# Patient Record
Sex: Female | Born: 1935
Health system: Southern US, Community
[De-identification: ages and names within clinical notes are randomized; demographics above are authoritative.]

## PROBLEM LIST (undated history)

## (undated) DIAGNOSIS — E785 Hyperlipidemia, unspecified: Secondary | ICD-10-CM

## (undated) DIAGNOSIS — M109 Gout, unspecified: Secondary | ICD-10-CM

## (undated) DIAGNOSIS — I10 Essential (primary) hypertension: Secondary | ICD-10-CM

## (undated) DIAGNOSIS — Z8619 Personal history of other infectious and parasitic diseases: Secondary | ICD-10-CM

## (undated) DIAGNOSIS — E119 Type 2 diabetes mellitus without complications: Secondary | ICD-10-CM

## (undated) HISTORY — DX: Gout, unspecified: M10.9

## (undated) HISTORY — DX: Hyperlipidemia, unspecified: E78.5

## (undated) HISTORY — DX: Essential (primary) hypertension: I10

## (undated) HISTORY — PX: TUBAL LIGATION: SHX77

## (undated) HISTORY — PX: CATARACT EXTRACTION, BILATERAL: SHX1313

## (undated) HISTORY — PX: CARPAL TUNNEL RELEASE: SHX101

## (undated) HISTORY — DX: Personal history of other infectious and parasitic diseases: Z86.19

---

## 2005-05-29 ENCOUNTER — Ambulatory Visit: Payer: Self-pay | Admitting: *Deleted

## 2005-05-31 ENCOUNTER — Ambulatory Visit: Payer: Self-pay | Admitting: *Deleted

## 2005-06-05 ENCOUNTER — Ambulatory Visit (HOSPITAL_COMMUNITY): Admission: RE | Admit: 2005-06-05 | Discharge: 2005-06-05 | Payer: Self-pay | Admitting: *Deleted

## 2005-08-08 ENCOUNTER — Ambulatory Visit: Payer: Self-pay | Admitting: *Deleted

## 2005-12-27 ENCOUNTER — Ambulatory Visit: Payer: Self-pay | Admitting: *Deleted

## 2006-02-19 ENCOUNTER — Ambulatory Visit: Payer: Self-pay | Admitting: *Deleted

## 2006-06-17 ENCOUNTER — Inpatient Hospital Stay: Payer: Self-pay | Admitting: Internal Medicine

## 2006-06-17 ENCOUNTER — Other Ambulatory Visit: Payer: Self-pay

## 2006-06-26 ENCOUNTER — Ambulatory Visit: Payer: Self-pay | Admitting: Internal Medicine

## 2008-01-15 DIAGNOSIS — M109 Gout, unspecified: Secondary | ICD-10-CM | POA: Insufficient documentation

## 2008-01-15 DIAGNOSIS — I10 Essential (primary) hypertension: Secondary | ICD-10-CM | POA: Insufficient documentation

## 2008-05-12 ENCOUNTER — Ambulatory Visit: Payer: Self-pay | Admitting: Family Medicine

## 2008-05-12 DIAGNOSIS — M461 Sacroiliitis, not elsewhere classified: Secondary | ICD-10-CM | POA: Insufficient documentation

## 2008-05-12 LAB — HM DEXA SCAN

## 2008-12-11 HISTORY — PX: OTHER SURGICAL HISTORY: SHX169

## 2008-12-21 ENCOUNTER — Ambulatory Visit: Payer: Self-pay | Admitting: Family Medicine

## 2009-06-30 ENCOUNTER — Ambulatory Visit: Payer: Self-pay | Admitting: Family Medicine

## 2010-03-18 DIAGNOSIS — J309 Allergic rhinitis, unspecified: Secondary | ICD-10-CM | POA: Insufficient documentation

## 2011-01-04 ENCOUNTER — Ambulatory Visit: Payer: Self-pay | Admitting: Family Medicine

## 2011-01-08 HISTORY — PX: OTHER SURGICAL HISTORY: SHX169

## 2012-04-17 ENCOUNTER — Emergency Department: Payer: Self-pay | Admitting: Emergency Medicine

## 2012-04-17 LAB — URINALYSIS, COMPLETE
Glucose,UR: NEGATIVE mg/dL (ref 0–75)
Ketone: NEGATIVE
Leukocyte Esterase: NEGATIVE
Nitrite: NEGATIVE
Protein: NEGATIVE
Squamous Epithelial: <1
WBC UR: 1 /HPF (ref 0–5)

## 2015-03-03 DIAGNOSIS — M109 Gout, unspecified: Secondary | ICD-10-CM | POA: Diagnosis not present

## 2015-03-03 DIAGNOSIS — I10 Essential (primary) hypertension: Secondary | ICD-10-CM | POA: Diagnosis not present

## 2015-03-03 DIAGNOSIS — M792 Neuralgia and neuritis, unspecified: Secondary | ICD-10-CM | POA: Diagnosis not present

## 2015-03-03 DIAGNOSIS — E782 Mixed hyperlipidemia: Secondary | ICD-10-CM | POA: Diagnosis not present

## 2015-03-09 DIAGNOSIS — I10 Essential (primary) hypertension: Secondary | ICD-10-CM | POA: Diagnosis not present

## 2015-03-09 DIAGNOSIS — E782 Mixed hyperlipidemia: Secondary | ICD-10-CM | POA: Diagnosis not present

## 2015-03-09 DIAGNOSIS — M109 Gout, unspecified: Secondary | ICD-10-CM | POA: Diagnosis not present

## 2015-03-09 LAB — BASIC METABOLIC PANEL
BUN: 22 mg/dL — AB (ref 4–21)
Creatinine: 1 mg/dL (ref <=1.1)
Glucose: 138 mg/dL
Potassium: 3.5 mmol/L (ref 3.4–5.3)
SODIUM: 143 mmol/L (ref 137–147)

## 2015-03-09 LAB — LIPID PANEL
CHOLESTEROL: 165 mg/dL (ref 0–200)
HDL: 34 mg/dL — AB (ref 35–70)
LDL Cholesterol: 68 mg/dL
Triglycerides: 315 mg/dL — AB (ref 40–160)

## 2015-03-09 LAB — HEPATIC FUNCTION PANEL
ALT: 31 U/L (ref 7–35)
AST: 20 U/L (ref 13–35)

## 2015-06-18 DIAGNOSIS — F4321 Adjustment disorder with depressed mood: Secondary | ICD-10-CM | POA: Insufficient documentation

## 2015-06-18 DIAGNOSIS — M545 Low back pain, unspecified: Secondary | ICD-10-CM | POA: Insufficient documentation

## 2015-06-18 DIAGNOSIS — E876 Hypokalemia: Secondary | ICD-10-CM | POA: Insufficient documentation

## 2015-06-18 DIAGNOSIS — E1140 Type 2 diabetes mellitus with diabetic neuropathy, unspecified: Secondary | ICD-10-CM | POA: Insufficient documentation

## 2015-06-18 DIAGNOSIS — M792 Neuralgia and neuritis, unspecified: Secondary | ICD-10-CM | POA: Insufficient documentation

## 2015-06-22 ENCOUNTER — Encounter: Payer: Self-pay | Admitting: Family Medicine

## 2015-06-22 ENCOUNTER — Ambulatory Visit (INDEPENDENT_AMBULATORY_CARE_PROVIDER_SITE_OTHER): Payer: Commercial Managed Care - HMO | Admitting: Family Medicine

## 2015-06-22 VITALS — BP 124/78 | HR 76 | Temp 98.2°F | Resp 16 | Ht 66.0 in | Wt 176.0 lb

## 2015-06-22 DIAGNOSIS — R739 Hyperglycemia, unspecified: Secondary | ICD-10-CM | POA: Diagnosis not present

## 2015-06-22 DIAGNOSIS — M1000 Idiopathic gout, unspecified site: Secondary | ICD-10-CM | POA: Diagnosis not present

## 2015-06-22 DIAGNOSIS — I10 Essential (primary) hypertension: Secondary | ICD-10-CM | POA: Diagnosis not present

## 2015-06-22 DIAGNOSIS — M8580 Other specified disorders of bone density and structure, unspecified site: Secondary | ICD-10-CM | POA: Diagnosis not present

## 2015-06-22 DIAGNOSIS — R609 Edema, unspecified: Secondary | ICD-10-CM

## 2015-06-22 LAB — POCT GLYCOSYLATED HEMOGLOBIN (HGB A1C)
ESTIMATED AVERAGE GLUCOSE: 128
Hemoglobin A1C: 6.1

## 2015-06-22 MED ORDER — COLCHICINE 0.6 MG PO TABS: ORAL_TABLET | ORAL | Status: DC

## 2015-06-22 MED ORDER — METOPROLOL SUCCINATE ER 50 MG PO TB24: 50.0000 mg | ORAL_TABLET | Freq: Every day | ORAL | Status: DC

## 2015-06-22 NOTE — Progress Notes (Signed)
Patient: Teresa Shields Female    DOB: [birth year]   79 y.o.   MRN: [id number] Visit Date: 06/22/2015  Today's Provider: Mila Merryonald Lyrick Lagrand, MD   Chief Complaint  Patient presents with  . Hyperglycemia  . Hypertension  . Gout   Subjective:    HPI      Hypertension, follow-up:  BP Readings from Last 3 Encounters:  06/22/15 124/78  06/18/15 138/70    She was last seen for hypertension 3 months ago.  BP at that visit was 138/70. Management changes since that visit include increasing Triamterene/HCTZ to 1 full pill per day. She reports good compliance with treatment. She is not having side effects.  She is exercising (Walking). She is adherent to low salt diet.   Outside blood pressures are 110's-130's/70's-80's. She is experiencing lower extremity edema.  Patient denies chest pain, chest pressure/discomfort, claudication, dyspnea, exertional chest pressure/discomfort, fatigue, irregular heart beat, near-syncope, orthopnea, palpitations, syncope and tachypnea.   Cardiovascular risk factors include advanced age (older than 455 for men, 4765 for women), dyslipidemia and hypertension.  Use of agents associated with hypertension: none.     Weight trend: stable Wt Readings from Last 3 Encounters:  06/22/15 176 lb (79.833 kg)  06/18/15 174 lb (78.926 kg)    Current diet: in general, a "healthy" diet    ------------------------------------------------------------------------  Follow up Hyperglycemia  The patient was last seen for this 3 months ago. Changes made at last visit include advising pt to avoid starchy foods and sweets. Pt's glucose was elevated at 138. Will check HgbA1C today.  ------------------------------------------------------------------------------------ Gout:  Patient here for evaluation of acute gouty arthritis. The patient reports 3 attacks involving the right foot. Attacks occur primarily in the right foot. Patient reports her chronic pain is  unchanged, her joint stiffness is unchanged and her joint swelling is unchanged. Limitation on activities include difficulty with walking. The patient is avoiding high purine foods and reports consuming 0 alcoholic drinks per month. Pt reports she has about 3 gout attacks since increasing her diuretic.     Allergies  Allergen Reactions  . Naproxen Diarrhea   Previous Medications   ALLOPURINOL (ZYLOPRIM) 300 MG TABLET    Take 1 tablet by mouth daily.   ASCORBIC ACID (VITAMIN C) 100 MG TABLET    Take 1 tablet by mouth daily.   ASPIRIN 81 MG TABLET    Take 1 tablet by mouth daily.   ATORVASTATIN (LIPITOR) 40 MG TABLET    Take 1 tablet by mouth at bedtime.   CALCIUM-VITAMIN D (OSCAL 500/200 D-3) 500-200 MG-UNIT PER TABLET    Take 2 tablets by mouth daily.   GABAPENTIN (NEURONTIN) 100 MG CAPSULE    Take 1 capsule by mouth at bedtime.   METOPROLOL SUCCINATE (TOPROL XL) 50 MG 24 HR TABLET    Take 1 tablet by mouth daily. For blood pressure   MULTIPLE VITAMIN PO    Take 1 tablet by mouth daily.   TRIAMTERENE-HYDROCHLOROTHIAZIDE (MAXZIDE) 75-50 MG PER TABLET    Take 1 tablet by mouth daily.   VITAMIN E 400 UNITS TABS    Take 1 tablet by mouth daily.    Review of Systems  Constitutional: Negative for fever, chills, diaphoresis, activity change, appetite change, fatigue and unexpected weight change.  Respiratory: Negative for cough, chest tightness, shortness of breath and wheezing.   Cardiovascular: Positive for leg swelling. Negative for chest pain and palpitations.  Endocrine: Negative for polydipsia, polyphagia and polyuria.  History  Substance Use Topics  . Smoking status: Never Smoker   . Smokeless tobacco: Never Used  . Alcohol Use: No   Objective:   BP 124/78 mmHg  Pulse 76  Temp(Src) 98.2 F (36.8 C) (Oral)  Resp 16  Ht  (1.676 m)  Wt 176 lb (79.833 kg)  BMI 28.42 kg/m2  Physical Exam   General Appearance:    Alert, cooperative, no distress  Eyes:    PERRL,  conjunctiva/corneas clear, EOM's intact       Lungs:     Clear to auscultation bilaterally, respirations unlabored  Heart:    Regular rate and rhythm  Neurologic:   Awake, alert, oriented x 3. No apparent focal neurological           defect.       Results for orders placed or performed in visit on 06/22/15  POCT glycosylated hemoglobin (Hb A1C)  Result Value Ref Range   Hemoglobin A1C 6.1    Est. average glucose Bld gHb Est-mCnc 128        Assessment & Plan:      1. Hypertension, essential, benign Well controlled on current medications.  - metoprolol succinate (TOPROL XL) 50 MG 24 hr tablet; Take 1 tablet (50 mg total) by mouth daily. For blood pressure  Dispense: 90 tablet; Refill: 3  2. Hyperglycemia well controlled . - POCT glycosylated hemoglobin (Hb A1C)  3. Edema Improved since increasing diuretic.   4. Osteopenia   5. Idiopathic gout, unspecified chronicity, unspecified site A few minor flares since increasing diuretic. Continue allopurinol.  - colchicine 0.6 MG tablet; 2 tablets at first sign of gout, then one daily as needed  Dispense: 90 tablet; Refill: 1    Mila Merry, MD  [facility]

## 2015-06-27 ENCOUNTER — Encounter: Payer: Self-pay | Admitting: Family Medicine

## 2015-06-27 DIAGNOSIS — M8580 Other specified disorders of bone density and structure, unspecified site: Secondary | ICD-10-CM | POA: Insufficient documentation

## 2015-08-12 ENCOUNTER — Telehealth: Payer: Self-pay | Admitting: Family Medicine

## 2015-08-12 MED ORDER — GABAPENTIN 100 MG PO CAPS: 100.0000 mg | ORAL_CAPSULE | Freq: Every day | ORAL | Status: DC

## 2015-08-12 NOTE — Telephone Encounter (Signed)
rx refill only

## 2015-09-30 ENCOUNTER — Ambulatory Visit (INDEPENDENT_AMBULATORY_CARE_PROVIDER_SITE_OTHER): Payer: Commercial Managed Care - HMO

## 2015-09-30 DIAGNOSIS — Z23 Encounter for immunization: Secondary | ICD-10-CM

## 2015-12-29 ENCOUNTER — Ambulatory Visit (INDEPENDENT_AMBULATORY_CARE_PROVIDER_SITE_OTHER): Payer: Commercial Managed Care - HMO | Admitting: Family Medicine

## 2015-12-29 ENCOUNTER — Encounter: Payer: Self-pay | Admitting: Family Medicine

## 2015-12-29 VITALS — BP 146/60 | HR 76 | Temp 98.2°F | Resp 16 | Ht 66.0 in | Wt 177.0 lb

## 2015-12-29 DIAGNOSIS — I10 Essential (primary) hypertension: Secondary | ICD-10-CM

## 2015-12-29 DIAGNOSIS — M109 Gout, unspecified: Secondary | ICD-10-CM

## 2015-12-29 DIAGNOSIS — E2839 Other primary ovarian failure: Secondary | ICD-10-CM

## 2015-12-29 DIAGNOSIS — M10079 Idiopathic gout, unspecified ankle and foot: Secondary | ICD-10-CM

## 2015-12-29 DIAGNOSIS — R739 Hyperglycemia, unspecified: Secondary | ICD-10-CM

## 2015-12-29 LAB — POCT GLYCOSYLATED HEMOGLOBIN (HGB A1C)
Est. average glucose Bld gHb Est-mCnc: 134
Hemoglobin A1C: 6.3

## 2015-12-29 MED ORDER — INDOMETHACIN 25 MG PO CAPS: 25.0000 mg | ORAL_CAPSULE | Freq: Two times a day (BID) | ORAL | Status: DC

## 2015-12-29 NOTE — Progress Notes (Signed)
Patient: Teresa Shields Female    DOB: [birth year]   80 y.o.   MRN: [id number] Visit Date: 12/29/2015  Today's Provider: Mila Merry, MD   Chief Complaint  Patient presents with  . Hypertension    follow up  . Hyperglycemia    follow up  . Gout    follow up   Subjective:    HPI   Hypertension, follow-up:  BP Readings from Last 3 Encounters:  06/22/15 124/78  06/18/15 138/70    She was last seen for hypertension 6 months ago.  BP at that visit was  124/78. Management since that visit includes no changes. She reports good compliance with treatment. She is not having side effects.  She is exercising. Walking. She is adherent to low salt diet.   Outside blood pressures are 130's/ 60-70's. She is experiencing lower extremity edema.  Patient denies chest pain, chest pressure/discomfort, claudication, dyspnea, exertional chest pressure/discomfort, fatigue, irregular heart beat, orthopnea, palpitations, paroxysmal nocturnal dyspnea, syncope and tachypnea.   Cardiovascular risk factors include advanced age (older than 35 for men, 53 for women) and hypertension.  Use of agents associated with hypertension: NSAIDS.     Weight trend: stable Wt Readings from Last 3 Encounters:  06/22/15 176 lb (79.833 kg)  06/18/15 174 lb (78.926 kg)    Current diet: unhealthy diet  ------------------------------------------------------------------------   Hyperglycemia, Follow-up:   Lab Results  Component Value Date   HGBA1C 6.1 06/22/2015    Last seen for for this 6 months ago.  Management since then includes no changes. Current symptoms include none and have been stable.  Weight trend: stable Prior visit with dietician: no Current diet: in general, an "unhealthy" diet Current exercise: walking  Pertinent Labs:    Component Value Date/Time   CHOL 165 03/09/2015   TRIG 315* 03/09/2015   CREATININE 1.0 03/09/2015    Wt Readings from Last 3 Encounters:  06/22/15  176 lb (79.833 kg)  06/18/15 174 lb (78.926 kg)    Follow up Gout:  Last office visit was 6 months ago and no changes were made. Patient was to continue Colchicine. Patient comes in today stating she has had a gout flare up for the past 3 weeks. Patient states she has swelling in her right lower leg. There was pain in her leg at the onset of the gout flare which has now resolved. Patient has been taking the medications as prescribed. She feels the Colchine is not helping to control gout and also states it is not covered by her insurance.       Allergies  Allergen Reactions  . Naproxen Diarrhea   Previous Medications   ALLOPURINOL (ZYLOPRIM) 300 MG TABLET    Take 1 tablet by mouth daily.   ASCORBIC ACID (VITAMIN C) 100 MG TABLET    Take 1 tablet by mouth daily.   ASPIRIN 81 MG TABLET    Take 1 tablet by mouth daily.   ATORVASTATIN (LIPITOR) 40 MG TABLET    Take 1 tablet by mouth at bedtime.   CALCIUM-VITAMIN D (OSCAL 500/200 D-3) 500-200 MG-UNIT PER TABLET    Take 2 tablets by mouth daily.   COLCHICINE 0.6 MG TABLET    2 tablets at first sign of gout, then one daily as needed   GABAPENTIN (NEURONTIN) 100 MG CAPSULE    Take 1 capsule (100 mg total) by mouth at bedtime.   METOPROLOL SUCCINATE (TOPROL XL) 50 MG 24 HR TABLET  Take 1 tablet (50 mg total) by mouth daily. For blood pressure   MULTIPLE VITAMIN PO    Take 1 tablet by mouth daily.   TRIAMTERENE-HYDROCHLOROTHIAZIDE (MAXZIDE) 75-50 MG PER TABLET    Take 1 tablet by mouth daily.   VITAMIN E 400 UNITS TABS    Take 1 tablet by mouth daily.    Review of Systems  Constitutional: Negative for fever, chills, appetite change and fatigue.  Respiratory: Negative for chest tightness and shortness of breath.   Cardiovascular: Positive for leg swelling. Negative for chest pain and palpitations.  Gastrointestinal: Negative for nausea, vomiting and abdominal pain.  Endocrine: Negative for cold intolerance, heat intolerance, polydipsia,  polyphagia and polyuria.  Neurological: Negative for dizziness and weakness.    Social History  Substance Use Topics  . Smoking status: Never Smoker   . Smokeless tobacco: Never Used  . Alcohol Use: No   Objective:   BP 146/60 mmHg  Pulse 76  Temp(Src) 98.2 F (36.8 C) (Oral)  Resp 16  Ht  (1.676 m)  Wt 177 lb (80.287 kg)  BMI 28.58 kg/m2  SpO2 96%  Physical Exam   General Appearance:    Alert, cooperative, no distress  Eyes:    PERRL, conjunctiva/corneas clear, EOM's intact       Lungs:     Clear to auscultation bilaterally, respirations unlabored  Heart:    Regular rate and rhythm  Neurologic:   Awake, alert, oriented x 3. No apparent focal neurological           defect.       Results for orders placed or performed in visit on 12/29/15  POCT HgB A1C  Result Value Ref Range   Hemoglobin A1C 6.3    Est. average glucose Bld gHb Est-mCnc 134        Assessment & Plan:     1. Hyperglycem9a Well controlled.  Continue current medications.   - POCT HgB A1C  2. Hypertension, essential, benign Well controlled.  Continue current medications.    3. Gout of foot, unspecified cause, unspecified chronicity, unspecified laterality  - indomethacin (INDOCIN) 25 MG capsule; Take 1-2 capsules (25-50 mg total) by mouth 2 (two) times daily with a meal. For gout  Dispense: 30 capsule; Refill: 1  Consider ortho referral if not rapidly improving  4. Estrogen deficiency  - DG Bone Density; Future       Mila Merry, MD  [facility]

## 2016-01-12 ENCOUNTER — Other Ambulatory Visit: Payer: Self-pay | Admitting: Family Medicine

## 2016-01-25 ENCOUNTER — Ambulatory Visit (INDEPENDENT_AMBULATORY_CARE_PROVIDER_SITE_OTHER): Payer: Commercial Managed Care - HMO | Admitting: Family Medicine

## 2016-01-25 ENCOUNTER — Encounter: Payer: Self-pay | Admitting: Family Medicine

## 2016-01-25 VITALS — BP 112/52 | HR 84 | Temp 98.0°F | Resp 18 | Wt 175.0 lb

## 2016-01-25 DIAGNOSIS — J069 Acute upper respiratory infection, unspecified: Secondary | ICD-10-CM

## 2016-01-25 MED ORDER — AZITHROMYCIN 250 MG PO TABS: ORAL_TABLET | ORAL | Status: AC

## 2016-01-25 NOTE — Patient Instructions (Addendum)
Take OTC Mucinex (guaifenesin) and Delsym for cough and  chest congestion  Start prescription for azithromycin if you develop fever above 101, feel short of breath, or if not getting better within 7 days.     Upper Respiratory Infection, Adult Most upper respiratory infections (URIs) are a viral infection of the air passages leading to the lungs. A URI affects the nose, throat, and upper air passages. The most common type of URI is nasopharyngitis and is typically referred to as "the common cold." URIs run their course and usually go away on their own. Most of the time, a URI does not require medical attention, but sometimes a bacterial infection in the upper airways can follow a viral infection. This is called a secondary infection. Sinus and middle ear infections are common types of secondary upper respiratory infections. Bacterial pneumonia can also complicate a URI. A URI can worsen asthma and chronic obstructive pulmonary disease (COPD). Sometimes, these complications can require emergency medical care and may be life threatening.  CAUSES Almost all URIs are caused by viruses. A virus is a type of germ and can spread from one person to another.  RISKS FACTORS You may be at risk for a URI if:   You smoke.   You have chronic heart or lung disease.  You have a weakened defense (immune) system.   You are very young or very old.   You have nasal allergies or asthma.  You work in crowded or poorly ventilated areas.  You work in health care facilities or schools. SIGNS AND SYMPTOMS  Symptoms typically develop 2-3 days after you come in contact with a cold virus. Most viral URIs last 7-10 days. However, viral URIs from the influenza virus (flu virus) can last 14-18 days and are typically more severe. Symptoms may include:   Runny or stuffy (congested) nose.   Sneezing.   Cough.   Sore throat.   Headache.   Fatigue.   Fever.   Loss of appetite.   Pain in your  forehead, behind your eyes, and over your cheekbones (sinus pain).  Muscle aches.  DIAGNOSIS  Your health care provider may diagnose a URI by:  Physical exam.  Tests to check that your symptoms are not due to another condition such as:  Strep throat.  Sinusitis.  Pneumonia.  Asthma. TREATMENT  A URI goes away on its own with time. It cannot be cured with medicines, but medicines may be prescribed or recommended to relieve symptoms. Medicines may help:  Reduce your fever.  Reduce your cough.  Relieve nasal congestion. HOME CARE INSTRUCTIONS   Take medicines only as directed by your health care provider.   Gargle warm saltwater or take cough drops to comfort your throat as directed by your health care provider.  Use a warm mist humidifier or inhale steam from a shower to increase air moisture. This may make it easier to breathe.  Drink enough fluid to keep your urine clear or pale yellow.   Eat soups and other clear broths and maintain good nutrition.   Rest as needed.   Return to work when your temperature has returned to normal or as your health care provider advises. You may need to stay home longer to avoid infecting others. You can also use a face mask and careful hand washing to prevent spread of the virus.  Increase the usage of your inhaler if you have asthma.   Do not use any tobacco products, including cigarettes, chewing tobacco, or electronic  cigarettes. If you need help quitting, ask your health care provider. PREVENTION  The best way to protect yourself from getting a cold is to practice good hygiene.   Avoid oral or hand contact with people with cold symptoms.   Wash your hands often if contact occurs.  There is no clear evidence that vitamin C, vitamin E, echinacea, or exercise reduces the chance of developing a cold. However, it is always recommended to get plenty of rest, exercise, and practice good nutrition.  SEEK MEDICAL CARE IF:   You  are getting worse rather than better.   Your symptoms are not controlled by medicine.   You have chills.  You have worsening shortness of breath.  You have brown or red mucus.  You have yellow or brown nasal discharge.  You have pain in your face, especially when you bend forward.  You have a fever.  You have swollen neck glands.  You have pain while swallowing.  You have white areas in the back of your throat. SEEK IMMEDIATE MEDICAL CARE IF:   You have severe or persistent:  Headache.  Ear pain.  Sinus pain.  Chest pain.  You have chronic lung disease and any of the following:  Wheezing.  Prolonged cough.  Coughing up blood.  A change in your usual mucus.  You have a stiff neck.  You have changes in your:  Vision.  Hearing.  Thinking.  Mood. MAKE SURE YOU:   Understand these instructions.  Will watch your condition.  Will get help right away if you are not doing well or get worse.   This information is not intended to replace advice given to you by your health care provider. Make sure you discuss any questions you have with your health care provider.   Document Released: 05/23/2001 Document Revised: 04/13/2015 Document Reviewed: 03/04/2014 Elsevier Interactive Patient Education [employer].

## 2016-01-25 NOTE — Progress Notes (Signed)
Patient: Teresa Shields Female    DOB: Feb 09, 1936   80 y.o.   MRN: [id number] Visit Date: 01/25/2016  Today's Provider: Mila Merry, MD   Chief Complaint  Patient presents with  . Cough   Subjective:    Cough This is a new problem. Episode onset: 2 days ago. The problem has been gradually worsening. The problem occurs constantly. The cough is non-productive. Associated symptoms include chills, rhinorrhea, shortness of breath and wheezing. Pertinent negatives include no chest pain, ear congestion, ear pain, eye redness, fever, headaches, heartburn, myalgias, nasal congestion, postnasal drip, rash, sore throat or sweats. Nothing aggravates the symptoms. Treatments tried: CorIcidin HBP. The treatment provided no relief.       Allergies  Allergen Reactions  . Naproxen Diarrhea   Previous Medications   ALLOPURINOL (ZYLOPRIM) 300 MG TABLET    Take 1 tablet by mouth daily.   ASCORBIC ACID (VITAMIN C) 100 MG TABLET    Take 1 tablet by mouth daily.   ASPIRIN 81 MG TABLET    Take 1 tablet by mouth daily.   ATORVASTATIN (LIPITOR) 40 MG TABLET    Take 1 tablet by mouth at bedtime.   CALCIUM-VITAMIN D (OSCAL 500/200 D-3) 500-200 MG-UNIT PER TABLET    Take 2 tablets by mouth daily.   COLCHICINE 0.6 MG TABLET    2 tablets at first sign of gout, then one daily as needed   GABAPENTIN (NEURONTIN) 100 MG CAPSULE    Take 1 capsule (100 mg total) by mouth at bedtime.   INDOMETHACIN (INDOCIN) 25 MG CAPSULE    Take 1-2 capsules (25-50 mg total) by mouth 2 (two) times daily with a meal. For gout   METOPROLOL SUCCINATE (TOPROL XL) 50 MG 24 HR TABLET    Take 1 tablet (50 mg total) by mouth daily. For blood pressure   MULTIPLE VITAMIN PO    Take 1 tablet by mouth daily.   TRIAMTERENE-HYDROCHLOROTHIAZIDE (MAXZIDE) 75-50 MG TABLET    TAKE 1 TABLET EVERY DAY   VITAMIN E 400 UNITS TABS    Take 1 tablet by mouth daily.    Review of Systems  Constitutional: Positive for chills and fatigue. Negative  for fever and appetite change.  HENT: Positive for congestion and rhinorrhea. Negative for ear discharge, ear pain, mouth sores, nosebleeds, postnasal drip, sinus pressure, sneezing and sore throat.   Eyes: Negative for photophobia, pain, discharge, redness, itching and visual disturbance.  Respiratory: Positive for cough, shortness of breath and wheezing. Negative for chest tightness.   Cardiovascular: Negative for chest pain and palpitations.  Gastrointestinal: Negative for heartburn, nausea, vomiting and abdominal pain.  Musculoskeletal: Negative for myalgias.  Skin: Negative for rash.  Neurological: Positive for weakness and light-headedness. Negative for dizziness and headaches.    Social History  Substance Use Topics  . Smoking status: Never Smoker   . Smokeless tobacco: Never Used  . Alcohol Use: No   Objective:   BP 112/52 mmHg  Pulse 84  Temp(Src) 98 F (36.7 C) (Oral)  Resp 18  Wt 175 lb (79.379 kg)  SpO2 98%  Physical Exam   General Appearance:    Alert, cooperative, no distress  HENT:   bilateral TM normal without fluid or infection, neck without nodes, throat normal without erythema or exudate, sinuses nontender and nasal mucosa pale and congested  Eyes:    PERRL, conjunctiva/corneas clear, EOM's intact       Lungs:     Clear to auscultation  bilaterally, respirations unlabored  Heart:    Regular rate and rhythm  Neurologic:   Awake, alert, oriented x 3. No apparent focal neurological           defect.            Assessment & Plan:     1. Upper respiratory infection Counseled regarding signs and symptoms of viral and bacterial respiratory infections. Advised to fill prescription for Azithromycin if she develops any sign of bacterial infection, or if current symptoms last longer than 10 days.         Mila Merry, MD  [facility]

## 2016-02-07 ENCOUNTER — Other Ambulatory Visit: Payer: Self-pay | Admitting: Family Medicine

## 2016-02-08 ENCOUNTER — Ambulatory Visit: Payer: Self-pay

## 2016-03-25 ENCOUNTER — Other Ambulatory Visit: Payer: Self-pay | Admitting: Family Medicine

## 2016-06-12 ENCOUNTER — Other Ambulatory Visit: Payer: Self-pay | Admitting: Family Medicine

## 2016-06-28 ENCOUNTER — Ambulatory Visit: Payer: Commercial Managed Care - HMO | Admitting: Family Medicine

## 2016-07-04 ENCOUNTER — Ambulatory Visit (INDEPENDENT_AMBULATORY_CARE_PROVIDER_SITE_OTHER): Payer: Commercial Managed Care - HMO | Admitting: Family Medicine

## 2016-07-04 ENCOUNTER — Encounter: Payer: Self-pay | Admitting: Family Medicine

## 2016-07-04 VITALS — BP 120/64 | HR 74 | Temp 97.7°F | Resp 16 | Ht 66.0 in | Wt 174.0 lb

## 2016-07-04 DIAGNOSIS — M2550 Pain in unspecified joint: Secondary | ICD-10-CM

## 2016-07-04 DIAGNOSIS — R739 Hyperglycemia, unspecified: Secondary | ICD-10-CM

## 2016-07-04 DIAGNOSIS — M10079 Idiopathic gout, unspecified ankle and foot: Secondary | ICD-10-CM | POA: Diagnosis not present

## 2016-07-04 DIAGNOSIS — M109 Gout, unspecified: Secondary | ICD-10-CM

## 2016-07-04 DIAGNOSIS — R600 Localized edema: Secondary | ICD-10-CM

## 2016-07-04 DIAGNOSIS — I10 Essential (primary) hypertension: Secondary | ICD-10-CM

## 2016-07-04 DIAGNOSIS — R609 Edema, unspecified: Secondary | ICD-10-CM | POA: Insufficient documentation

## 2016-07-04 LAB — POCT GLYCOSYLATED HEMOGLOBIN (HGB A1C)
Est. average glucose Bld gHb Est-mCnc: 140
Hemoglobin A1C: 6.5

## 2016-07-04 NOTE — Progress Notes (Signed)
Patient: Teresa Shields Female    DOB: [birth year]   80 y.o.   MRN: [id number] Visit Date: 07/04/2016  Today's Provider: Lelon Huh, MD   Chief Complaint  Patient presents with  . Follow-up  . Hypertension  . Gout  . Hyperglycemia   Subjective:    HPI  Estrogen deficiency: From 12/29/2015-DG Bone Density; ordered. No report in epic that patient had this done.  Gout of foot, unspecified cause, unspecified chronicity, unspecified laterality: From 12/29/2015-started indomethacin (INDOCIN) 25 MG capsule. States has been having gout flares about every 3 weeks.   Hyperglycemia: From 12/29/2015-Well controlled.  Continue current medications.      Hypertension, follow-up:  BP Readings from Last 3 Encounters:  07/04/16 120/64  01/25/16 (!) 112/52  12/29/15 (!) 146/60    She was last seen for hypertension 6 months ago.  BP at that visit was 146/60. Management since that visit includes; no changes.She reports good compliance with treatment. She is not having side effects. none She is exercising. She is adherent to low salt diet.   Outside blood pressures are 106/70. She is experiencing none.  Patient denies none.   Cardiovascular risk factors include none.  Use of agents associated with hypertension: none.   ----------------------------------------------------------------    Bilateral hand pain for 1 1/2 months. Hands feel oily all the time. Has a hard time opening things with her hands due to pain with tight gripping. No swelling.    Allergies  Allergen Reactions  . Naproxen Diarrhea   Current Meds  Medication Sig  . allopurinol (ZYLOPRIM) 300 MG tablet TAKE 1 TABLET EVERY DAY  . Ascorbic Acid (VITAMIN C) 100 MG tablet Take 1 tablet by mouth daily.  Marland Kitchen aspirin 81 MG tablet Take 1 tablet by mouth daily.  Marland Kitchen atorvastatin (LIPITOR) 40 MG tablet TAKE 1 TABLET AT BEDTIME FOR CHOLESTEROL  . calcium-vitamin D (OSCAL 500/200 D-3) 500-200 MG-UNIT per tablet Take 2  tablets by mouth daily.  . colchicine 0.6 MG tablet 2 tablets at first sign of gout, then one daily as needed  . gabapentin (NEURONTIN) 100 MG capsule Take 1 capsule (100 mg total) by mouth at bedtime.  . indomethacin (INDOCIN) 25 MG capsule Take 1-2 capsules (25-50 mg total) by mouth 2 (two) times daily with a meal. For gout  . metoprolol succinate (TOPROL-XL) 50 MG 24 hr tablet TAKE 1 TABLET EVERY DAY FOR BLOOD PRESSURE  . MULTIPLE VITAMIN PO Take 1 tablet by mouth daily.  Marland Kitchen triamterene-hydrochlorothiazide (MAXZIDE) 75-50 MG tablet TAKE 1 TABLET EVERY DAY  . Vitamin E 400 UNITS TABS Take 1 tablet by mouth daily.    Review of Systems  Constitutional: Negative for appetite change, chills, fatigue and fever.  Respiratory: Negative for chest tightness and shortness of breath.   Cardiovascular: Negative for chest pain and palpitations.  Gastrointestinal: Negative for abdominal pain, nausea and vomiting.  Neurological: Negative for dizziness and weakness.    Social History  Substance Use Topics  . Smoking status: Never Smoker  . Smokeless tobacco: Never Used  . Alcohol use No   Objective:   BP 120/64 (BP Location: Left Arm, Patient Position: Sitting, Cuff Size: Large)   Pulse 74   Temp 97.7 F (36.5 C) (Oral)   Resp 16   Ht 5' 6" (1.676 m)   Wt 174 lb (78.9 kg)   SpO2 98%   BMI 28.08 kg/m   Physical Exam   General Appearance:    Alert, cooperative,  no distress  Eyes:    PERRL, conjunctiva/corneas clear, EOM's intact       Lungs:     Clear to auscultation bilaterally, respirations unlabored  Heart:    Regular rate and rhythm. Trace bipedal edema  Neurologic:   Awake, alert, oriented x 3. No apparent focal neurological           defect.   MS:     Slight tenderness palmer aspect fingers and hand. No swelling. No erythema. No other gross deformities.      Results for orders placed or performed in visit on 07/04/16  POCT glycosylated hemoglobin (Hb A1C)  Result Value Ref  Range   Hemoglobin A1C 6.5    Est. average glucose Bld gHb Est-mCnc 140        Assessment & Plan:     1. Hyperglycemia Borderline diabetes. Continue avoiding sweets in diet and check a1c 2-3 times a year.  - POCT glycosylated hemoglobin (Hb A1C)  2. Hypertension, essential, benign Well controlled.  Consider stopping maxzide which is likely increasing uric acid levels.  - Renal function panel  3. Arthralgia  - Sed Rate (ESR) - ANA w/Reflex - Rheumatoid factor  4. Gout of foot, unspecified cause, unspecified chronicity, unspecified laterality Persistent. If uric acid levels up will likely benefit from stopping maxzide - Uric acid  5. Edema Well controlled on Maxzide, but this medication is likely aggravating gout. Consider change to prn furosemide.     The entirety of the information documented in the History of Present Illness, Review of Systems and Physical Exam were personally obtained by me. Portions of this information were initially documented by Roshena Chambers, CMA and reviewed by me for thoroughness and accuracy.     , MD  [facility]  

## 2016-07-05 LAB — RENAL FUNCTION PANEL
Albumin: 4.3 g/dL (ref 3.5–4.8)
BUN / CREAT RATIO: 18 (ref 12–28)
BUN: 18 mg/dL (ref 8–27)
CALCIUM: 9.7 mg/dL (ref 8.7–10.3)
CO2: 26 mmol/L (ref 18–29)
CREATININE: 1.02 mg/dL — AB (ref 0.57–1.00)
Chloride: 99 mmol/L (ref 96–106)
GFR, EST AFRICAN AMERICAN: 61 mL/min/{1.73_m2} (ref >59)
GFR, EST NON AFRICAN AMERICAN: 53 mL/min/{1.73_m2} — AB (ref >59)
Glucose: 127 mg/dL — ABNORMAL HIGH (ref 65–99)
Phosphorus: 2.7 mg/dL (ref 2.5–4.5)
Potassium: 3.9 mmol/L (ref 3.5–5.2)
SODIUM: 147 mmol/L — AB (ref 134–144)

## 2016-07-05 LAB — URIC ACID: Uric Acid: 4.8 mg/dL (ref 2.5–7.1)

## 2016-07-05 LAB — ANA W/REFLEX: Anti Nuclear Antibody(ANA): NEGATIVE

## 2016-07-05 LAB — RHEUMATOID FACTOR: Rhuematoid fact SerPl-aCnc: <10 IU/mL (ref 0.0–13.9)

## 2016-07-05 LAB — SEDIMENTATION RATE: Sed Rate: 5 mm/hr (ref 0–40)

## 2016-07-12 ENCOUNTER — Telehealth: Payer: Self-pay | Admitting: *Deleted

## 2016-07-12 DIAGNOSIS — R601 Generalized edema: Secondary | ICD-10-CM

## 2016-07-12 MED ORDER — FUROSEMIDE 20 MG PO TABS: 20.0000 mg | ORAL_TABLET | Freq: Every day | ORAL | 1 refills | Status: DC | PRN

## 2016-07-12 MED ORDER — MELOXICAM 7.5 MG PO TABS: 7.5000 mg | ORAL_TABLET | Freq: Every day | ORAL | 1 refills | Status: DC

## 2016-07-12 NOTE — Telephone Encounter (Signed)
Patient was notified of results. Patient expressed understanding. Rx sent to pharmacy.  

## 2016-07-12 NOTE — Telephone Encounter (Signed)
She can stop triamterene/hctz and start taking furosemide only as needed for swelling. Have sent rx to her pharmacy. She should only need to take it 3 or 4 times a week.  Schedule follow up in 6-7 weeks to check on blood pressure.

## 2016-07-12 NOTE — Telephone Encounter (Signed)
Patient was notified. Patient expressed understanding. Follow-up appt scheduled.

## 2016-07-12 NOTE — Telephone Encounter (Signed)
Patient wanted to know if you are going to change her HCTZ due to edema, as discussed at her ov or if she is to continue with medication for now? Please advise?

## 2016-07-12 NOTE — Telephone Encounter (Signed)
-----   Message from [name], MD sent at 07/12/2016  1:52 PM EDT ----- Labs are normal, no sign of inflammatory arthritis. Recommend she try meloxicam 7.5mg  once a day for pains in hands. #30, rf x1. If this does not help within 2 weeks then call back for referral to rheumatology.

## 2016-08-11 ENCOUNTER — Ambulatory Visit: Payer: Self-pay | Admitting: Family Medicine

## 2016-09-19 ENCOUNTER — Encounter: Payer: Self-pay | Admitting: Family Medicine

## 2016-09-19 ENCOUNTER — Ambulatory Visit (INDEPENDENT_AMBULATORY_CARE_PROVIDER_SITE_OTHER): Payer: Commercial Managed Care - HMO | Admitting: Family Medicine

## 2016-09-19 VITALS — BP 132/70 | HR 76 | Temp 97.9°F | Resp 16 | Wt 173.0 lb

## 2016-09-19 DIAGNOSIS — Z23 Encounter for immunization: Secondary | ICD-10-CM | POA: Diagnosis not present

## 2016-09-19 DIAGNOSIS — R601 Generalized edema: Secondary | ICD-10-CM

## 2016-09-19 DIAGNOSIS — I10 Essential (primary) hypertension: Secondary | ICD-10-CM

## 2016-09-19 NOTE — Progress Notes (Signed)
Patient: Teresa Shields Female    DOB: 09-Jun-1936   80 y.o.   MRN: [id number] Visit Date: 09/19/2016  Today's Provider: Mila Merry, MD   Chief Complaint  Patient presents with  . Hypertension   Subjective:    HPI  Hypertension, follow-up:  BP Readings from Last 3 Encounters:  07/04/16 120/64  01/25/16 (!) 112/52  12/29/15 (!) 146/60    She was last seen for hypertension 2 months ago.  BP at that visit was 120/64. Management since that visit includes stopping Triamterene-HCTZ due to elevated uric acid and started taking Furosemide only as needed for edema. She reports poor compliance with treatment. Patient states after she stopped taking the Triamterene-HCTZ she started having swelling in both ankles and her blood pressure stayed elevated. Since then patient has started back taking Triamterene- HCTZ and has stopped taking Lasix. She is having side effects elevated blood pressure and swelling. She is exercising. She is adherent to low salt diet.   Outside blood pressures are 133/81 this morning at home. She is experiencing lower extremity edema.  Patient denies chest pain, chest pressure/discomfort, claudication, dyspnea, exertional chest pressure/discomfort, fatigue, irregular heart beat, near-syncope, orthopnea, palpitations, paroxysmal nocturnal dyspnea, syncope and tachypnea.   Cardiovascular risk factors include advanced age (older than 43 for men, 34 for women) and hypertension.  Use of agents associated with hypertension: NSAIDS.     Weight trend: stable Wt Readings from Last 3 Encounters:  07/04/16 174 lb (78.9 kg)  01/25/16 175 lb (79.4 kg)  12/29/15 177 lb (80.3 kg)    Current diet: well balanced  ------------------------------------------------------------------------     Allergies  Allergen Reactions  . Naproxen Diarrhea     Current Outpatient Prescriptions:  .  allopurinol (ZYLOPRIM) 300 MG tablet, TAKE 1 TABLET EVERY DAY, Disp: 90  tablet, Rfl: 3 .  Ascorbic Acid (VITAMIN C) 100 MG tablet, Take 1 tablet by mouth daily., Disp: , Rfl:  .  aspirin 81 MG tablet, Take 1 tablet by mouth daily., Disp: , Rfl:  .  atorvastatin (LIPITOR) 40 MG tablet, TAKE 1 TABLET AT BEDTIME FOR CHOLESTEROL, Disp: 90 tablet, Rfl: 4 .  calcium-vitamin D (OSCAL 500/200 D-3) 500-200 MG-UNIT per tablet, Take 2 tablets by mouth daily., Disp: , Rfl:  .  colchicine 0.6 MG tablet, 2 tablets at first sign of gout, then one daily as needed, Disp: 90 tablet, Rfl: 1 .  gabapentin (NEURONTIN) 100 MG capsule, Take 1 capsule (100 mg total) by mouth at bedtime., Disp: 90 capsule, Rfl: 3 .  indomethacin (INDOCIN) 25 MG capsule, Take 1-2 capsules (25-50 mg total) by mouth 2 (two) times daily with a meal. For gout, Disp: 30 capsule, Rfl: 1 .  meloxicam (MOBIC) 7.5 MG tablet, Take 1 tablet (7.5 mg total) by mouth daily., Disp: 30 tablet, Rfl: 1 .  metoprolol succinate (TOPROL-XL) 50 MG 24 hr tablet, TAKE 1 TABLET EVERY DAY FOR BLOOD PRESSURE, Disp: 90 tablet, Rfl: 4 .  MULTIPLE VITAMIN PO, Take 1 tablet by mouth daily., Disp: , Rfl:  .  Vitamin E 400 UNITS TABS, Take 1 tablet by mouth daily., Disp: , Rfl:  .  furosemide (LASIX) 20 MG tablet, Take 1 tablet (20 mg total) by mouth daily as needed for edema. (Patient not taking: Reported on 09/19/2016), Disp: 30 tablet, Rfl: 1 .  triamterene-hydrochlorothiazide (MAXZIDE) 75-50 MG tablet, Take 0.5 tablets by mouth daily., Disp: , Rfl:   Review of Systems  Constitutional: Negative for appetite  change, chills, fatigue and fever.  Respiratory: Negative for chest tightness and shortness of breath.   Cardiovascular: Negative for chest pain and palpitations.  Gastrointestinal: Negative for abdominal pain, nausea and vomiting.  Musculoskeletal: Positive for arthralgias (pain in both hands) and joint swelling (swelling in both hands).  Neurological: Negative for dizziness and weakness.    Social History  Substance Use Topics    . Smoking status: Never Smoker  . Smokeless tobacco: Never Used  . Alcohol use No   Objective:   BP 132/70 (BP Location: Right Arm, Patient Position: Sitting, Cuff Size: Large)   Pulse 76   Temp 97.9 F (36.6 C) (Oral)   Resp 16   Wt 173 lb (78.5 kg)   SpO2 95% Comment: room air  BMI 27.92 kg/m   Physical Exam   General Appearance:    Alert, cooperative, no distress  Eyes:    PERRL, conjunctiva/corneas clear, EOM's intact       Lungs:     Clear to auscultation bilaterally, respirations unlabored  Heart:    Regular rate and rhythm  Neurologic:   Awake, alert, oriented x 3. No apparent focal neurological           defect.           Assessment & Plan:     1. Need for influenza vaccination  - Flu vaccine HIGH DOSE PF  2. Generalized edema Worsened after Maxzide was stopped (due to elevated uric acid). She is now back on Maxzide as below and prefers to stay on this medication despite its likely contribution to gout flares.   3. Hypertension, essential, benign Continue Maxzide for the time being.         The entirety of the information documented in the History of Present Illness, Review of Systems and Physical Exam were personally obtained by me. Portions of this information were initially documented by Anson Oregonachelle Presley, CMA and reviewed by me for thoroughness and accuracy.    Mila Merryonald [name], MD  [facility]

## 2016-10-26 ENCOUNTER — Other Ambulatory Visit: Payer: Self-pay | Admitting: Family Medicine

## 2016-11-20 ENCOUNTER — Other Ambulatory Visit: Payer: Self-pay | Admitting: Family Medicine

## 2017-01-05 ENCOUNTER — Ambulatory Visit (INDEPENDENT_AMBULATORY_CARE_PROVIDER_SITE_OTHER): Payer: Medicare HMO | Admitting: Family Medicine

## 2017-01-05 ENCOUNTER — Ambulatory Visit (INDEPENDENT_AMBULATORY_CARE_PROVIDER_SITE_OTHER): Payer: Medicare HMO

## 2017-01-05 VITALS — BP 144/71 | HR 76 | Temp 98.7°F | Ht 66.0 in | Wt 169.6 lb

## 2017-01-05 DIAGNOSIS — E782 Mixed hyperlipidemia: Secondary | ICD-10-CM | POA: Diagnosis not present

## 2017-01-05 DIAGNOSIS — Z23 Encounter for immunization: Secondary | ICD-10-CM | POA: Diagnosis not present

## 2017-01-05 DIAGNOSIS — R739 Hyperglycemia, unspecified: Secondary | ICD-10-CM | POA: Diagnosis not present

## 2017-01-05 DIAGNOSIS — R234 Changes in skin texture: Secondary | ICD-10-CM | POA: Diagnosis not present

## 2017-01-05 DIAGNOSIS — M109 Gout, unspecified: Secondary | ICD-10-CM

## 2017-01-05 DIAGNOSIS — Z0000 Encounter for general adult medical examination without abnormal findings: Secondary | ICD-10-CM

## 2017-01-05 DIAGNOSIS — I10 Essential (primary) hypertension: Secondary | ICD-10-CM | POA: Diagnosis not present

## 2017-01-05 NOTE — Progress Notes (Signed)
Subjective:   Teresa Shields is a 81 y.o. female who presents for Medicare Annual (Subsequent) preventive examination.  Review of Systems:  N/A  Cardiac Risk Factors include: advanced age (>7men, >66 women);dyslipidemia;hypertension     Objective:     Vitals: BP (!) 144/71 (BP Location: Right Arm)   Pulse 76   Temp 98.7 F (37.1 C) (Oral)   Ht 5\' 6"  (1.676 m)   Wt 169 lb 9.6 oz (76.9 kg)   BMI 27.37 kg/m   Body mass index is 27.37 kg/m.   Tobacco History  Smoking Status  . Never Smoker  Smokeless Tobacco  . Never Used     Counseling given: Not Answered   Past Medical History:  Diagnosis Date  . Gout   . History of chicken pox   . Hyperlipidemia   . Hypertension    Past Surgical History:  Procedure Laterality Date  . Bone Density Study  01/08/2011   T-1.2, L-Spine, T-1.9 Osteopenia  . Left hip skin cancer resection  2010   Dermatology   Family History  Problem Relation Age of Onset  . AAA (abdominal aortic aneurysm) Sister   . Healthy Mother   . Healthy Father   . Gout Other    History  Sexual Activity  . Sexual activity: Not on file    Outpatient Encounter Prescriptions as of 01/05/2017  Medication Sig  . allopurinol (ZYLOPRIM) 300 MG tablet TAKE 1 TABLET EVERY DAY  . Ascorbic Acid (VITAMIN C) 100 MG tablet Take 1 tablet by mouth daily.  Marland Kitchen aspirin 81 MG tablet Take 1 tablet by mouth daily.  Marland Kitchen atorvastatin (LIPITOR) 40 MG tablet TAKE 1 TABLET AT BEDTIME FOR CHOLESTEROL  . calcium-vitamin D (OSCAL 500/200 D-3) 500-200 MG-UNIT per tablet Take 2 tablets by mouth daily.  . colchicine 0.6 MG tablet 2 tablets at first sign of gout, then one daily as needed  . gabapentin (NEURONTIN) 100 MG capsule Take 1 capsule (100 mg total) by mouth at bedtime.  . indomethacin (INDOCIN) 25 MG capsule Take 1-2 capsules (25-50 mg total) by mouth 2 (two) times daily with a meal. For gout (Patient taking differently: Take 25-50 mg by mouth 2 (two) times daily with a  meal. For gout)  . metoprolol succinate (TOPROL-XL) 50 MG 24 hr tablet TAKE 1 TABLET EVERY DAY FOR BLOOD PRESSURE  . MULTIPLE VITAMIN PO Take 1 tablet by mouth daily.  Marland Kitchen triamterene-hydrochlorothiazide (MAXZIDE) 75-50 MG tablet TAKE 1 TABLET EVERY DAY  . Vitamin E 400 UNITS TABS Take 1 tablet by mouth daily.  . furosemide (LASIX) 20 MG tablet Take 1 tablet (20 mg total) by mouth daily as needed for edema. (Patient not taking: Reported on 09/19/2016)   No facility-administered encounter medications on file as of 01/05/2017.     Activities of Daily Living In your present state of health, do you have any difficulty performing the following activities: 01/05/2017  Hearing? N  Vision? N  Difficulty concentrating or making decisions? N  Walking or climbing stairs? N  Dressing or bathing? N  Doing errands, shopping? N  Preparing Food and eating ? N  Using the Toilet? N  In the past six months, have you accidently leaked urine? Y  Do you have problems with loss of bowel control? N  Managing your Medications? N  Managing your Finances? N  Housekeeping or managing your Housekeeping? N  Some recent data might be hidden    Patient Care Team: [name], MD as  PCP - General (Family Medicine)    Assessment:     Exercise Activities and Dietary recommendations Current Exercise Habits: The patient does not participate in regular exercise at present, Exercise limited by: Other - see comments (frequent gout flares)  Goals    . Increase water intake          Starting 01/05/17, I will start drinking at least 2 glasses of water a day.       Fall Risk Fall Risk  01/05/2017 09/19/2016 07/04/2016 06/22/2015  Falls in the past year? No No No No   Depression Screen PHQ 2/9 Scores 01/05/2017 09/19/2016 06/22/2015  PHQ - 2 Score 0 0 0  PHQ- 9 Score - 0 -     Cognitive Function     6CIT Screen 01/05/2017  What Year? 0 points  What month? 0 points  What time? 0 points  Count back from 20 0  points  Months in reverse 4 points  Repeat phrase 2 points  Total Score 6    Immunization History  Administered Date(s) Administered  . Influenza, High Dose Seasonal PF 09/30/2015, 09/19/2016  . Pneumococcal Conjugate-13 09/02/2014  . Pneumococcal Polysaccharide-23 01/05/2017  . Tdap 08/28/2012  . Zoster 08/28/2012   Screening Tests Health Maintenance  Topic Date Due  . TETANUS/TDAP  08/28/2022  . INFLUENZA VACCINE  Completed  . DEXA SCAN  Completed  . ZOSTAVAX  Completed  . PNA vac Low Risk Adult  Completed      Plan:  I have personally reviewed and addressed the Medicare Annual Wellness questionnaire and have noted the following in the patient's chart:  A. Medical and social history B. Use of alcohol, tobacco or illicit drugs  C. Current medications and supplements D. Functional ability and status E.  Nutritional status F.  Physical activity G. Advance directives H. List of other physicians I.  Hospitalizations, surgeries, and ER visits in previous 12 months J.  Vitals K. Screenings such as hearing and vision if needed, cognitive and depression L. Referrals and appointments - none  In addition, I have reviewed and discussed with patient certain preventive protocols, quality metrics, and best practice recommendations. A written personalized care plan for preventive services as well as general preventive health recommendations were provided to patient.  See attached scanned questionnaire for additional information.   Signed,  Hyacinth MeekerMckenzie Shenae Bonanno, LPN Nurse Health Advisor   MD Recommendations: None.  I have reviewed the health advisor's note, was available for consultation, and agree with documentation and plan  Mila Merryonald [name], MD

## 2017-01-05 NOTE — Patient Instructions (Signed)
Health Maintenance, Female Introduction Adopting a healthy lifestyle and getting preventive care can go a long way to promote health and wellness. Talk with your health care provider about what schedule of regular examinations is right for you. This is a good chance for you to check in with your provider about disease prevention and staying healthy. In between checkups, there are plenty of things you can do on your own. Experts have done a lot of research about which lifestyle changes and preventive measures are most likely to keep you healthy. Ask your health care provider for more information. Weight and diet Eat a healthy diet  Be sure to include plenty of vegetables, fruits, low-fat dairy products, and lean protein.  Do not eat a lot of foods high in solid fats, added sugars, or salt.  Get regular exercise. This is one of the most important things you can do for your health.  Most adults should exercise for at least 150 minutes each week. The exercise should increase your heart rate and make you sweat (moderate-intensity exercise).  Most adults should also do strengthening exercises at least twice a week. This is in addition to the moderate-intensity exercise. Maintain a healthy weight  Body mass index (BMI) is a measurement that can be used to identify possible weight problems. It estimates body fat based on height and weight. Your health care provider can help determine your BMI and help you achieve or maintain a healthy weight.  For females 4 years of age and older:  A BMI below 18.5 is considered underweight.  A BMI of 18.5 to 24.9 is normal.  A BMI of 25 to 29.9 is considered overweight.  A BMI of 30 and above is considered obese. Watch levels of cholesterol and blood lipids  You should start having your blood tested for lipids and cholesterol at 79 years of age, then have this test every 5 years.  You may need to have your cholesterol levels checked more often  if:  Your lipid or cholesterol levels are high.  You are older than 79 years of age.  You are at high risk for heart disease. Cancer screening Lung Cancer  Lung cancer screening is recommended for adults 12-31 years old who are at high risk for lung cancer because of a history of smoking.  A yearly low-dose CT scan of the lungs is recommended for people who:  Currently smoke.  Have quit within the past 15 years.  Have at least a 30-pack-year history of smoking. A pack year is smoking an average of one pack of cigarettes a day for 1 year.  Yearly screening should continue until it has been 15 years since you quit.  Yearly screening should stop if you develop a health problem that would prevent you from having lung cancer treatment. Breast Cancer  Practice breast self-awareness. This means understanding how your breasts normally appear and feel.  It also means doing regular breast self-exams. Let your health care provider know about any changes, no matter how small.  If you are in your 20s or 30s, you should have a clinical breast exam (CBE) by a health care provider every 1-3 years as part of a regular health exam.  If you are 74 or older, have a CBE every year. Also consider having a breast X-ray (mammogram) every year.  If you have a family history of breast cancer, talk to your health care provider about genetic screening.  If you are at high risk for breast cancer,  talk to your health care provider about having an MRI and a mammogram every year.  Breast cancer gene (BRCA) assessment is recommended for women who have family members with BRCA-related cancers. BRCA-related cancers include:  Breast.  Ovarian.  Tubal.  Peritoneal cancers.  Results of the assessment will determine the need for genetic counseling and BRCA1 and BRCA2 testing. Colorectal Cancer  This type of cancer can be detected and often prevented.  Routine colorectal cancer screening usually begins  at 79 years of age and continues through 79 years of age.  Your health care provider may recommend screening at an earlier age if you have risk factors for colon cancer.  Your health care provider may also recommend using home test kits to check for hidden blood in the stool.  A small camera at the end of a tube can be used to examine your colon directly (sigmoidoscopy or colonoscopy). This is done to check for the earliest forms of colorectal cancer.  Routine screening usually begins at age 50.  Direct examination of the colon should be repeated every 5-10 years through 79 years of age. However, you may need to be screened more often if early forms of precancerous polyps or small growths are found. Skin Cancer  Check your skin from head to toe regularly.  Tell your health care provider about any new moles or changes in moles, especially if there is a change in a mole's shape or color.  Also tell your health care provider if you have a mole that is larger than the size of a pencil eraser.  Always use sunscreen. Apply sunscreen liberally and repeatedly throughout the day.  Protect yourself by wearing long sleeves, pants, a wide-brimmed hat, and sunglasses whenever you are outside. Heart disease, diabetes, and high blood pressure  High blood pressure causes heart disease and increases the risk of stroke. High blood pressure is more likely to develop in:  People who have blood pressure in the high end of the normal range (130-139/85-89 mm Hg).  People who are overweight or obese.  People who are African American.  If you are 18-39 years of age, have your blood pressure checked every 3-5 years. If you are 40 years of age or older, have your blood pressure checked every year. You should have your blood pressure measured twice-once when you are at a hospital or clinic, and once when you are not at a hospital or clinic. Record the average of the two measurements. To check your blood pressure  when you are not at a hospital or clinic, you can use:  An automated blood pressure machine at a pharmacy.  A home blood pressure monitor.  If you are between 55 years and 79 years old, ask your health care provider if you should take aspirin to prevent strokes.  Have regular diabetes screenings. This involves taking a blood sample to check your fasting blood sugar level.  If you are at a normal weight and have a low risk for diabetes, have this test once every three years after 79 years of age.  If you are overweight and have a high risk for diabetes, consider being tested at a younger age or more often. Preventing infection Hepatitis B  If you have a higher risk for hepatitis B, you should be screened for this virus. You are considered at high risk for hepatitis B if:  You were born in a country where hepatitis B is common. Ask your health care provider which countries are   considered high risk.  Your parents were born in a high-risk country, and you have not been immunized against hepatitis B (hepatitis B vaccine).  You have HIV or AIDS.  You use needles to inject street drugs.  You live with someone who has hepatitis B.  You have had sex with someone who has hepatitis B.  You get hemodialysis treatment.  You take certain medicines for conditions, including cancer, organ transplantation, and autoimmune conditions. Hepatitis C  Blood testing is recommended for:  Everyone born from 1945 through 1965.  Anyone with known risk factors for hepatitis C. Osteoporosis and menopause  Osteoporosis is a disease in which the bones lose minerals and strength with aging. This can result in serious bone fractures. Your risk for osteoporosis can be identified using a bone density scan.  If you are 65 years of age or older, or if you are at risk for osteoporosis and fractures, ask your health care provider if you should be screened.  Ask your health care provider whether you should take  a calcium or vitamin D supplement to lower your risk for osteoporosis.  Menopause may have certain physical symptoms and risks.  Hormone replacement therapy may reduce some of these symptoms and risks. Talk to your health care provider about whether hormone replacement therapy is right for you. Follow these instructions at home:  Schedule regular health, dental, and eye exams.  Stay current with your immunizations.  Do not use any tobacco products including cigarettes, chewing tobacco, or electronic cigarettes.  If you are pregnant, do not drink alcohol.  If you are breastfeeding, limit how much and how often you drink alcohol.  Limit alcohol intake to no more than 1 drink per day for nonpregnant women. One drink equals 12 ounces of beer, 5 ounces of wine, or 1 ounces of hard liquor.  Do not use street drugs.  Do not share needles.  Ask your health care provider for help if you need support or information about quitting drugs.  Tell your health care provider if you often feel depressed.  Tell your health care provider if you have ever been abused or do not feel safe at home. This information is not intended to replace advice given to you by your health care provider. Make sure you discuss any questions you have with your health care provider. Document Released: 06/12/2011 Document Revised: 05/04/2016 Document Reviewed: 08/31/2015  2017 Elsevier  

## 2017-01-05 NOTE — Progress Notes (Signed)
Patient: Teresa Shields Female    DOB: 12/11/1936   81 y.o.   MRN: [id number] Visit Date: 01/05/2017  Today's Provider: Mila Merry, MD   Chief Complaint  Patient presents with  . Follow-up  . Hypertension  . Hyperglycemia  . Edema   Subjective:    HPI Patient is here for follow up of chronic conditions. Had AWV with [facility] this morning. Generally feeling well.   He is most concerned about hands feeling slippery for the last 5-6 months. She has mild arthritic pains, but no numbness, burning, weakness or any other neurologic symptoms. States she often has trouble with objects slipping out of her ands. Denies abnormal sweating. She does not use any hand lotions.   Hyperglycemia From 07/04/2016-Borderline diabetes. Is avoiding sweets and starchy foods.  Lab Results  Component Value Date   HGBA1C 6.5 07/04/2016     Gout of foot, unspecified cause, unspecified chronicity, unspecified laterality From 07/04/2016-no changes were made. States that she is having gout flares about twice a month. Is taking 300mg  allopurinol every day and colchicine for a few days every time there is a flare. We recommended changing diuretic in the past but she didn't want to do that since her BP had been controlled and it helps with swelling. She feels like gout flares became more frequent after allopurinol was increased from 100mg  BID to 300mg  once a day. She is having no adverse effects from her medications.   Edema From 09/19/2016-restarted Maxzide.    Hypertension, follow-up:  BP Readings from Last 3 Encounters:  01/05/17 (!) 144/71  09/19/16 132/70  07/04/16 120/64    She was last seen for hypertension 3 months ago.  BP at that visit was 132/70 Management since that visit includes;Continue Maxzide for the time being .She reports good compliance with treatment. She is not having side effects. none She is exercising. She is adherent to low salt diet.   Outside blood pressures are  136/75. She is experiencing none.  Patient denies none.   Cardiovascular risk factors include none.  Use of agents associated with hypertension: none.   ----------------------------------------------------------------   Allergies  Allergen Reactions  . Naproxen Diarrhea     Current Outpatient Prescriptions:  .  allopurinol (ZYLOPRIM) 300 MG tablet, TAKE 1 TABLET EVERY DAY, Disp: 90 tablet, Rfl: 3 .  Ascorbic Acid (VITAMIN C) 100 MG tablet, Take 1 tablet by mouth daily., Disp: , Rfl:  .  aspirin 81 MG tablet, Take 1 tablet by mouth daily., Disp: , Rfl:  .  atorvastatin (LIPITOR) 40 MG tablet, TAKE 1 TABLET AT BEDTIME FOR CHOLESTEROL, Disp: 90 tablet, Rfl: 4 .  calcium-vitamin D (OSCAL 500/200 D-3) 500-200 MG-UNIT per tablet, Take 2 tablets by mouth daily., Disp: , Rfl:  .  colchicine 0.6 MG tablet, 2 tablets at first sign of gout, then one daily as needed, Disp: 90 tablet, Rfl: 1 .  furosemide (LASIX) 20 MG tablet, Take 1 tablet (20 mg total) by mouth daily as needed for edema. (Patient not taking: Reported on 09/19/2016), Disp: 30 tablet, Rfl: 1 .  gabapentin (NEURONTIN) 100 MG capsule, Take 1 capsule (100 mg total) by mouth at bedtime., Disp: 90 capsule, Rfl: 3 .  indomethacin (INDOCIN) 25 MG capsule, Take 1-2 capsules (25-50 mg total) by mouth 2 (two) times daily with a meal. For gout (Patient taking differently: Take 25-50 mg by mouth 2 (two) times daily with a meal. For gout), Disp: 30 capsule, Rfl: 1 .  metoprolol succinate (TOPROL-XL) 50 MG 24 hr tablet, TAKE 1 TABLET EVERY DAY FOR BLOOD PRESSURE, Disp: 90 tablet, Rfl: 4 .  MULTIPLE VITAMIN PO, Take 1 tablet by mouth daily., Disp: , Rfl:  .  triamterene-hydrochlorothiazide (MAXZIDE) 75-50 MG tablet, TAKE 1 TABLET EVERY DAY, Disp: 90 tablet, Rfl: 3 .  Vitamin E 400 UNITS TABS, Take 1 tablet by mouth daily., Disp: , Rfl:   Review of Systems  Constitutional: Negative for appetite change, chills, fatigue and fever.  Respiratory:  Negative for chest tightness and shortness of breath.   Cardiovascular: Negative for chest pain and palpitations.  Gastrointestinal: Negative for abdominal pain, nausea and vomiting.  Neurological: Negative for dizziness and weakness.    Social History  Substance Use Topics  . Smoking status: Never Smoker  . Smokeless tobacco: Never Used  . Alcohol use No   Objective:    Vitals: BP (!) 144/71 (BP Location: Right Arm)   Pulse 76   Temp 98.7 F (37.1 C) (Oral)   Ht 5\' 6"  (1.676 m)   Wt 169 lb 9.6 oz (76.9 kg)   BMI 27.37 kg/m   Body mass index is 27.37 kg/m  Physical Exam   General Appearance:    Alert, cooperative, no distress  Eyes:    PERRL, conjunctiva/corneas clear, EOM's intact       Lungs:     Clear to auscultation bilaterally, respirations unlabored  Heart:    Regular rate and rhythm  Neurologic:   Awake, alert, oriented x 3. No apparent focal neurological           defect.   Skin:   No lesions or hands. Normal sensation to pin prick, soft touch and vibration. Slightly oily texture to palms of hands. No diaphoresis notes.        Assessment & Plan:     1. Hyperglycemia Current diet controlled.  - Hemoglobin A1c  2. Hyperlipidemia, mixed She is tolerating atorvastatin well with no adverse effects.   - TSH - Hepatic function panel - Lipid panel - TSH  3. Hypertension, essential, benign Stable. Continue current medications for now.  - EKG 12-Lead - Renal function panel  4. Gout of foot, unspecified cause, unspecified chronicity, unspecified laterality Increasing frequency  - Uric acid  5. Skin texture changes She is very aggravated about hands feeling slippery and objects slipping out of fingers. I don't see any signs of neuropathy of hands. She would like to see dermatologist to see if there is anything that can be done about greasy texture of fingers.  - Ambulatory referral to Dermatology       Mila Merryonald Charisa Twitty, MD  [facility]

## 2017-01-12 DIAGNOSIS — I10 Essential (primary) hypertension: Secondary | ICD-10-CM | POA: Diagnosis not present

## 2017-01-12 DIAGNOSIS — R739 Hyperglycemia, unspecified: Secondary | ICD-10-CM | POA: Diagnosis not present

## 2017-01-12 DIAGNOSIS — E782 Mixed hyperlipidemia: Secondary | ICD-10-CM | POA: Diagnosis not present

## 2017-01-12 DIAGNOSIS — M109 Gout, unspecified: Secondary | ICD-10-CM | POA: Diagnosis not present

## 2017-01-13 LAB — RENAL FUNCTION PANEL
Albumin: 4 g/dL (ref 3.5–4.8)
BUN/Creatinine Ratio: 19 (ref 12–28)
BUN: 19 mg/dL (ref 8–27)
CALCIUM: 9.5 mg/dL (ref 8.7–10.3)
CO2: 24 mmol/L (ref 18–29)
CREATININE: 1 mg/dL (ref 0.57–1.00)
Chloride: 99 mmol/L (ref 96–106)
GFR calc Af Amer: 62 mL/min/{1.73_m2} (ref >59)
GFR calc non Af Amer: 54 mL/min/{1.73_m2} — ABNORMAL LOW (ref >59)
Glucose: 143 mg/dL — ABNORMAL HIGH (ref 65–99)
PHOSPHORUS: 3.1 mg/dL (ref 2.5–4.5)
POTASSIUM: 3.5 mmol/L (ref 3.5–5.2)
SODIUM: 145 mmol/L — AB (ref 134–144)

## 2017-01-13 LAB — LIPID PANEL
CHOL/HDL RATIO: 4.8 ratio — AB (ref 0.0–4.4)
Cholesterol, Total: 163 mg/dL (ref 100–199)
HDL: 34 mg/dL — ABNORMAL LOW (ref >39)
LDL Calculated: 50 mg/dL (ref 0–99)
TRIGLYCERIDES: 393 mg/dL — AB (ref 0–149)
VLDL Cholesterol Cal: 79 mg/dL — ABNORMAL HIGH (ref 5–40)

## 2017-01-13 LAB — HEPATIC FUNCTION PANEL
ALT: 29 IU/L (ref 0–32)
AST: 18 IU/L (ref 0–40)
Alkaline Phosphatase: 105 IU/L (ref 39–117)
BILIRUBIN TOTAL: 0.6 mg/dL (ref 0.0–1.2)
Bilirubin, Direct: 0.19 mg/dL (ref 0.00–0.40)
Total Protein: 5.9 g/dL — ABNORMAL LOW (ref 6.0–8.5)

## 2017-01-13 LAB — HEMOGLOBIN A1C
Est. average glucose Bld gHb Est-mCnc: 140 mg/dL
Hgb A1c MFr Bld: 6.5 % — ABNORMAL HIGH (ref 4.8–5.6)

## 2017-01-13 LAB — URIC ACID: Uric Acid: 3.7 mg/dL (ref 2.5–7.1)

## 2017-01-13 LAB — TSH: TSH: 1.69 u[IU]/mL (ref 0.450–4.500)

## 2017-01-15 ENCOUNTER — Telehealth: Payer: Self-pay

## 2017-01-15 NOTE — Telephone Encounter (Signed)
She can increase allopurinol to 300mg  twice a day for the next month. If this does not help then we will need to change her blood pressure medication.

## 2017-01-15 NOTE — Telephone Encounter (Signed)
-----   Message from Malva Limesonald E [name], MD sent at 01/13/2017  4:05 PM EST ----- kidney functions, electrolytes are all normal.A1c Is borderline for dabetes at 6.5. Continue current medications.  Follow up 4-5 months for diabetes

## 2017-01-15 NOTE — Telephone Encounter (Signed)
Advised pt. Pt states she is getting gout flares about every 3 weeks. Pt states you discussed increasing the allopurinol after receiving lab results. Please advise. Allene DillonEmily Drozdowski, CMA

## 2017-02-20 DIAGNOSIS — Z711 Person with feared health complaint in whom no diagnosis is made: Secondary | ICD-10-CM | POA: Diagnosis not present

## 2017-03-20 ENCOUNTER — Other Ambulatory Visit: Payer: Self-pay | Admitting: Family Medicine

## 2017-05-01 ENCOUNTER — Encounter: Payer: Self-pay | Admitting: Family Medicine

## 2017-05-01 ENCOUNTER — Ambulatory Visit (INDEPENDENT_AMBULATORY_CARE_PROVIDER_SITE_OTHER): Payer: Medicare HMO | Admitting: Family Medicine

## 2017-05-01 VITALS — BP 138/66 | HR 84 | Temp 98.0°F | Resp 16 | Wt 169.0 lb

## 2017-05-01 DIAGNOSIS — M109 Gout, unspecified: Secondary | ICD-10-CM | POA: Diagnosis not present

## 2017-05-01 DIAGNOSIS — E118 Type 2 diabetes mellitus with unspecified complications: Secondary | ICD-10-CM

## 2017-05-01 DIAGNOSIS — E782 Mixed hyperlipidemia: Secondary | ICD-10-CM

## 2017-05-01 DIAGNOSIS — I10 Essential (primary) hypertension: Secondary | ICD-10-CM | POA: Diagnosis not present

## 2017-05-01 LAB — POCT GLYCOSYLATED HEMOGLOBIN (HGB A1C): Hemoglobin A1C: 6.5

## 2017-05-01 NOTE — Progress Notes (Signed)
Patient: Teresa Shields Female    DOB: 07/13/1936   81 y.o.   MRN: [id number] Visit Date: 05/01/2017  Today's Provider: Mila Merry, MD   Chief Complaint  Patient presents with  . Hypertension  . Hyperlipidemia  . Diabetes  . Gout   Subjective:    HPI   Diabetes Mellitus Type II, Follow-up:   Lab Results  Component Value Date   HGBA1C 6.5 (H) 01/12/2017   HGBA1C 6.5 07/04/2016   HGBA1C 6.3 12/29/2015   Last seen for diabetes 3 months ago.  Management since then includes No changes. She reports excellent compliance with treatment. She is not having side effects.  Current symptoms include none and have been stable. Home blood sugar records: Pt does not check her blood sugar at home.   Episodes of hypoglycemia? no   Current Insulin Regimen: None Most Recent Eye Exam: About two years ago. Weight trend: stable Prior visit with dietician: no Current diet: in general, a "healthy" diet   Current exercise: walking  ------------------------------------------------------------------------   Hypertension, follow-up:  BP Readings from Last 3 Encounters:  05/01/17 138/66  01/05/17 (!) 144/71  09/19/16 132/70    She was last seen for hypertension 3 months ago.  BP at that visit was 144/71. Management since that visit includes None.She reports excellent compliance with treatment. She is not having side effects.  She is exercising. She is adherent to low salt diet.   Outside blood pressures are General around 130's/70-80's. She is experiencing lower extremity edema.  Patient denies chest pain, exertional chest pressure/discomfort and fatigue.   Cardiovascular risk factors include advanced age (older than 38 for men, 53 for women), diabetes mellitus and hypertension.  Use of agents associated with hypertension: none.   ------------------------------------------------------------------------    Lipid/Cholesterol, Follow-up:   Last seen for this 3 months  ago.  Management since that visit includes None.  Last Lipid Panel:    Component Value Date/Time   CHOL 163 01/12/2017 0915   TRIG 393 (H) 01/12/2017 0915   HDL 34 (L) 01/12/2017 0915   CHOLHDL 4.8 (H) 01/12/2017 0915   LDLCALC 50 01/12/2017 0915    She reports excellent compliance with treatment. She is not having side effects.   Wt Readings from Last 3 Encounters:  05/01/17 169 lb (76.7 kg)  01/05/17 169 lb 9.6 oz (76.9 kg)  09/19/16 173 lb (78.5 kg)    ------------------------------------------------------------------------ Gout: Patient here for evaluation of acute gouty arthritis. The patient reports last attack was about thee weeks ago in her feet.  Attacks occur primarily in the left and right foot.       Allergies  Allergen Reactions  . Naproxen Diarrhea     Current Outpatient Prescriptions:  .  allopurinol (ZYLOPRIM) 300 MG tablet, TAKE 1 TABLET EVERY DAY, Disp: 90 tablet, Rfl: 3 .  Ascorbic Acid (VITAMIN C) 100 MG tablet, Take 1 tablet by mouth daily., Disp: , Rfl:  .  aspirin 81 MG tablet, Take 1 tablet by mouth daily., Disp: , Rfl:  .  atorvastatin (LIPITOR) 40 MG tablet, TAKE 1 TABLET AT BEDTIME FOR CHOLESTEROL, Disp: 90 tablet, Rfl: 4 .  calcium-vitamin D (OSCAL 500/200 D-3) 500-200 MG-UNIT per tablet, Take 2 tablets by mouth daily., Disp: , Rfl:  .  colchicine 0.6 MG tablet, 2 tablets at first sign of gout, then one daily as needed, Disp: 90 tablet, Rfl: 1 .  furosemide (LASIX) 20 MG tablet, Take 1 tablet (20 mg total)  by mouth daily as needed for edema., Disp: 30 tablet, Rfl: 1 .  gabapentin (NEURONTIN) 100 MG capsule, Take 1 capsule (100 mg total) by mouth at bedtime., Disp: 90 capsule, Rfl: 3 .  indomethacin (INDOCIN) 25 MG capsule, Take 1-2 capsules (25-50 mg total) by mouth 2 (two) times daily with a meal. For gout (Patient taking differently: Take 25-50 mg by mouth 2 (two) times daily with a meal. For gout), Disp: 30 capsule, Rfl: 1 .  metoprolol  succinate (TOPROL-XL) 50 MG 24 hr tablet, TAKE 1 TABLET EVERY DAY FOR BLOOD PRESSURE, Disp: 90 tablet, Rfl: 4 .  MULTIPLE VITAMIN PO, Take 1 tablet by mouth daily., Disp: , Rfl:  .  triamterene-hydrochlorothiazide (MAXZIDE) 75-50 MG tablet, TAKE 1 TABLET EVERY DAY, Disp: 90 tablet, Rfl: 3 .  Vitamin E 400 UNITS TABS, Take 1 tablet by mouth daily., Disp: , Rfl:   Review of Systems  Constitutional: Negative.   Respiratory: Negative.   Cardiovascular: Positive for leg swelling. Negative for chest pain and palpitations.  Gastrointestinal: Negative.   Endocrine: Negative.   Musculoskeletal: Positive for arthralgias (In her feet when she is having a gout flare.).  Neurological: Negative for dizziness, light-headedness and headaches.    Social History  Substance Use Topics  . Smoking status: Never Smoker  . Smokeless tobacco: Never Used  . Alcohol use No   Objective:   BP 138/66 (BP Location: Right Arm, Patient Position: Sitting, Cuff Size: Large)   Pulse 84   Temp 98 F (36.7 C) (Oral)   Resp 16   Wt 169 lb (76.7 kg)   BMI 27.28 kg/m  Vitals:   05/01/17 0943  BP: 138/66  Pulse: 84  Resp: 16  Temp: 98 F (36.7 C)  TempSrc: Oral  Weight: 169 lb (76.7 kg)     Physical Exam  General Appearance:    Alert, cooperative, no distress, obese  Eyes:    PERRL, conjunctiva/corneas clear, EOM's intact       Lungs:     Clear to auscultation bilaterally, respirations unlabored  Heart:    Regular rate and rhythm  Neurologic:   Awake, alert, oriented x 3. No apparent focal neurological           defect.        Results for orders placed or performed in visit on 05/01/17  POCT glycosylated hemoglobin (Hb A1C)  Result Value Ref Range   Hemoglobin A1C 6.5        Assessment & Plan:     1. Hypertension, essential, benign Well controlled.  Continue current medications.    2. Gout of foot, unspecified cause, unspecified chronicity, unspecified laterality Fairly well controlled on  current dose of allopurinol  3. . Controlled diabetes mellitus type 2 with complications, unspecified whether long term insulin use (HCC) Stable, diet controlled.  - POCT glycosylated hemoglobin (Hb A1C)  Return in about 4 months (around 09/01/2017).       Mila Merryonald Otho Michalik, MD  [facility]

## 2017-06-06 ENCOUNTER — Other Ambulatory Visit: Payer: Self-pay | Admitting: Family Medicine

## 2017-08-08 ENCOUNTER — Other Ambulatory Visit: Payer: Self-pay | Admitting: Family Medicine

## 2017-08-09 NOTE — Telephone Encounter (Signed)
Pharmacy requesting refills. Thanks!  

## 2017-09-11 ENCOUNTER — Ambulatory Visit (INDEPENDENT_AMBULATORY_CARE_PROVIDER_SITE_OTHER): Payer: Medicare HMO | Admitting: Family Medicine

## 2017-09-11 ENCOUNTER — Encounter: Payer: Self-pay | Admitting: Family Medicine

## 2017-09-11 VITALS — BP 110/64 | HR 73 | Temp 97.8°F | Resp 16 | Ht 66.0 in | Wt 169.0 lb

## 2017-09-11 DIAGNOSIS — Z23 Encounter for immunization: Secondary | ICD-10-CM

## 2017-09-11 DIAGNOSIS — E1140 Type 2 diabetes mellitus with diabetic neuropathy, unspecified: Secondary | ICD-10-CM | POA: Diagnosis not present

## 2017-09-11 DIAGNOSIS — Z6827 Body mass index (BMI) 27.0-27.9, adult: Secondary | ICD-10-CM | POA: Diagnosis not present

## 2017-09-11 LAB — POCT GLYCOSYLATED HEMOGLOBIN (HGB A1C)
ESTIMATED AVERAGE GLUCOSE: 146
Hemoglobin A1C: 6.7

## 2017-09-11 MED ORDER — GABAPENTIN 100 MG PO CAPS: 100.0000 mg | ORAL_CAPSULE | Freq: Two times a day (BID) | ORAL | 1 refills | Status: DC

## 2017-09-11 NOTE — Progress Notes (Signed)
Patient: Teresa Shields Female    DOB: 1936/08/23   81 y.o.   MRN: [id number] Visit Date: 09/11/2017  Today's Provider: Mila Merry, MD   Chief Complaint  Patient presents with  . Follow-up  . Diabetes  . Hypertension   Subjective:    HPI   Diabetes Mellitus Type II, Follow-up:   Lab Results  Component Value Date   HGBA1C 6.5 05/01/2017   HGBA1C 6.5 (H) 01/12/2017   HGBA1C 6.5 07/04/2016   Last seen for diabetes 5 months ago.  Management since then includes; no changes. She reports good compliance with treatment. She is not having side effects. none Current symptoms include none and have been unchanged. Home blood sugar records: n/a  Episodes of hypoglycemia? no   Current Insulin Regimen: n/a Most Recent Eye Exam: years ago Weight trend: stable Prior visit with dietician: no Current diet: well balanced Current exercise: none  States hands still hurt very bad. Was started on gabapentin 100 several months ago, but does not tolerate taking 2 a night due to vivid dreams.  ----------------------------------------------------------------     Hypertension, follow-up:  BP Readings from Last 3 Encounters:  09/11/17 110/64  05/01/17 138/66  01/05/17 (!) 144/71    She was last seen for hypertension 5 months ago.  BP at that visit was 138/66. Management since that visit includes; no changes.She reports good compliance with treatment. She is not having side effects. none She is not exercising. She is adherent to low salt diet.   Outside blood pressures are 123/72. She is experiencing none.  Patient denies none.   Cardiovascular risk factors include none.  Use of agents associated with hypertension: none.   ----------------------------------------------------------------    Gout of foot, unspecified cause, unspecified chronicity, unspecified laterality From 05/01/2017-no changes.    Allergies  Allergen Reactions  . Naproxen Diarrhea      Current Outpatient Prescriptions:  .  allopurinol (ZYLOPRIM) 300 MG tablet, TAKE 1 TABLET EVERY DAY, Disp: 90 tablet, Rfl: 3 .  Ascorbic Acid (VITAMIN C) 100 MG tablet, Take 1 tablet by mouth daily., Disp: , Rfl:  .  aspirin 81 MG tablet, Take 1 tablet by mouth daily., Disp: , Rfl:  .  atorvastatin (LIPITOR) 40 MG tablet, TAKE 1 TABLET AT BEDTIME FOR CHOLESTEROL, Disp: 90 tablet, Rfl: 4 .  calcium-vitamin D (OSCAL 500/200 D-3) 500-200 MG-UNIT per tablet, Take 2 tablets by mouth daily., Disp: , Rfl:  .  colchicine 0.6 MG tablet, 2 tablets at first sign of gout, then one daily as needed, Disp: 90 tablet, Rfl: 1 .  gabapentin (NEURONTIN) 100 MG capsule, Take 1 capsule (100 mg total) by mouth at bedtime., Disp: 90 capsule, Rfl: 3 .  indomethacin (INDOCIN) 25 MG capsule, Take 1-2 capsules (25-50 mg total) by mouth 2 (two) times daily with a meal. For gout (Patient taking differently: Take 25-50 mg by mouth 2 (two) times daily with a meal. For gout), Disp: 30 capsule, Rfl: 1 .  metoprolol succinate (TOPROL-XL) 50 MG 24 hr tablet, TAKE 1 TABLET EVERY DAY FOR BLOOD PRESSURE, Disp: 90 tablet, Rfl: 4 .  MULTIPLE VITAMIN PO, Take 1 tablet by mouth daily., Disp: , Rfl:  .  triamterene-hydrochlorothiazide (MAXZIDE) 75-50 MG tablet, TAKE 1 TABLET EVERY DAY, Disp: 90 tablet, Rfl: 4 .  Vitamin E 400 UNITS TABS, Take 1 tablet by mouth daily., Disp: , Rfl:   Review of Systems  Constitutional: Negative for appetite change, chills, fatigue and fever.  Respiratory: Negative for chest tightness and shortness of breath.   Cardiovascular: Negative for chest pain and palpitations.  Gastrointestinal: Negative for abdominal pain, nausea and vomiting.  Neurological: Negative for dizziness and weakness.    Social History  Substance Use Topics  . Smoking status: Never Smoker  . Smokeless tobacco: Never Used  . Alcohol use No   Objective:   BP 110/64 (BP Location: Right Arm, Patient Position: Sitting, Cuff  Size: Large)   Pulse 73   Temp 97.8 F (36.6 C) (Oral)   Resp 16   Ht  (1.676 m)   Wt 169 lb (76.7 kg)   SpO2 93%   BMI 27.28 kg/m  Vitals:   09/11/17 1036  BP: 110/64  Pulse: 73  Resp: 16  Temp: 97.8 F (36.6 C)  TempSrc: Oral  SpO2: 93%  Weight: 169 lb (76.7 kg)  Height:  (1.676 m)     Physical Exam   General Appearance:    Alert, cooperative, no distress  Eyes:    PERRL, conjunctiva/corneas clear, EOM's intact       Lungs:     Clear to auscultation bilaterally, respirations unlabored  Heart:    Regular rate and rhythm  Neurologic:   Awake, alert, oriented x 3. No apparent focal neurological           defect.       Results for orders placed or performed in visit on 09/11/17  POCT glycosylated hemoglobin (Hb A1C)  Result Value Ref Range   Hemoglobin A1C 6.7    Est. average glucose Bld gHb Est-mCnc 146        Assessment & Plan:     1. Type 2 diabetes mellitus with diabetic neuropathy, without long-term current use of insulin (HCC) A1c at goal. No medications for now. Change gabapentin to BID for neuropathy.  - POCT glycosylated hemoglobin (Hb A1C) - gabapentin (NEURONTIN) 100 MG capsule; Take 1 capsule (100 mg total) by mouth 2 (two) times daily.  Dispense: 180 capsule; Refill: 1  2. . Need for influenza vaccination  - Flu vaccine HIGH DOSE PF  Follow up 4 months.       Mila Merry, MD  [facility]

## 2018-01-15 ENCOUNTER — Ambulatory Visit (INDEPENDENT_AMBULATORY_CARE_PROVIDER_SITE_OTHER): Payer: Medicare HMO

## 2018-01-15 ENCOUNTER — Ambulatory Visit (INDEPENDENT_AMBULATORY_CARE_PROVIDER_SITE_OTHER): Payer: Medicare HMO | Admitting: Family Medicine

## 2018-01-15 VITALS — BP 136/80 | HR 60 | Temp 98.1°F | Ht 66.0 in | Wt 170.4 lb

## 2018-01-15 DIAGNOSIS — E2839 Other primary ovarian failure: Secondary | ICD-10-CM

## 2018-01-15 DIAGNOSIS — G629 Polyneuropathy, unspecified: Secondary | ICD-10-CM | POA: Diagnosis not present

## 2018-01-15 DIAGNOSIS — I10 Essential (primary) hypertension: Secondary | ICD-10-CM | POA: Diagnosis not present

## 2018-01-15 DIAGNOSIS — E782 Mixed hyperlipidemia: Secondary | ICD-10-CM

## 2018-01-15 DIAGNOSIS — E1140 Type 2 diabetes mellitus with diabetic neuropathy, unspecified: Secondary | ICD-10-CM

## 2018-01-15 DIAGNOSIS — M8580 Other specified disorders of bone density and structure, unspecified site: Secondary | ICD-10-CM

## 2018-01-15 DIAGNOSIS — Z0000 Encounter for general adult medical examination without abnormal findings: Secondary | ICD-10-CM

## 2018-01-15 DIAGNOSIS — Z6827 Body mass index (BMI) 27.0-27.9, adult: Secondary | ICD-10-CM | POA: Diagnosis not present

## 2018-01-15 NOTE — Patient Instructions (Addendum)
The CDC recommends two doses of Shingrix (the shingles vaccine) separated by 2 to 6 months for adults age 80 years and older. I recommend checking with your insurance plan regarding coverage for this vaccine.   . Please call the [facility] ([phone]) to schedule a routine screening mammogram.   

## 2018-01-15 NOTE — Progress Notes (Signed)
Subjective:   Aneesah Sherlean FootM Sedlak is a 82 y.o. female who presents for Medicare Annual (Subsequent) preventive examination.  Review of Systems:  N/A Cardiac Risk Factors include: advanced age (>1755men, 23>65 women);dyslipidemia;hypertension     Objective:     Vitals: BP 136/80 (BP Location: Right Arm)   Pulse 60   Temp 98.1 F (36.7 C) (Oral)   Ht 5\' 6"  (1.676 m)   Wt 170 lb 6.4 oz (77.3 kg)   BMI 27.50 kg/m   Body mass index is 27.5 kg/m.  Advanced Directives 01/15/2018 01/05/2017 06/22/2015  Does Patient Have a Medical Advance Directive? No (No Data) Yes  Type of Advance Directive - - Healthcare Power of Hon[place]orney;Living will  Would patient like information on creating a medical advance directive? No - Patient declined - -    Tobacco Social History   Tobacco Use  Smoking Status Never Smoker  Smokeless Tobacco Never Used     Counseling given: Not Answered   Clinical Intake:  Pre-visit preparation completed: Yes  Pain : 0-10 Pain Score: 4  Pain Type: Chronic pain Pain Location: Hand Pain Orientation: Right, Left Pain Descriptors / Indicators: Sharp Pain Frequency: Intermittent     Nutritional Status: BMI 25 -29 Overweight Nutritional Risks: None  How often do you need to have someone help you when you read instructions, pamphlets, or other written materials from your doctor or pharmacy?: 1 - Never  Interpreter Needed?: No  Information entered by :: [facility] LLCMmarkoski, LPN  Past Medical History:  Diagnosis Date  . Gout   . History of chicken pox   . Hyperlipidemia   . Hypertension    Past Surgical History:  Procedure Laterality Date  . Bone Density Study  01/08/2011   T-1.2, L-Spine, T-1.9 Osteopenia  . Left hip skin cancer resection  2010   Dermatology   Family History  Problem Relation Age of Onset  . AAA (abdominal aortic aneurysm) Sister   . Healthy Mother   . Healthy Father   . Gout Other    Social History   Socioeconomic History  . Marital  status: Widowed    Spouse name: None  . Number of children: 2  . Years of education: None  . Highest education level: 12th grade  Social Needs  . Financial resource strain: Not hard at all  . Food insecurity - worry: Never true  . Food insecurity - inability: Never true  . Transportation needs - medical: No  . Transportation needs - non-medical: No  Occupational History  . Occupation: retired  Tobacco Use  . Smoking status: Never Smoker  . Smokeless tobacco: Never Used  Substance and Sexual Activity  . Alcohol use: No  . Drug use: No  . Sexual activity: None  Other Topics Concern  . None  Social History Narrative  . None    Outpatient Encounter Medications as of 01/15/2018  Medication Sig  . allopurinol (ZYLOPRIM) 300 MG tablet TAKE 1 TABLET EVERY DAY  . Ascorbic Acid (VITAMIN C) 100 MG tablet Take 1 tablet by mouth daily.  Marland Kitchen. aspirin 81 MG tablet Take 1 tablet by mouth daily.  Marland Kitchen. atorvastatin (LIPITOR) 40 MG tablet TAKE 1 TABLET AT BEDTIME FOR CHOLESTEROL  . calcium-vitamin D (OSCAL 500/200 D-3) 500-200 MG-UNIT per tablet Take 1 tablet by mouth daily with breakfast.   . colchicine 0.6 MG tablet 2 tablets at first sign of gout, then one daily as needed  . gabapentin (NEURONTIN) 100 MG capsule Take 1 capsule (100 mg  total) by mouth 2 (two) times daily.  . indomethacin (INDOCIN) 25 MG capsule Take 1-2 capsules (25-50 mg total) by mouth 2 (two) times daily with a meal. For gout (Patient taking differently: Take 25-50 mg by mouth 2 (two) times daily with a meal. For gout)  . metoprolol succinate (TOPROL-XL) 50 MG 24 hr tablet TAKE 1 TABLET EVERY DAY FOR BLOOD PRESSURE  . MULTIPLE VITAMIN PO Take 1 tablet by mouth daily.  Marland Kitchen triamterene-hydrochlorothiazide (MAXZIDE) 75-50 MG tablet TAKE 1 TABLET EVERY DAY  . Vitamin E 400 UNITS TABS Take 1 tablet by mouth daily.   No facility-administered encounter medications on file as of 01/15/2018.     Activities of Daily Living In your present  state of health, do you have any difficulty performing the following activities: 01/15/2018  Hearing? N  Vision? N  Difficulty concentrating or making decisions? N  Walking or climbing stairs? N  Dressing or bathing? N  Doing errands, shopping? N  Preparing Food and eating ? N  Using the Toilet? N  In the past six months, have you accidently leaked urine? Y  Comment Occasionally, wears protection when out of the house.  Do you have problems with loss of bowel control? N  Managing your Medications? N  Managing your Finances? N  Housekeeping or managing your Housekeeping? N  Some recent data might be hidden    Patient Care Team: [name], MD as PCP - General (Family Medicine)    Assessment:   This is a routine wellness examination for Dylana.  Exercise Activities and Dietary recommendations Current Exercise Habits: The patient does not participate in regular exercise at present, Exercise limited by: Other - see comments(stopped walking due to trouble with gout)  Goals    . DIET - INCREASE WATER INTAKE     Recommend increasing water intake to 4 glasses a day.        Fall Risk Fall Risk  01/15/2018 01/05/2017 09/19/2016 07/04/2016 06/22/2015  Falls in the past year? No No No No No   Is the patient's home free of loose throw rugs in walkways, pet beds, electrical cords, etc?   yes      Grab bars in the bathroom? no      Handrails on the stairs? n/a      Adequate lighting?   yes  Timed Get Up and Go performed: N/A  Depression Screen PHQ 2/9 Scores 01/15/2018 01/15/2018 01/05/2017 09/19/2016  PHQ - 2 Score 0 0 0 0  PHQ- 9 Score 0 - - 0     Cognitive Function: Pt declined screening today.      6CIT Screen 01/05/2017  What Year? 0 points  What month? 0 points  What time? 0 points  Count back from 20 0 points  Months in reverse 4 points  Repeat phrase 2 points  Total Score 6    Immunization History  Administered Date(s) Administered  . Influenza, High Dose Seasonal  PF 09/30/2015, 09/19/2016, 09/11/2017  . Pneumococcal Conjugate-13 09/02/2014  . Pneumococcal Polysaccharide-23 01/05/2017  . Tdap 08/28/2012  . Zoster 08/28/2012    Qualifies for Shingles Vaccine? Due for Shingles vaccine. Declined my offer to administer today. Education has been provided regarding the importance of this vaccine. Pt has been advised to call her insurance company to determine her out of pocket expense. Advised she may also receive this vaccine at her local pharmacy or Health Dept. Verbalized acceptance and understanding.  Screening Tests Health Maintenance  Topic Date Due  .  FOOT EXAM  09/02/1946  . OPHTHALMOLOGY EXAM  09[birth year]02/1946  . URINE MICROALBUMIN  09[birth year]02/1948  . HEMOGLOBIN A1C  03/12/2018  . DEXA SCAN  06/17/2018  . TETANUS/TDAP  08/28/2022  . INFLUENZA VACCINE  Completed  . PNA vac Low Risk Adult  Completed    Cancer Screenings: Lung: Low Dose CT Chest recommended if Age 65-80 years, 30 pack-year currently smoking OR have quit w/in 15years. Patient does not qualify. Breast:  Up to date on Mammogram? Yes   Up to date of Bone Density/Dexa? Yes Colorectal: N/A  Additional Screenings:  Hepatitis B/HIV/Syphillis: Pt declines today.  Hepatitis C Screening: Pt declines today.      Plan:  I have personally reviewed and addressed the Medicare Annual Wellness questionnaire and have noted the following in the patient's chart:  A. Medical and social history B. Use of alcohol, tobacco or illicit drugs  C. Current medications and supplements D. Functional ability and status E.  Nutritional status F.  Physical activity G. Advance directives H. List of other physicians I.  Hospitalizations, surgeries, and ER visits in previous 12 months J.  Vitals K. Screenings such as hearing and vision if needed, cognitive and depression L. Referrals and appointments - none  In addition, I have reviewed and discussed with patient certain preventive protocols, quality metrics,  and best practice recommendations. A written personalized care plan for preventive services as well as general preventive health recommendations were provided to patient.  See attached scanned questionnaire for additional information.   Signed,  Hyacinth Meeker, LPN Nurse Health Advisor   Nurse Recommendations: Pt needs a micro albumin checked today.  Per pt she is NOT a diabetic. Per HM it states pt is due for a diabetic foot exam and diabetic eye exam. Pt would like these removed if not neccessary. FYI!

## 2018-01-15 NOTE — Progress Notes (Signed)
Patient: Teresa Shields, Female    DOB: 1936/06/28, 82 y.o.   MRN: [id number] Visit Date: 01/15/2018  Today's Provider: Mila Merry, MD   Chief Complaint  Patient presents with  . Annual Exam  . Diabetes  . Hypertension   Subjective:   Patient saw McKenzie today at 1:30 pm for AWV.   Complete Physical Teresa Shields is a 82 y.o. female. She feels well. She reports exercising; yes/walking. She reports she is sleeping well.  -----------------------------------------------------------  Diabetes Mellitus Type II, Follow-up:   Lab Results  Component Value Date   HGBA1C 6.7 09/11/2017   HGBA1C 6.5 05/01/2017   HGBA1C 6.5 (H) 01/12/2017   Last seen for diabetes 4 months ago.  Management since then includes; changed gabapentin to bid for neuropathy. She reports good compliance with treatment. She is not having side effects. none Current symptoms include none and have been unchanged. Home blood sugar records: fasting range: not checking  Episodes of hypoglycemia? no   Current Insulin Regimen: n/a Most Recent Eye Exam: Due Weight trend: stable Prior visit with dietician: no Current diet: well balanced Current exercise: walking  ----------------------------------------------------------------    Hypertension, follow-up:  BP Readings from Last 3 Encounters:  01/15/18 136/80  09/11/17 110/64  05/01/17 138/66    She was last seen for hypertension 9 months ago.  BP at that visit was 138/66. Management since that visit includes; no changes.She reports good compliance with treatment. She is not having side effects. none She is exercising. She is adherent to low salt diet.   Outside blood pressures are 122/69. She is experiencing none.  Patient denies none.   Cardiovascular risk factors include diabetes mellitus.  Use of agents associated with hypertension: none.   ----------------------------------------------------------------  Gout of foot,  unspecified cause, unspecified chronicity, unspecified laterality From 05/01/2017-no changes. Fairly well controlled on current dose of allopurinol.  Follow up neuropathy States that she continues to have constant burning and slippery feeling of both hands. Pain is all along backs and sides of hands and fingers. Has tried meloxicam with no relief. Slight improvement with gabapentin. Had normal RG, AN, sed rate and uric acid July 2017. No joint swelling or erythema.   Review of Systems  Constitutional: Negative for chills, diaphoresis and fever.  HENT: Negative for congestion, ear discharge, ear pain, hearing loss, nosebleeds, sore throat and tinnitus.   Eyes: Negative for photophobia, pain, discharge and redness.  Respiratory: Negative for cough, shortness of breath, wheezing and stridor.   Cardiovascular: Negative for chest pain, palpitations and leg swelling.  Gastrointestinal: Negative for abdominal pain, blood in stool, constipation, diarrhea, nausea and vomiting.  Endocrine: Negative for polydipsia.  Genitourinary: Negative for dysuria, flank pain, frequency, hematuria and urgency.  Musculoskeletal: Negative for back pain, myalgias and neck pain.  Skin: Negative for rash.  Allergic/Immunologic: Negative for environmental allergies.  Neurological: Negative for dizziness, tremors, seizures, weakness and headaches.  Hematological: Does not bruise/bleed easily.  Psychiatric/Behavioral: Negative for hallucinations and suicidal ideas. The patient is not nervous/anxious.     Social History   Socioeconomic History  . Marital status: Widowed    Spouse name: Not on file  . Number of children: 2  . Years of education: Not on file  . Highest education level: 12th grade  Social Needs  . Financial resource strain: Not hard at all  . Food insecurity - worry: Never true  . Food insecurity - inability: Never true  . Transportation needs - medical:  No  . Transportation needs - non-medical: No    Occupational History  . Occupation: retired  Tobacco Use  . Smoking status: Never Smoker  . Smokeless tobacco: Never Used  Substance and Sexual Activity  . Alcohol use: No  . Drug use: No  . Sexual activity: Not on file  Other Topics Concern  . Not on file  Social History Narrative  . Not on file    Past Medical History:  Diagnosis Date  . Gout   . History of chicken pox   . Hyperlipidemia   . Hypertension      Patient Active Problem List   Diagnosis Date Noted  . Arthralgia 07/04/2016  . Edema 07/04/2016  . Osteopenia 06/27/2015  . Grief 06/18/2015  . Type 2 diabetes mellitus with diabetic neuropathy, without long-term current use of insulin (HCC) 06/18/2015  . Hypokalemia 06/18/2015  . Lumbago 06/18/2015  . Neuralgia 06/18/2015  . Allergic rhinitis 03/18/2010  . Hypercalcemia 09/22/2009  . Sacroiliac inflammation (HCC) 05/12/2008  . Gout 01/15/2008  . Hyperlipidemia, mixed 01/15/2008  . Hypertension, essential, benign 01/15/2008  . Malignant neoplasm of skin of leg 01/15/2008    Past Surgical History:  Procedure Laterality Date  . Bone Density Study  01/08/2011   T-1.2, L-Spine, T-1.9 Osteopenia  . Left hip skin cancer resection  2010   Dermatology    Her family history includes AAA (abdominal aortic aneurysm) in her sister; Gout in her other; Healthy in her father and mother.      Current Outpatient Medications:  .  allopurinol (ZYLOPRIM) 300 MG tablet, TAKE 1 TABLET EVERY DAY, Disp: 90 tablet, Rfl: 3 .  Ascorbic Acid (VITAMIN C) 100 MG tablet, Take 1 tablet by mouth daily., Disp: , Rfl:  .  aspirin 81 MG tablet, Take 1 tablet by mouth daily., Disp: , Rfl:  .  atorvastatin (LIPITOR) 40 MG tablet, TAKE 1 TABLET AT BEDTIME FOR CHOLESTEROL, Disp: 90 tablet, Rfl: 4 .  calcium-vitamin D (OSCAL 500/200 D-3) 500-200 MG-UNIT per tablet, Take 1 tablet by mouth daily with breakfast. , Disp: , Rfl:  .  colchicine 0.6 MG tablet, 2 tablets at first sign of gout,  then one daily as needed, Disp: 90 tablet, Rfl: 1 .  gabapentin (NEURONTIN) 100 MG capsule, Take 1 capsule (100 mg total) by mouth 2 (two) times daily., Disp: 180 capsule, Rfl: 1 .  indomethacin (INDOCIN) 25 MG capsule, Take 1-2 capsules (25-50 mg total) by mouth 2 (two) times daily with a meal. For gout (Patient taking differently: Take 25-50 mg by mouth 2 (two) times daily with a meal. For gout), Disp: 30 capsule, Rfl: 1 .  metoprolol succinate (TOPROL-XL) 50 MG 24 hr tablet, TAKE 1 TABLET EVERY DAY FOR BLOOD PRESSURE, Disp: 90 tablet, Rfl: 4 .  MULTIPLE VITAMIN PO, Take 1 tablet by mouth daily., Disp: , Rfl:  .  triamterene-hydrochlorothiazide (MAXZIDE) 75-50 MG tablet, TAKE 1 TABLET EVERY DAY, Disp: 90 tablet, Rfl: 4 .  Vitamin E 400 UNITS TABS, Take 1 tablet by mouth daily., Disp: , Rfl:   Patient Care Team: [name], MD as PCP - General (Family Medicine)     Objective:   BP  136/80 (BP Location: Right Arm)     Pulse  60     Temp  98.1 F (36.7 C) (Oral)     Ht  5\' 6"  (1.676 m)     Wt  170 lb 6.4 oz (77.3 kg)      BMI  27.50 kg/m        Physical Exam   General Appearance:    Alert, cooperative, no distress, appears stated age  Head:    Normocephalic, without obvious abnormality, atraumatic  Eyes:    PERRL, conjunctiva/corneas clear, EOM's intact, fundi    benign, both eyes  Ears:    Normal TM's and external ear canals, both ears  Nose:   Nares normal, septum midline, mucosa normal, no drainage    or sinus tenderness  Throat:   Lips, mucosa, and tongue normal; teeth and gums normal  Neck:   Supple, symmetrical, trachea midline, no adenopathy;    thyroid:  no enlargement/tenderness/nodules; no carotid   bruit or JVD  Back:     Symmetric, no curvature, ROM normal, no CVA tenderness  Lungs:     Clear to auscultation bilaterally, respirations unlabored  Chest Wall:    No tenderness or deformity   Heart:    Regular rate and rhythm, S1 and S2 normal, no  murmur, rub   or gallop  Breast Exam:    refused  Abdomen:     Soft, non-tender, bowel sounds active all four quadrants,    no masses, no organomegaly  Pelvic:    deferred  Extremities:   Extremities normal, atraumatic, no cyanosis or edema  Pulses:   2+ and symmetric all extremities  Skin:   Skin color, texture, turgor normal, no rashes or lesions  Lymph nodes:   Cervical, supraclavicular, and axillary nodes normal  Neurologic:   CNII-XII intact, normal strength, sensation and reflexes    throughout    Activities of Daily Living In your present state of health, do you have any difficulty performing the following activities: 01/15/2018  Hearing? N  Vision? N  Difficulty concentrating or making decisions? N  Walking or climbing stairs? N  Dressing or bathing? N  Doing errands, shopping? N  Preparing Food and eating ? N  Using the Toilet? N  In the past six months, have you accidently leaked urine? Y  Comment Occasionally, wears protection when out of the house.  Do you have problems with loss of bowel control? N  Managing your Medications? N  Managing your Finances? N  Housekeeping or managing your Housekeeping? N  Some recent data might be hidden    Fall Risk Assessment Fall Risk  01/15/2018 01/05/2017 09/19/2016 07/04/2016 06/22/2015  Falls in the past year? No No No No No     Depression Screen PHQ 2/9 Scores 01/15/2018 01/15/2018 01/05/2017 09/19/2016  PHQ - 2 Score 0 0 0 0  PHQ- 9 Score 0 - - 0      Assessment & Plan:    Annual Physical Reviewed patient's Family Medical History Reviewed and updated list of patient's medical providers Assessment of cognitive impairment was done Assessed patient's functional ability Established a written schedule for health screening services Health Risk Assessent Completed and Reviewed  Exercise Activities and Dietary recommendations Goals    . DIET - INCREASE WATER INTAKE     Recommend increasing water intake to 4 glasses a day.          Immunization History  Administered Date(s) Administered  . Influenza, High Dose Seasonal PF 09/30/2015, 09/19/2016, 09/11/2017  . Pneumococcal Conjugate-13 09/02/2014  . Pneumococcal Polysaccharide-23 01/05/2017  . Tdap 08/28/2012  . Zoster 08/28/2012    Health Maintenance  Topic Date Due  . FOOT EXAM  09/02/1948  . OPHTHALMOLOGY EXAM  09/02/1948  . URINE MICROALBUMIN  09/02/1948  . HEMOGLOBIN A1C  03/12/2018  . DEXA SCAN  06/17/2018  . TETANUS/TDAP  08/28/2022  . INFLUENZA VACCINE  Completed  . PNA vac Low Risk Adult  Completed     Discussed health benefits of physical activity, and encouraged her to engage in regular exercise appropriate for her age and condition.    -------------------------------------------------------------------------- 1. Annual physical exam Normal exam.   2. BMI 27.0-27.9,adult Counseled regarding prudent diet and regular exercise.    3. Type 2 diabetes mellitus with diabetic neuropathy, without long-term current use of insulin (HCC) Diet controlled.  - Urine Microalbumin w/creat. ratio - EKG 12-Lead - Hemoglobin A1c - Vitamin B12  4. Hypertension, essential, benign Well controlled.  Continue current medications.   - EKG 12-Lead  5. Osteopenia, unspecified location  - VITAMIN D 25 Hydroxy (Vit-D Deficiency, Fractures)  6. Hyperlipidemia, mixed She is tolerating atorvastatin well with no adverse effects.   - EKG 12-Lead - Lipid panel - Comprehensive metabolic panel  7. Estrogen deficiency BMD  8. Neuropathy Not sure if pains in hands are entirely due to neuropathy or rheumatological. Is affecting entire hand and fingers, not just joints. Minimal improvement with gabapentin.  If labs normal will consider neurology referral.  - VITAMIN D 25 Hydroxy (Vit-D Deficiency, Fractures) - Vitamin B12    Mila Merryonald [name], MD  [facility]

## 2018-01-15 NOTE — Patient Instructions (Signed)
[name] , Thank you for taking time to come for your Medicare Wellness Visit. I appreciate your ongoing commitment to your health goals. Please review the following plan we discussed and let me know if I can assist you in the future.   Screening recommendations/referrals: Colonoscopy: N/A Mammogram: Up to date Bone Density: Up to date Recommended yearly ophthalmology/optometry visit for glaucoma screening and checkup Recommended yearly dental visit for hygiene and checkup  Vaccinations: Influenza vaccine: Up to date Pneumococcal vaccine: Up to date Tdap vaccine: Up to date Shingles vaccine: Zostavax completed 08/2012. Pt declined the Shingrix today.     Advanced directives: Advance directive discussed with you today. Even though you declined this today please call our office should you change your mind and we can give you the proper paperwork for you to fill out.  Conditions/risks identified: Recommend increasing water intake to 4 glasses a day.   Next appointment: 2:00 PM today   Preventive Care 65 Years and Older, Female Preventive care refers to lifestyle choices and visits with your health care provider that can promote health and wellness. What does preventive care include?  A yearly physical exam. This is also called an annual well check.  Dental exams once or twice a year.  Routine eye exams. Ask your health care provider how often you should have your eyes checked.  Personal lifestyle choices, including:  Daily care of your teeth and gums.  Regular physical activity.  Eating a healthy diet.  Avoiding tobacco and drug use.  Limiting alcohol use.  Practicing safe sex.  Taking low-dose aspirin every day.  Taking vitamin and mineral supplements as recommended by your health care provider. What happens during an annual well check? The services and screenings done by your health care provider during your annual well check will depend on your age, overall  health, lifestyle risk factors, and family history of disease. Counseling  Your health care provider may ask you questions about your:  Alcohol use.  Tobacco use.  Drug use.  Emotional well-being.  Home and relationship well-being.  Sexual activity.  Eating habits.  History of falls.  Memory and ability to understand (cognition).  Work and work Astronomerenvironment.  Reproductive health. Screening  You may have the following tests or measurements:  Height, weight, and BMI.  Blood pressure.  Lipid and cholesterol levels. These may be checked every 5 years, or more frequently if you are over 80 years old.  Skin check.  Lung cancer screening. You may have this screening every year starting at age 80 if you have a 30-pack-year history of smoking and currently smoke or have quit within the past 15 years.  Fecal occult blood test (FOBT) of the stool. You may have this test every year starting at age 80.  Flexible sigmoidoscopy or colonoscopy. You may have a sigmoidoscopy every 5 years or a colonoscopy every 10 years starting at age 80.  Hepatitis C blood test.  Hepatitis B blood test.  Sexually transmitted disease (STD) testing.  Diabetes screening. This is done by checking your blood sugar (glucose) after you have not eaten for a while (fasting). You may have this done every 1-3 years.  Bone density scan. This is done to screen for osteoporosis. You may have this done starting at age 80.  Mammogram. This may be done every 1-2 years. Talk to your health care provider about how often you should have regular mammograms. Talk with your health care provider about your test results, treatment options, and  if necessary, the need for more tests. Vaccines  Your health care provider may recommend certain vaccines, such as:  Influenza vaccine. This is recommended every year.  Tetanus, diphtheria, and acellular pertussis (Tdap, Td) vaccine. You may need a Td booster every 10  years.  Zoster vaccine. You may need this after age 52.  Pneumococcal 13-valent conjugate (PCV13) vaccine. One dose is recommended after age 82.  Pneumococcal polysaccharide (PPSV23) vaccine. One dose is recommended after age 40. Talk to your health care provider about which screenings and vaccines you need and how often you need them. This information is not intended to replace advice given to you by your health care provider. Make sure you discuss any questions you have with your health care provider. Document Released: 12/24/2015 Document Revised: 08/16/2016 Document Reviewed: 09/28/2015 Elsevier Interactive Patient Education  2017 [place] Prevention in the Home Falls can cause injuries. They can happen to people of all ages. There are many things you can do to make your home safe and to help prevent falls. What can I do on the outside of my home?  Regularly fix the edges of walkways and driveways and fix any cracks.  Remove anything that might make you trip as you walk through a door, such as a raised step or threshold.  Trim any bushes or trees on the path to your home.  Use bright outdoor lighting.  Clear any walking paths of anything that might make someone trip, such as rocks or tools.  Regularly check to see if handrails are loose or broken. Make sure that both sides of any steps have handrails.  Any raised decks and porches should have guardrails on the edges.  Have any leaves, snow, or ice cleared regularly.  Use sand or salt on walking paths during winter.  Clean up any spills in your garage right away. This includes oil or grease spills. What can I do in the bathroom?  Use night lights.  Install grab bars by the toilet and in the tub and shower. Do not use towel bars as grab bars.  Use non-skid mats or decals in the tub or shower.  If you need to sit down in the shower, use a plastic, non-slip stool.  Keep the floor dry. Clean up any water that  spills on the floor as soon as it happens.  Remove soap buildup in the tub or shower regularly.  Attach bath mats securely with double-sided non-slip rug tape.  Do not have throw rugs and other things on the floor that can make you trip. What can I do in the bedroom?  Use night lights.  Make sure that you have a light by your bed that is easy to reach.  Do not use any sheets or blankets that are too big for your bed. They should not hang down onto the floor.  Have a firm chair that has side arms. You can use this for support while you get dressed.  Do not have throw rugs and other things on the floor that can make you trip. What can I do in the kitchen?  Clean up any spills right away.  Avoid walking on wet floors.  Keep items that you use a lot in easy-to-reach places.  If you need to reach something above you, use a strong step stool that has a grab bar.  Keep electrical cords out of the way.  Do not use floor polish or wax that makes floors slippery. If you  must use wax, use non-skid floor wax.  Do not have throw rugs and other things on the floor that can make you trip. What can I do with my stairs?  Do not leave any items on the stairs.  Make sure that there are handrails on both sides of the stairs and use them. Fix handrails that are broken or loose. Make sure that handrails are as long as the stairways.  Check any carpeting to make sure that it is firmly attached to the stairs. Fix any carpet that is loose or worn.  Avoid having throw rugs at the top or bottom of the stairs. If you do have throw rugs, attach them to the floor with carpet tape.  Make sure that you have a light switch at the top of the stairs and the bottom of the stairs. If you do not have them, ask someone to add them for you. What else can I do to help prevent falls?  Wear shoes that:  Do not have high heels.  Have rubber bottoms.  Are comfortable and fit you well.  Are closed at the  toe. Do not wear sandals.  If you use a stepladder:  Make sure that it is fully opened. Do not climb a closed stepladder.  Make sure that both sides of the stepladder are locked into place.  Ask someone to hold it for you, if possible.  Clearly mark and make sure that you can see:  Any grab bars or handrails.  First and last steps.  Where the edge of each step is.  Use tools that help you move around (mobility aids) if they are needed. These include:  Canes.  Walkers.  Scooters.  Crutches.  Turn on the lights when you go into a dark area. Replace any light bulbs as soon as they burn out.  Set up your furniture so you have a clear path. Avoid moving your furniture around.  If any of your floors are uneven, fix them.  If there are any pets around you, be aware of where they are.  Review your medicines with your doctor. Some medicines can make you feel dizzy. This can increase your chance of falling. Ask your doctor what other things that you can do to help prevent falls. This information is not intended to replace advice given to you by your health care provider. Make sure you discuss any questions you have with your health care provider. Document Released: 09/23/2009 Document Revised: 05/04/2016 Document Reviewed: 01/01/2015 Elsevier Interactive Patient Education  2017 [employer].

## 2018-01-16 LAB — MICROALBUMIN / CREATININE URINE RATIO
CREATININE, UR: 48.2 mg/dL
MICROALBUM., U, RANDOM: 21.4 ug/mL
Microalb/Creat Ratio: 44.4 mg/g creat — ABNORMAL HIGH (ref 0.0–30.0)

## 2018-01-17 ENCOUNTER — Encounter: Payer: Self-pay | Admitting: Family Medicine

## 2018-01-17 DIAGNOSIS — R809 Proteinuria, unspecified: Secondary | ICD-10-CM | POA: Insufficient documentation

## 2018-01-17 DIAGNOSIS — E1121 Type 2 diabetes mellitus with diabetic nephropathy: Secondary | ICD-10-CM | POA: Insufficient documentation

## 2018-01-21 DIAGNOSIS — G629 Polyneuropathy, unspecified: Secondary | ICD-10-CM | POA: Diagnosis not present

## 2018-01-21 DIAGNOSIS — M8580 Other specified disorders of bone density and structure, unspecified site: Secondary | ICD-10-CM | POA: Diagnosis not present

## 2018-01-21 DIAGNOSIS — E1140 Type 2 diabetes mellitus with diabetic neuropathy, unspecified: Secondary | ICD-10-CM | POA: Diagnosis not present

## 2018-01-21 DIAGNOSIS — E782 Mixed hyperlipidemia: Secondary | ICD-10-CM | POA: Diagnosis not present

## 2018-01-22 LAB — COMPREHENSIVE METABOLIC PANEL
ALBUMIN: 4.1 g/dL (ref 3.5–4.8)
ALT: 23 IU/L (ref 0–32)
AST: 15 IU/L (ref 0–40)
Albumin/Globulin Ratio: 2.1 (ref 1.2–2.2)
Alkaline Phosphatase: 119 IU/L — ABNORMAL HIGH (ref 39–117)
BUN / CREAT RATIO: 19 (ref 12–28)
BUN: 16 mg/dL (ref 8–27)
Bilirubin Total: 0.4 mg/dL (ref 0.0–1.2)
CO2: 26 mmol/L (ref 20–29)
Calcium: 9.1 mg/dL (ref 8.7–10.3)
Chloride: 100 mmol/L (ref 96–106)
Creatinine, Ser: 0.86 mg/dL (ref 0.57–1.00)
GFR, EST AFRICAN AMERICAN: 74 mL/min/{1.73_m2} (ref >59)
GFR, EST NON AFRICAN AMERICAN: 64 mL/min/{1.73_m2} (ref >59)
GLUCOSE: 160 mg/dL — AB (ref 65–99)
Globulin, Total: 2 g/dL (ref 1.5–4.5)
Potassium: 3.6 mmol/L (ref 3.5–5.2)
Sodium: 144 mmol/L (ref 134–144)
TOTAL PROTEIN: 6.1 g/dL (ref 6.0–8.5)

## 2018-01-22 LAB — LIPID PANEL
CHOL/HDL RATIO: 4.3 ratio (ref 0.0–4.4)
Cholesterol, Total: 156 mg/dL (ref 100–199)
HDL: 36 mg/dL — ABNORMAL LOW (ref >39)
LDL Calculated: 50 mg/dL (ref 0–99)
Triglycerides: 349 mg/dL — ABNORMAL HIGH (ref 0–149)
VLDL Cholesterol Cal: 70 mg/dL — ABNORMAL HIGH (ref 5–40)

## 2018-01-22 LAB — VITAMIN D 25 HYDROXY (VIT D DEFICIENCY, FRACTURES): Vit D, 25-Hydroxy: 37 ng/mL (ref 30.0–100.0)

## 2018-01-22 LAB — HEMOGLOBIN A1C
ESTIMATED AVERAGE GLUCOSE: 146 mg/dL
Hgb A1c MFr Bld: 6.7 % — ABNORMAL HIGH (ref 4.8–5.6)

## 2018-01-22 LAB — VITAMIN B12: VITAMIN B 12: 689 pg/mL (ref 232–1245)

## 2018-01-23 ENCOUNTER — Telehealth: Payer: Self-pay

## 2018-01-23 DIAGNOSIS — G629 Polyneuropathy, unspecified: Secondary | ICD-10-CM

## 2018-01-23 NOTE — Telephone Encounter (Signed)
-----   Message from Malva Limesonald E [name], MD sent at 01/22/2018  7:50 AM EST ----- Cholesterol is well controlled at 156. Average sugar is stablet at 146. Rest of labs are normal. Recommend referral to neurology for neuropathy. Need to schedule follow up here in 6 months for diabetes.

## 2018-01-23 NOTE — Telephone Encounter (Signed)
Advised patient of results. Referral was placed. Patient wanted Tuesday appts only.

## 2018-02-05 DIAGNOSIS — R202 Paresthesia of skin: Secondary | ICD-10-CM | POA: Diagnosis not present

## 2018-02-05 DIAGNOSIS — E1142 Type 2 diabetes mellitus with diabetic polyneuropathy: Secondary | ICD-10-CM | POA: Diagnosis not present

## 2018-02-05 DIAGNOSIS — R200 Anesthesia of skin: Secondary | ICD-10-CM | POA: Diagnosis not present

## 2018-02-12 DIAGNOSIS — G629 Polyneuropathy, unspecified: Secondary | ICD-10-CM | POA: Diagnosis not present

## 2018-02-12 DIAGNOSIS — G5603 Carpal tunnel syndrome, bilateral upper limbs: Secondary | ICD-10-CM | POA: Diagnosis not present

## 2018-02-13 ENCOUNTER — Other Ambulatory Visit: Payer: Self-pay

## 2018-02-19 DIAGNOSIS — G5603 Carpal tunnel syndrome, bilateral upper limbs: Secondary | ICD-10-CM | POA: Diagnosis not present

## 2018-03-03 ENCOUNTER — Other Ambulatory Visit: Payer: Self-pay | Admitting: Family Medicine

## 2018-03-03 DIAGNOSIS — E1140 Type 2 diabetes mellitus with diabetic neuropathy, unspecified: Secondary | ICD-10-CM

## 2018-03-08 ENCOUNTER — Encounter: Payer: Self-pay | Admitting: Family Medicine

## 2018-03-08 DIAGNOSIS — G629 Polyneuropathy, unspecified: Secondary | ICD-10-CM | POA: Insufficient documentation

## 2018-03-13 DIAGNOSIS — G5603 Carpal tunnel syndrome, bilateral upper limbs: Secondary | ICD-10-CM | POA: Diagnosis not present

## 2018-03-25 ENCOUNTER — Other Ambulatory Visit: Payer: Self-pay | Admitting: Family Medicine

## 2018-03-26 DIAGNOSIS — R202 Paresthesia of skin: Secondary | ICD-10-CM | POA: Diagnosis not present

## 2018-03-26 DIAGNOSIS — R200 Anesthesia of skin: Secondary | ICD-10-CM | POA: Diagnosis not present

## 2018-03-26 DIAGNOSIS — G5603 Carpal tunnel syndrome, bilateral upper limbs: Secondary | ICD-10-CM | POA: Diagnosis not present

## 2018-03-26 DIAGNOSIS — E1142 Type 2 diabetes mellitus with diabetic polyneuropathy: Secondary | ICD-10-CM | POA: Diagnosis not present

## 2018-03-26 DIAGNOSIS — G629 Polyneuropathy, unspecified: Secondary | ICD-10-CM | POA: Diagnosis not present

## 2018-04-02 ENCOUNTER — Emergency Department
Admission: EM | Admit: 2018-04-02 | Discharge: 2018-04-02 | Disposition: A | Discharge status: Home / Self Care | Payer: Medicare HMO | Attending: Emergency Medicine | Admitting: Emergency Medicine

## 2018-04-02 ENCOUNTER — Other Ambulatory Visit: Payer: Self-pay

## 2018-04-02 ENCOUNTER — Emergency Department: Payer: Medicare HMO

## 2018-04-02 DIAGNOSIS — E119 Type 2 diabetes mellitus without complications: Secondary | ICD-10-CM | POA: Diagnosis not present

## 2018-04-02 DIAGNOSIS — Y929 Unspecified place or not applicable: Secondary | ICD-10-CM | POA: Insufficient documentation

## 2018-04-02 DIAGNOSIS — Y999 Unspecified external cause status: Secondary | ICD-10-CM | POA: Insufficient documentation

## 2018-04-02 DIAGNOSIS — S8291XA Unspecified fracture of right lower leg, initial encounter for closed fracture: Secondary | ICD-10-CM | POA: Diagnosis not present

## 2018-04-02 DIAGNOSIS — M6281 Muscle weakness (generalized): Secondary | ICD-10-CM | POA: Diagnosis not present

## 2018-04-02 DIAGNOSIS — R42 Dizziness and giddiness: Secondary | ICD-10-CM | POA: Diagnosis not present

## 2018-04-02 DIAGNOSIS — Z79899 Other long term (current) drug therapy: Secondary | ICD-10-CM | POA: Diagnosis not present

## 2018-04-02 DIAGNOSIS — X500XXA Overexertion from strenuous movement or load, initial encounter: Secondary | ICD-10-CM | POA: Insufficient documentation

## 2018-04-02 DIAGNOSIS — S8251XA Displaced fracture of medial malleolus of right tibia, initial encounter for closed fracture: Secondary | ICD-10-CM | POA: Diagnosis not present

## 2018-04-02 DIAGNOSIS — N289 Disorder of kidney and ureter, unspecified: Secondary | ICD-10-CM | POA: Diagnosis not present

## 2018-04-02 DIAGNOSIS — I10 Essential (primary) hypertension: Secondary | ICD-10-CM | POA: Diagnosis not present

## 2018-04-02 DIAGNOSIS — E860 Dehydration: Secondary | ICD-10-CM | POA: Diagnosis not present

## 2018-04-02 DIAGNOSIS — Y9301 Activity, walking, marching and hiking: Secondary | ICD-10-CM | POA: Diagnosis not present

## 2018-04-02 DIAGNOSIS — S82891A Other fracture of right lower leg, initial encounter for closed fracture: Secondary | ICD-10-CM

## 2018-04-02 LAB — URINALYSIS, COMPLETE (UACMP) WITH MICROSCOPIC
BILIRUBIN URINE: NEGATIVE
GLUCOSE, UA: NEGATIVE mg/dL
KETONES UR: NEGATIVE mg/dL
Nitrite: NEGATIVE
Protein, ur: NEGATIVE mg/dL
Specific Gravity, Urine: 1.012 (ref 1.005–1.030)
pH: 7 (ref 5.0–8.0)

## 2018-04-02 LAB — COMPREHENSIVE METABOLIC PANEL
ALT: 30 U/L (ref 14–54)
AST: 40 U/L (ref 15–41)
Albumin: 4.1 g/dL (ref 3.5–5.0)
Alkaline Phosphatase: 105 U/L (ref 38–126)
Anion gap: 11 (ref 5–15)
BUN: 25 mg/dL — AB (ref 6–20)
CHLORIDE: 99 mmol/L — AB (ref 101–111)
CO2: 27 mmol/L (ref 22–32)
Calcium: 9 mg/dL (ref 8.9–10.3)
Creatinine, Ser: 1.37 mg/dL — ABNORMAL HIGH (ref 0.44–1.00)
GFR, EST AFRICAN AMERICAN: 41 mL/min — AB (ref >60)
GFR, EST NON AFRICAN AMERICAN: 36 mL/min — AB (ref >60)
Glucose, Bld: 194 mg/dL — ABNORMAL HIGH (ref 65–99)
POTASSIUM: 2.9 mmol/L — AB (ref 3.5–5.1)
SODIUM: 137 mmol/L (ref 135–145)
Total Bilirubin: 1 mg/dL (ref 0.3–1.2)
Total Protein: 6.6 g/dL (ref 6.5–8.1)

## 2018-04-02 LAB — CBC WITH DIFFERENTIAL/PLATELET
BASOS PCT: 1 %
Basophils Absolute: 0.2 10*3/uL — ABNORMAL HIGH (ref 0–0.1)
Eosinophils Absolute: 0.9 10*3/uL — ABNORMAL HIGH (ref 0–0.7)
Eosinophils Relative: 6 %
HEMATOCRIT: 42.4 % (ref 35.0–47.0)
Hemoglobin: 14.6 g/dL (ref 12.0–16.0)
Lymphocytes Relative: 18 %
Lymphs Abs: 2.8 10*3/uL (ref 1.0–3.6)
MCH: 32.3 pg (ref 26.0–34.0)
MCHC: 34.4 g/dL (ref 32.0–36.0)
MCV: 93.9 fL (ref 80.0–100.0)
MONO ABS: 0.7 10*3/uL (ref 0.2–0.9)
MONOS PCT: 5 %
Neutro Abs: 11.1 10*3/uL — ABNORMAL HIGH (ref 1.4–6.5)
Neutrophils Relative %: 70 %
Platelets: 333 10*3/uL (ref 150–440)
RBC: 4.52 MIL/uL (ref 3.80–5.20)
RDW: 15.1 % — AB (ref 11.5–14.5)
WBC: 15.7 10*3/uL — ABNORMAL HIGH (ref 3.6–11.0)

## 2018-04-02 LAB — BASIC METABOLIC PANEL
Anion gap: 9 (ref 5–15)
BUN: 21 mg/dL — AB (ref 6–20)
CALCIUM: 8.3 mg/dL — AB (ref 8.9–10.3)
CO2: 27 mmol/L (ref 22–32)
CREATININE: 1.06 mg/dL — AB (ref 0.44–1.00)
Chloride: 104 mmol/L (ref 101–111)
GFR calc Af Amer: 56 mL/min — ABNORMAL LOW (ref >60)
GFR calc non Af Amer: 49 mL/min — ABNORMAL LOW (ref >60)
GLUCOSE: 125 mg/dL — AB (ref 65–99)
Potassium: 2.9 mmol/L — ABNORMAL LOW (ref 3.5–5.1)
Sodium: 140 mmol/L (ref 135–145)

## 2018-04-02 LAB — TROPONIN I: Troponin I: <0.03 ng/mL (ref <0.03)

## 2018-04-02 MED ORDER — HYDROCODONE-ACETAMINOPHEN 5-325 MG PO TABS: 1.0000 | ORAL_TABLET | ORAL | 0 refills | Status: DC | PRN

## 2018-04-02 MED ORDER — SODIUM CHLORIDE 0.9 % IV BOLUS
1000.0000 mL | Freq: Once | INTRAVENOUS | Status: AC
  Administered 2018-04-02: 1000 mL via INTRAVENOUS

## 2018-04-02 NOTE — ED Notes (Signed)
Iv d'ced.  Pt signed esignature.  D/c inst to pt.

## 2018-04-02 NOTE — ED Provider Notes (Signed)
[facility] Emergency Department Provider Note  ____________________________________________   First MD Initiated Contact with Patient 04/02/18 1112     (approximate)  I have reviewed the triage vital signs and the nursing notes.   HISTORY  Chief Complaint Dizziness   HPI Teresa Shields is a 82 y.o. female with a history of hypertension and hyperlipidemia who is presenting to the emergency department today with lightheadedness over the past 24 hours and right ankle pain after hyper plantar flexing her right foot when she was using her soft ground.  Patient said that she had a gout flare this weekend and usually does not feel well after her flares.  She says that she normally takes ibuprofen for flares on top of her chronic allopurinol.  She says that the flare was not to the point this weekend where she required her colchicine.  She is not describing any joint pain at this time except to the right ankle where she injured herself just prior to arrival.  She says that she has not been having any nausea, vomiting or diarrhea.  No chest pain or shortness of breath.  Says that she was feeling lightheaded which is why she is trying to lowered herself to the ground when she had a hyperflexion injury.  EMS found the patient sitting on the toilet once they arrived.  Found her blood pressures to be in the 80s and 70s while sitting and then in the 90s while standing.  Patient says that her blood pressure is usually in the 130s.  Denies any pain to her right ankle at this time and says that it only hurts when she stands on it.  EMS noted a medial deformity/swelling.  Patient also recently started duloxetine in the past 2 weeks for "helping to sleep at night."  Also had carpal tunnel surgery on the right wrist 2 weeks ago but says that she has been feeling well since.  Patient denies blood in her stools.   Past Medical History:  Diagnosis Date  . Gout   . History of chicken pox     . Hyperlipidemia   . Hypertension     Patient Active Problem List   Diagnosis Date Noted  .    03/08/2018  . Microalbuminuria 01/17/2018  . Annual physical exam 01/15/2018  . Arthralgia 07/04/2016  . Edema 07/04/2016  . Osteopenia 06/27/2015  . Type 2 diabetes mellitus with diabetic neuropathy, without long-term current use of insulin (HCC) 06/18/2015  . Hypokalemia 06/18/2015  . Lumbago 06/18/2015  . Neuralgia 06/18/2015  . Allergic rhinitis 03/18/2010  . Hypercalcemia 09/22/2009  . Sacroiliac inflammation (HCC) 05/12/2008  . Gout 01/15/2008  . Hyperlipidemia, mixed 01/15/2008  . Hypertension, essential, benign 01/15/2008  . Malignant neoplasm of skin of leg 01/15/2008    Past Surgical History:  Procedure Laterality Date  . Bone Density Study  01/08/2011   T-1.2, L-Spine, T-1.9 Osteopenia  . Left hip skin cancer resection  2010   Dermatology    Prior to Admission medications   Medication Sig Start Date End Date Taking? Authorizing Provider  allopurinol (ZYLOPRIM) 300 MG tablet TAKE 1 TABLET EVERY DAY 03/04/18   Malva LimesFisher, Donald E, MD  Ascorbic Acid (VITAMIN C) 100 MG tablet Take 1 tablet by mouth daily. 01/15/08   [provider]  aspirin 81 MG tablet Take 1 tablet by mouth daily. 01/15/08   [provider]  atorvastatin (LIPITOR) 40 MG tablet TAKE 1 TABLET AT BEDTIME FOR CHOLESTEROL 03/26/18  [name], MD  calcium-vitamin D (OSCAL 500/200 D-3) 500-200 MG-UNIT per tablet Take 1 tablet by mouth daily with breakfast.  01/15/08   [provider]  colchicine 0.6 MG tablet 2 tablets at first sign of gout, then one daily as needed 06/22/15   [name], MD  gabapentin (NEURONTIN) 100 MG capsule TAKE 1 CAPSULE TWICE DAILY 03/04/18   [name], MD  indomethacin (INDOCIN) 25 MG capsule Take 1-2 capsules (25-50 mg total) by mouth 2 (two) times daily with a meal. For gout Patient taking differently: Take 25-50 mg by mouth 2 (two) times daily  with a meal. For gout 12/29/15   [name], MD  metoprolol succinate (TOPROL-XL) 50 MG 24 hr tablet TAKE 1 TABLET EVERY DAY FOR BLOOD PRESSURE 06/06/17   [name], MD  MULTIPLE VITAMIN PO Take 1 tablet by mouth daily. 01/15/08   [provider]  triamterene-hydrochlorothiazide (MAXZIDE) 75-50 MG tablet TAKE 1 TABLET EVERY DAY 08/09/17   [name], MD  Vitamin E 400 UNITS TABS Take 1 tablet by mouth daily. 01/15/08   [provider]    Allergies Naproxen  Family History  Problem Relation Age of Onset  . AAA (abdominal aortic aneurysm) Sister   . Healthy Mother   . Healthy Father   . Gout Other     Social History Social History   Tobacco Use  . Smoking status: Never Smoker  . Smokeless tobacco: Never Used  Substance Use Topics  . Alcohol use: No  . Drug use: No    Review of Systems  Constitutional: No fever/chills Eyes: No visual changes. ENT: No sore throat. Cardiovascular: Denies chest pain. Respiratory: Denies shortness of breath. Gastrointestinal: No abdominal pain.  No nausea, no vomiting.  No diarrhea.  No constipation. Genitourinary: Negative for dysuria. Musculoskeletal: Negative for back pain. Skin: Negative for rash. Neurological: Negative for headaches, focal weakness or numbness.   ____________________________________________   PHYSICAL EXAM:  VITAL SIGNS: ED Triage Vitals  Enc Vitals Group     BP 04/02/18 1117 (!) 143/61     Pulse Rate 04/02/18 1117 75     Resp 04/02/18 1117 19     Temp 04/02/18 1117 97.7 F (36.5 C)     Temp Source 04/02/18 1117 Oral     SpO2 04/02/18 1107 94 %     Weight 04/02/18 1112 170 lb (77.1 kg)     Height 04/02/18 1112 5\' 6"  (1.676 m)     Head Circumference --      Peak Flow --      Pain Score 04/02/18 1117 0     Pain Loc --      Pain Edu? --      Excl. in GC? --     Constitutional: Alert and oriented. Well appearing and in no acute distress. Eyes: Conjunctivae are normal.   Head: Atraumatic. Nose: No congestion/rhinnorhea. Mouth/Throat: Mucous membranes are moist.  Neck: No stridor.   Cardiovascular: Normal rate, regular rhythm. Grossly normal heart sounds.  Good peripheral circulation with equal and bilateral dorsalis pedis pulses. Respiratory: Normal respiratory effort.  No retractions. Lungs CTAB. Gastrointestinal: Soft and nontender. No distention.  Musculoskeletal: No joint effusions.  Patient with swelling diffusely to the right ankle but more medially over the medial malleolus.  There is no tenderness palpation to either malleoli.  No obvious deformity and the patient is able to range her foot and ankle without any pain.  She is neurovascularly intact  distal to the ankle.  Neurologic:  Normal speech and language. No gross focal neurologic deficits are appreciated. Skin:  Skin is warm, dry and intact. No rash noted. Psychiatric: Mood and affect are normal. Speech and behavior are normal.  ____________________________________________   LABS (all labs ordered are listed, but only abnormal results are displayed)  Labs Reviewed  CBC WITH DIFFERENTIAL/PLATELET - Abnormal; Notable for the following components:      Result Value   WBC 15.7 (*)    RDW 15.1 (*)    Neutro Abs 11.1 (*)    Eosinophils Absolute 0.9 (*)    Basophils Absolute 0.2 (*)    All other components within normal limits  COMPREHENSIVE METABOLIC PANEL - Abnormal; Notable for the following components:   Potassium 2.9 (*)    Chloride 99 (*)    Glucose, Bld 194 (*)    BUN 25 (*)    Creatinine, Ser 1.37 (*)    GFR calc non Af Amer 36 (*)    GFR calc Af Amer 41 (*)    All other components within normal limits  URINALYSIS, COMPLETE (UACMP) WITH MICROSCOPIC - Abnormal; Notable for the following components:   Color, Urine YELLOW (*)    APPearance CLEAR (*)    Hgb urine dipstick SMALL (*)    Leukocytes, UA TRACE (*)    Bacteria, UA RARE (*)    All other components within normal limits   BASIC METABOLIC PANEL - Abnormal; Notable for the following components:   Potassium 2.9 (*)    Glucose, Bld 125 (*)    BUN 21 (*)    Creatinine, Ser 1.06 (*)    Calcium 8.3 (*)    GFR calc non Af Amer 49 (*)    GFR calc Af Amer 56 (*)    All other components within normal limits  URINE CULTURE  TROPONIN I   ____________________________________________  EKG  ED ECG REPORT I, Arelia Longest, the attending physician, personally viewed and interpreted this ECG.   Date: 04/02/2018  EKG Time: 1110  Rate: 78  Rhythm: normal sinus rhythm  Axis: Normal  Intervals:nonspecific intraventricular conduction delay  ST&T Change: No ST segment elevation or depression.  No abnormal T wave inversion.  ____________________________________________  RADIOLOGY  Ankle x-ray with fracture of the medial malleolus and fracture arising from the posterior aspect of the distal tibia with displacement of the fracture fragments.  CT with transverse mildly displaced medial malleolar fracture with 4 mm of lateral displacement.  Mildly displaced and comminuted fracture of the posterior malleolus with 6 mm of posterior displacement.  Avulsion fracture of the anterior lateral tibia the anterior tibiofibular joint concerning for syndesmotic injury. ____________________________________________   PROCEDURES  Procedure(s) performed:     .Splint Application Date/Time: 04/02/2018 1:31 PM Performed by: Myrna Blazer, MD Authorized by: Myrna Blazer, MD   Consent:    Consent obtained:  Verbal   Consent given by:  Patient   Risks discussed:  Discoloration, pain, swelling and numbness   Alternatives discussed:  No treatment Pre-procedure details:    Sensation:  Numbness (however, the patient normally has a neuropathy and is sensate at her baseline level with minimal sensation to light touch. ) Procedure details:    Laterality:  Right   Location:  Leg   Leg:  R lower leg    Strapping: no     Cast type:  Short leg   Splint type:  Short leg   Supplies:  Ortho-Glass and cotton padding  Post-procedure details:    Pain:  Unchanged   Sensation:  Unchanged   Skin color:  Pink   Patient tolerance of procedure:  Tolerated well, no immediate complications Comments:     I was able to apply medial as well as anterior pressure during the splinting as recommended by Dr. Rosita Kea.  Patient tolerated this well, likely secondary to her neuropathy.  Neurovascularly intact at her baseline status post the procedure.  Able to range her toes.  Brisk capillary refill.  Sensate to light touch which is decreased in her baseline on the right side.  Patient denies any increased pain or discomfort after the splinting and says the splint does not feel too tight.    Critical Care performed:   ____________________________________________   INITIAL IMPRESSION / ASSESSMENT AND PLAN / ED COURSE  Pertinent labs & imaging results that were available during my care of the patient were reviewed by me and considered in my medical decision making (see chart for details).  DDX: Near syncope, anemia, kidney failure, generalized weakness, hypotension, orthostatic hypotension, ankle fracture, ankle sprain, ankle dislocation As part of my medical decision making, I reviewed the following data within the electronic MEDICAL RECORD NUMBER Notes from prior outpatient visits.  ----------------------------------------- 12:08 PM on 04/02/2018 -----------------------------------------  Ankle x-ray with medial malleolus fracture as well as lateral and posterior displacement at the mortise.  I discussed the case with Dr. Rosita Kea who recommends a small amount of anterior traction and a posterior splint.  He is concerned because there is no pain in the patient may have a neuropathy in the joint.  He recommends a CT of the ankle.  Says that she will need to follow-up in the office on Friday.  He recommends that she has not  weightbearing and that she is crutches.  ----------------------------------------- 4:03 PM on 04/02/2018 -----------------------------------------  Patient at this time comfortable in splint.  Says that she feels "good."  Blood pressure has not been hypotensive here in the emergency department.  Kidney function is improving.  She reports that the gout flare also seem to be in the right ankle, however did not injure it until today.  She knows that she was follow with Dr. Rosita Kea by Friday.  She does not think that she is able to use crutches I recommended a kneeling walker.  I gave her a prescription for this but she will also check at local medical supply stores for one.  She is understanding that she should stay hydrated and continue to lie flat if she feels lightheaded again. ____________________________________________   FINAL CLINICAL IMPRESSION(S) / ED DIAGNOSES  Ankle fracture.  Near syncope.  Dehydration.  Renal insufficiency.    NEW MEDICATIONS STARTED DURING THIS VISIT:  New Prescriptions   No medications on file     Note:  This document was prepared using Dragon voice recognition software and may include unintentional dictation errors.     Myrna Blazer, MD 04/02/18 [phone]

## 2018-04-02 NOTE — ED Notes (Signed)
Right lower leg splint applied

## 2018-04-02 NOTE — ED Notes (Addendum)
Pt still maintains the ability to move the ankle joint, and is able to move all toes, cap refill in RLE < 3 sec

## 2018-04-02 NOTE — ED Triage Notes (Signed)
Pt arrives via ems from home reports of feeling dizzy and clammy. Pt states she felt dizzy and went to sit down, denies falling and hitting her head. Ems reports right ankle deformity that was obtained when pt went to sit down while feeling dizzy. On observation pt right ankle is noticeably swollen with some slight discoloration on the inside of the ankle. NAD at this time.

## 2018-04-03 LAB — URINE CULTURE: Culture: NO GROWTH

## 2018-04-05 DIAGNOSIS — S82841A Displaced bimalleolar fracture of right lower leg, initial encounter for closed fracture: Secondary | ICD-10-CM | POA: Diagnosis not present

## 2018-04-05 DIAGNOSIS — S93431A Sprain of tibiofibular ligament of right ankle, initial encounter: Secondary | ICD-10-CM | POA: Diagnosis not present

## 2018-04-09 ENCOUNTER — Encounter
Admission: RE | Admit: 2018-04-09 | Discharge: 2018-04-09 | Disposition: A | Discharge status: Home / Self Care | Payer: Medicare HMO | Source: Ambulatory Visit | Attending: Orthopedic Surgery | Admitting: Orthopedic Surgery

## 2018-04-09 ENCOUNTER — Other Ambulatory Visit: Payer: Self-pay

## 2018-04-09 DIAGNOSIS — Z79899 Other long term (current) drug therapy: Secondary | ICD-10-CM | POA: Diagnosis not present

## 2018-04-09 DIAGNOSIS — S93431A Sprain of tibiofibular ligament of right ankle, initial encounter: Secondary | ICD-10-CM | POA: Diagnosis not present

## 2018-04-09 DIAGNOSIS — Z7982 Long term (current) use of aspirin: Secondary | ICD-10-CM | POA: Diagnosis not present

## 2018-04-09 DIAGNOSIS — E1140 Type 2 diabetes mellitus with diabetic neuropathy, unspecified: Secondary | ICD-10-CM | POA: Diagnosis not present

## 2018-04-09 DIAGNOSIS — W19XXXA Unspecified fall, initial encounter: Secondary | ICD-10-CM | POA: Diagnosis not present

## 2018-04-09 DIAGNOSIS — I10 Essential (primary) hypertension: Secondary | ICD-10-CM | POA: Diagnosis not present

## 2018-04-09 DIAGNOSIS — S82841A Displaced bimalleolar fracture of right lower leg, initial encounter for closed fracture: Secondary | ICD-10-CM | POA: Diagnosis not present

## 2018-04-09 DIAGNOSIS — E782 Mixed hyperlipidemia: Secondary | ICD-10-CM | POA: Diagnosis not present

## 2018-04-09 DIAGNOSIS — Y929 Unspecified place or not applicable: Secondary | ICD-10-CM | POA: Diagnosis not present

## 2018-04-09 HISTORY — DX: Type 2 diabetes mellitus without complications: E11.9

## 2018-04-09 LAB — POTASSIUM: Potassium: 3 mmol/L — ABNORMAL LOW (ref 3.5–5.1)

## 2018-04-09 MED ORDER — LACTATED RINGERS IV SOLN: INTRAVENOUS | Status: DC

## 2018-04-09 NOTE — Pre-Procedure Instructions (Signed)
Anesthesia:    ED ECG REPORT I, Teresa Shields, the attending physician, personally viewed and interpreted this ECG.   Date: 04/02/2018  EKG Time: 1110  Rate: 78  Rhythm: normal sinus rhythm  Axis: Normal  Intervals:nonspecific intraventricular conduction delay  ST&T Change: No ST segment elevation or depression.  No abnormal T wave inversion.

## 2018-04-09 NOTE — Pre-Procedure Instructions (Signed)
Abnormal potassium level 3.0 faxed to Dr Rosita Kea . Pt will need potassium supplement before surgery.

## 2018-04-09 NOTE — Patient Instructions (Signed)
Your procedure is scheduled on: Apr 11, 2018 THURSDAY Report to Day Surgery on the 2nd floor of [facility]. To find out your arrival time, please call [phone] between 1PM - 3PM on: Wednesday Apr 10, 2018  REMEMBER: Instructions that are not followed completely may result in serious medical risk, up to and including death; or upon the discretion of your surgeon and anesthesiologist your surgery may need to be rescheduled.  Do not eat food after midnight the night before your procedure.  No gum chewing, lozengers or hard candies.  You may however, drink CLEAR liquids up to 2 hours before you are scheduled to arrive for your surgery. Do not drink anything within 2 hours of the start of your surgery.  Clear liquids include: - water  - apple juice without pulp - clear gatorade - black coffee or tea (Do NOT add anything to the coffee or tea) Do NOT drink anything that is not on this list.  Type 1 and Type 2 diabetics should only drink water.  No Alcohol for 24 hours before or after surgery.  No Smoking including e-cigarettes for 24 hours prior to surgery.  No chewable tobacco products for at least 6 hours prior to surgery.  No nicotine patches on the day of surgery.  On the morning of surgery brush your teeth with toothpaste and water, you may rinse your mouth with mouthwash if you wish. Do not swallow any toothpaste or mouthwash.  Notify your doctor if there is any change in your medical condition (cold, fever, infection).  Do not wear jewelry, make-up, hairpins, clips or nail polish.  Do not wear lotions, powders, or perfumes. You may NOT wear deodorant.  Do not shave 48 hours prior to surgery. Men may shave face and neck.  Contacts and dentures may not be worn into surgery.  Do not bring valuables to the hospital, including drivers license, insurance or credit cards.  [facility] is not responsible for any belongings or valuables.   TAKE THESE MEDICATIONS THE  MORNING OF SURGERY: GABAPENTIN ALLOPURINOL  Use CHG [place] directed on instruction sheet.  Follow recommendations from Cardiologist, Pulmonologist or PCP regarding stopping Aspirin, Coumadin, Plavix, Eliquis, Pradaxa, or Pletal.  Stop Anti-inflammatories (NSAIDS) such as Advil, Aleve, Ibuprofen, Motrin, Naproxen, Naprosyn and Aspirin based products such as Excedrin, Goodys Powder, BC Powder. (May take Tylenol or Acetaminophen if needed.)  Stop ANY OVER THE COUNTER supplements until after surgery VIT E AND VIT C (May continue Vitamin D, Vitamin B, and multivitamin.)  Wear comfortable clothing (specific to your surgery type) to the hospital.  Plan for stool softeners for home use.  If you are being discharged the day of surgery, you will not be allowed to drive home. You will need a responsible adult to drive you home and stay with you that night.      Please call [phone] if you have any questions about these instructions.

## 2018-04-10 MED ORDER — DEXTROSE 5 % IV SOLN
2000.0000 mg | Freq: Once | INTRAVENOUS | Status: DC
  Filled 2018-04-10: qty 20

## 2018-04-11 ENCOUNTER — Encounter: Payer: Self-pay | Admitting: *Deleted

## 2018-04-11 ENCOUNTER — Ambulatory Visit: Payer: Medicare HMO | Admitting: Anesthesiology

## 2018-04-11 ENCOUNTER — Other Ambulatory Visit: Payer: Self-pay

## 2018-04-11 ENCOUNTER — Ambulatory Visit
Admission: RE | Admit: 2018-04-11 | Discharge: 2018-04-13 | Disposition: A | Discharge status: Skilled Nursing Facility | Payer: Medicare HMO | Source: Ambulatory Visit | Attending: Orthopedic Surgery | Admitting: Orthopedic Surgery

## 2018-04-11 ENCOUNTER — Ambulatory Visit: Payer: Medicare HMO

## 2018-04-11 ENCOUNTER — Encounter
Admission: RE | Disposition: A | Discharge status: Skilled Nursing Facility | Payer: Self-pay | Source: Ambulatory Visit | Attending: Orthopedic Surgery

## 2018-04-11 DIAGNOSIS — Z7982 Long term (current) use of aspirin: Secondary | ICD-10-CM | POA: Diagnosis not present

## 2018-04-11 DIAGNOSIS — S93431A Sprain of tibiofibular ligament of right ankle, initial encounter: Secondary | ICD-10-CM | POA: Diagnosis not present

## 2018-04-11 DIAGNOSIS — Z79899 Other long term (current) drug therapy: Secondary | ICD-10-CM | POA: Diagnosis not present

## 2018-04-11 DIAGNOSIS — E1140 Type 2 diabetes mellitus with diabetic neuropathy, unspecified: Secondary | ICD-10-CM | POA: Diagnosis not present

## 2018-04-11 DIAGNOSIS — W19XXXA Unspecified fall, initial encounter: Secondary | ICD-10-CM | POA: Insufficient documentation

## 2018-04-11 DIAGNOSIS — Z419 Encounter for procedure for purposes other than remedying health state, unspecified: Secondary | ICD-10-CM

## 2018-04-11 DIAGNOSIS — E782 Mixed hyperlipidemia: Secondary | ICD-10-CM | POA: Diagnosis not present

## 2018-04-11 DIAGNOSIS — S82843A Displaced bimalleolar fracture of unspecified lower leg, initial encounter for closed fracture: Secondary | ICD-10-CM | POA: Diagnosis present

## 2018-04-11 DIAGNOSIS — I10 Essential (primary) hypertension: Secondary | ICD-10-CM | POA: Diagnosis not present

## 2018-04-11 DIAGNOSIS — S82841A Displaced bimalleolar fracture of right lower leg, initial encounter for closed fracture: Secondary | ICD-10-CM | POA: Diagnosis not present

## 2018-04-11 DIAGNOSIS — Y929 Unspecified place or not applicable: Secondary | ICD-10-CM | POA: Insufficient documentation

## 2018-04-11 DIAGNOSIS — S8291XA Unspecified fracture of right lower leg, initial encounter for closed fracture: Secondary | ICD-10-CM | POA: Diagnosis not present

## 2018-04-11 DIAGNOSIS — E119 Type 2 diabetes mellitus without complications: Secondary | ICD-10-CM | POA: Diagnosis not present

## 2018-04-11 HISTORY — PX: ORIF ANKLE FRACTURE: SHX5408

## 2018-04-11 LAB — GLUCOSE, CAPILLARY
Glucose-Capillary: 141 mg/dL — ABNORMAL HIGH (ref 65–99)
Glucose-Capillary: 135 mg/dL — ABNORMAL HIGH (ref 65–99)

## 2018-04-11 LAB — POCT I-STAT 4, (NA,K, GLUC, HGB,HCT)
GLUCOSE: 146 mg/dL — AB (ref 65–99)
HCT: 38 % (ref 36.0–46.0)
Hemoglobin: 12.9 g/dL (ref 12.0–15.0)
POTASSIUM: 3.3 mmol/L — AB (ref 3.5–5.1)
Sodium: 141 mmol/L (ref 135–145)

## 2018-04-11 SURGERY — OPEN REDUCTION INTERNAL FIXATION (ORIF) ANKLE FRACTURE
Anesthesia: General | Site: Ankle | Laterality: Right | Wound class: Clean

## 2018-04-11 MED ORDER — ONDANSETRON HCL 4 MG/2ML IJ SOLN
4.0000 mg | Freq: Four times a day (QID) | INTRAMUSCULAR | Status: DC | PRN
  Administered 2018-04-12: 4 mg via INTRAVENOUS
  Filled 2018-04-11: qty 2

## 2018-04-11 MED ORDER — ONDANSETRON HCL 4 MG/2ML IJ SOLN
INTRAMUSCULAR | Status: DC | PRN
  Administered 2018-04-11: 4 mg via INTRAVENOUS

## 2018-04-11 MED ORDER — PHENYLEPHRINE HCL 10 MG/ML IJ SOLN
INTRAMUSCULAR | Status: DC | PRN
  Administered 2018-04-11 (×3): 100 ug via INTRAVENOUS

## 2018-04-11 MED ORDER — LIDOCAINE HCL (CARDIAC) PF 100 MG/5ML IV SOSY
PREFILLED_SYRINGE | INTRAVENOUS | Status: DC | PRN
  Administered 2018-04-11: 80 mg via INTRAVENOUS

## 2018-04-11 MED ORDER — PROPOFOL 10 MG/ML IV BOLUS
INTRAVENOUS | Status: DC | PRN
  Administered 2018-04-11: 130 mg via INTRAVENOUS

## 2018-04-11 MED ORDER — FENTANYL CITRATE (PF) 100 MCG/2ML IJ SOLN
INTRAMUSCULAR | Status: AC
  Administered 2018-04-11: 25 ug via INTRAVENOUS
  Filled 2018-04-11: qty 2

## 2018-04-11 MED ORDER — LACTATED RINGERS IV SOLN
INTRAVENOUS | Status: DC | PRN
  Administered 2018-04-11: 10:00:00 via INTRAVENOUS

## 2018-04-11 MED ORDER — MIDAZOLAM HCL 2 MG/2ML IJ SOLN
INTRAMUSCULAR | Status: DC | PRN
  Administered 2018-04-11: 2 mg via INTRAVENOUS

## 2018-04-11 MED ORDER — FENTANYL CITRATE (PF) 100 MCG/2ML IJ SOLN
INTRAMUSCULAR | Status: DC | PRN
  Administered 2018-04-11 (×2): 25 ug via INTRAVENOUS
  Administered 2018-04-11: 50 ug via INTRAVENOUS

## 2018-04-11 MED ORDER — METOCLOPRAMIDE HCL 10 MG PO TABS: 5.0000 mg | ORAL_TABLET | Freq: Three times a day (TID) | ORAL | Status: DC | PRN

## 2018-04-11 MED ORDER — FENTANYL CITRATE (PF) 100 MCG/2ML IJ SOLN
INTRAMUSCULAR | Status: AC
  Filled 2018-04-11: qty 2

## 2018-04-11 MED ORDER — ACETAMINOPHEN 10 MG/ML IV SOLN
INTRAVENOUS | Status: DC | PRN
  Administered 2018-04-11: 1000 mg via INTRAVENOUS

## 2018-04-11 MED ORDER — DEXAMETHASONE SODIUM PHOSPHATE 10 MG/ML IJ SOLN
INTRAMUSCULAR | Status: AC
  Filled 2018-04-11: qty 1

## 2018-04-11 MED ORDER — LIDOCAINE HCL (PF) 2 % IJ SOLN
INTRAMUSCULAR | Status: AC
  Filled 2018-04-11: qty 10

## 2018-04-11 MED ORDER — MIDAZOLAM HCL 2 MG/2ML IJ SOLN
INTRAMUSCULAR | Status: AC
  Filled 2018-04-11: qty 2

## 2018-04-11 MED ORDER — ONDANSETRON HCL 4 MG/2ML IJ SOLN
INTRAMUSCULAR | Status: AC
  Filled 2018-04-11: qty 2

## 2018-04-11 MED ORDER — METOCLOPRAMIDE HCL 5 MG/ML IJ SOLN
5.0000 mg | Freq: Three times a day (TID) | INTRAMUSCULAR | Status: DC | PRN
  Administered 2018-04-12: 10 mg via INTRAVENOUS
  Filled 2018-04-11: qty 2

## 2018-04-11 MED ORDER — HYDROCODONE-ACETAMINOPHEN 5-325 MG PO TABS
1.0000 | ORAL_TABLET | ORAL | Status: DC | PRN
  Administered 2018-04-11 – 2018-04-12 (×4): 2 via ORAL
  Administered 2018-04-12: 1 via ORAL
  Filled 2018-04-11 (×5): qty 2

## 2018-04-11 MED ORDER — PROPOFOL 500 MG/50ML IV EMUL
INTRAVENOUS | Status: AC
  Filled 2018-04-11: qty 50

## 2018-04-11 MED ORDER — CEFAZOLIN SODIUM-DEXTROSE 1-4 GM/50ML-% IV SOLN
1.0000 g | Freq: Three times a day (TID) | INTRAVENOUS | Status: AC
  Administered 2018-04-11 – 2018-04-12 (×3): 1 g via INTRAVENOUS
  Filled 2018-04-11 (×3): qty 50

## 2018-04-11 MED ORDER — ONDANSETRON HCL 4 MG PO TABS: 4.0000 mg | ORAL_TABLET | Freq: Four times a day (QID) | ORAL | Status: DC | PRN

## 2018-04-11 MED ORDER — ONDANSETRON HCL 4 MG/2ML IJ SOLN: 4.0000 mg | Freq: Once | INTRAMUSCULAR | Status: DC | PRN

## 2018-04-11 MED ORDER — ACETAMINOPHEN 10 MG/ML IV SOLN
INTRAVENOUS | Status: AC
  Filled 2018-04-11: qty 100

## 2018-04-11 MED ORDER — SODIUM CHLORIDE 0.9 % IV SOLN
INTRAVENOUS | Status: DC
  Administered 2018-04-11 – 2018-04-12 (×2): via INTRAVENOUS

## 2018-04-11 MED ORDER — NEOMYCIN-POLYMYXIN B GU 40-200000 IR SOLN
Status: DC | PRN
  Administered 2018-04-11: 2 mL

## 2018-04-11 MED ORDER — FENTANYL CITRATE (PF) 100 MCG/2ML IJ SOLN
25.0000 ug | INTRAMUSCULAR | Status: DC | PRN
  Administered 2018-04-11 (×4): 25 ug via INTRAVENOUS

## 2018-04-11 MED ORDER — CEFAZOLIN SODIUM-DEXTROSE 2-3 GM-%(50ML) IV SOLR
INTRAVENOUS | Status: DC | PRN
  Administered 2018-04-11: 2 g via INTRAVENOUS

## 2018-04-11 MED ORDER — DEXAMETHASONE SODIUM PHOSPHATE 10 MG/ML IJ SOLN
INTRAMUSCULAR | Status: DC | PRN
  Administered 2018-04-11: 5 mg via INTRAVENOUS

## 2018-04-11 SURGICAL SUPPLY — 49 items
BANDAGE ACE 4X5 VEL STRL LF (GAUZE/BANDAGES/DRESSINGS) ×6 IMPLANT
BIT DRILL CANN 2.7X625 NONSTRL (BIT) ×3 IMPLANT
CANISTER SUCT 1200ML W/VALVE (MISCELLANEOUS) ×3 IMPLANT
CHLORAPREP W/TINT 26ML (MISCELLANEOUS) ×3 IMPLANT
CUFF TOURN 24 STER (MISCELLANEOUS) IMPLANT
CUFF TOURN 30 STER DUAL PORT (MISCELLANEOUS) IMPLANT
DEVICE FIXATION SYNDESMOSIS (Bone Implant) ×3 IMPLANT
DRAPE FLUOR MINI C-ARM 54X84 (DRAPES) ×3 IMPLANT
DRAPE INCISE IOBAN 66X45 STRL (DRAPES) ×3 IMPLANT
DRAPE U-SHAPE 47X51 STRL (DRAPES) ×3 IMPLANT
DRSG EMULSION OIL 3X8 NADH (GAUZE/BANDAGES/DRESSINGS) ×3 IMPLANT
ELECT CAUTERY BLADE 6.4 (BLADE) ×3 IMPLANT
ELECT REM PT RETURN 9FT ADLT (ELECTROSURGICAL) ×3
ELECTRODE REM PT RTRN 9FT ADLT (ELECTROSURGICAL) ×1 IMPLANT
FIXATION ZIPTIGHT ANKLE SNDSMS (Ankle) IMPLANT
GAUZE PETRO XEROFOAM 1X8 (MISCELLANEOUS) ×3 IMPLANT
GAUZE SPONGE 4X4 12PLY STRL (GAUZE/BANDAGES/DRESSINGS) ×3 IMPLANT
GLOVE SURG SYN 9.0  PF PI (GLOVE) ×2
GLOVE SURG SYN 9.0 PF PI (GLOVE) ×1 IMPLANT
GOWN SRG 2XL LVL 4 RGLN SLV (GOWNS) ×1 IMPLANT
GOWN STRL NON-REIN 2XL LVL4 (GOWNS) ×3
GOWN STRL REUS W/ TWL LRG LVL3 (GOWN DISPOSABLE) ×1 IMPLANT
GOWN STRL REUS W/TWL LRG LVL3 (GOWN DISPOSABLE) ×3
GUIDEWIRE THREADED 150MM (WIRE) ×6 IMPLANT
HEMOVAC 400ML (MISCELLANEOUS)
KIT DRAIN HEMOVAC JP 7FR 400ML (MISCELLANEOUS) IMPLANT
KIT TURNOVER KIT A (KITS) ×3 IMPLANT
LABEL OR SOLS (LABEL) ×3 IMPLANT
NS IRRIG 1000ML POUR BTL (IV SOLUTION) ×3 IMPLANT
PACK EXTREMITY ARMC (MISCELLANEOUS) ×3 IMPLANT
PAD ABD DERMACEA PRESS 5X9 (GAUZE/BANDAGES/DRESSINGS) ×6 IMPLANT
PAD CAST CTTN 4X4 STRL (SOFTGOODS) ×2 IMPLANT
PAD PREP 24X41 OB/GYN DISP (PERSONAL CARE ITEMS) ×3 IMPLANT
PADDING CAST COTTON 4X4 STRL (SOFTGOODS) ×6
SCREW CANN L THRD/32 4.0 (Orthopedic Implant) ×3 IMPLANT
SCREW CANN L THRD/34 4.0 (Screw) ×3 IMPLANT
SPLINT CAST 1 STEP 5X30 WHT (MISCELLANEOUS) ×3 IMPLANT
SPONGE LAP 18X18 5 PK (GAUZE/BANDAGES/DRESSINGS) ×3 IMPLANT
STAPLER SKIN PROX 35W (STAPLE) ×3 IMPLANT
SUT ETHILON 3-0 FS-10 30 BLK (SUTURE) ×3
SUT MNCRL AB 4-0 PS2 18 (SUTURE) ×6 IMPLANT
SUT VIC AB 0 CT1 36 (SUTURE) ×3 IMPLANT
SUT VIC AB 2-0 SH 27 (SUTURE) ×6
SUT VIC AB 2-0 SH 27XBRD (SUTURE) ×2 IMPLANT
SUT VIC AB 3-0 SH 27 (SUTURE) ×3
SUT VIC AB 3-0 SH 27X BRD (SUTURE) ×1 IMPLANT
SUTURE EHLN 3-0 FS-10 30 BLK (SUTURE) ×1 IMPLANT
SYR 10ML LL (SYRINGE) ×3 IMPLANT
ZIPTIGHT ANKLE SYNODESMOSS FIX (Ankle) ×3 IMPLANT

## 2018-04-11 NOTE — Transfer of Care (Signed)
Immediate Anesthesia Transfer of Care Note  Patient: Teresa Shields  Procedure(s) Performed: OPEN REDUCTION INTERNAL FIXATION (ORIF) ANKLE FRACTURE (Right Ankle)  Patient Location: PACU  Anesthesia Type:General  Level of Consciousness: drowsy  Airway & Oxygen Therapy: Patient Spontanous Breathing  Post-op Assessment: Report given to RN  Post vital signs: stable  Last Vitals:  Vitals Value Taken Time  BP    Temp    Pulse 73 04/11/2018 11:12 AM  Resp 13 04/11/2018 11:12 AM  SpO2 100 % 04/11/2018 11:12 AM  Vitals shown include unvalidated device data.  Last Pain:  Vitals:   04/11/18 0855  PainSc: 0-No pain         Complications: No apparent anesthesia complications

## 2018-04-11 NOTE — Anesthesia Preprocedure Evaluation (Addendum)
Anesthesia Evaluation  Patient identified by MRN, date of birth, ID band Patient awake    Reviewed: Allergy & Precautions, H&P , NPO status , Patient's Chart, lab work & pertinent test results, reviewed documented beta blocker date and time   Airway Mallampati: II  TM Distance: >3 FB Neck ROM: full    Dental  (+) Teeth Intact   Pulmonary neg pulmonary ROS,    Pulmonary exam normal        Cardiovascular Exercise Tolerance: Good hypertension, negative cardio ROS Normal cardiovascular exam Rate:Normal     Neuro/Psych negative neurological ROS  negative psych ROS   GI/Hepatic negative GI ROS, Neg liver ROS,   Endo/Other  negative endocrine ROSdiabetes  Renal/GU negative Renal ROS  negative genitourinary   Musculoskeletal  (+) Arthritis ,   Abdominal   Peds  Hematology negative hematology ROS (+)   Anesthesia Other Findings   Reproductive/Obstetrics negative OB ROS                            Anesthesia Physical Anesthesia Plan  ASA: III  Anesthesia Plan: General LMA   Post-op Pain Management:    Induction:   PONV Risk Score and Plan:   Airway Management Planned:   Additional Equipment:   Intra-op Plan:   Post-operative Plan:   Informed Consent: I have reviewed the patients History and Physical, chart, labs and discussed the procedure including the risks, benefits and alternatives for the proposed anesthesia with the patient or authorized representative who has indicated his/her understanding and acceptance.     Plan Discussed with: CRNA  Anesthesia Plan Comments:        Anesthesia Quick Evaluation

## 2018-04-11 NOTE — Anesthesia Procedure Notes (Signed)
Procedure Name: LMA Insertion Date/Time: 04/11/2018 10:05 AM Performed by: Bari Mantis I, CRNA Pre-anesthesia Checklist: Patient identified, Patient being monitored, Timeout performed, Emergency Drugs available and Suction available Patient Re-evaluated:Patient Re-evaluated prior to induction Oxygen Delivery Method: Circle system utilized Preoxygenation: Pre-oxygenation with 100% oxygen Induction Type: IV induction Ventilation: Mask ventilation without difficulty LMA: LMA inserted LMA Size: 4.0 Tube type: Oral Number of attempts: 1 Placement Confirmation: positive ETCO2 and breath sounds checked- equal and bilateral Tube secured with: Tape Dental Injury: Teeth and Oropharynx as per pre-operative assessment

## 2018-04-11 NOTE — Clinical Social Work Placement (Signed)
   CLINICAL SOCIAL WORK PLACEMENT  NOTE  Date:  04/11/2018  Patient Details  Name: Teresa Shields MRN: [id number] Date of Birth: 10/26/1936  Clinical Social Work is seeking post-discharge placement for this patient at the Skilled  Nursing Facility level of care (*CSW will initial, date and re-position this form in  chart as items are completed):  Yes   Patient/family provided with [facility] Clinical Social Work Department's list of facilities offering this level of care within the geographic area requested by the patient (or if unable, by the patient's family).  Yes   Patient/family informed of their freedom to choose among providers that offer the needed level of care, that participate in Medicare, Medicaid or managed care program needed by the patient, have an available bed and are willing to accept the patient.  Yes   Patient/family informed of 's ownership interest in [facility] and [facility], as well as of the fact that they are under no obligation to receive care at these facilities.  PASRR submitted to EDS on 04/11/18     PASRR number received on 04/11/18     Existing PASRR number confirmed on       FL2 transmitted to all facilities in geographic area requested by pt/family on 04/11/18     FL2 transmitted to all facilities within larger geographic area on       Patient informed that his/her managed care company has contracts with or will negotiate with certain facilities, including the following:        Yes   Patient/family informed of bed offers received.  Patient chooses bed at [facility] )     Physician recommends and patient chooses bed at      Patient to be transferred to   on  .  Patient to be transferred to facility by       Patient family notified on   of transfer.  Name of family member notified:        PHYSICIAN       Additional Comment:    _______________________________________________ Weslee Fogg, Darleen Crocker,  LCSW 04/11/2018, 3:57 PM

## 2018-04-11 NOTE — [place] FL2 (Signed)
[place] MEDICAID FL2 LEVEL OF CARE SCREENING TOOL     IDENTIFICATION  Patient Name: Teresa Shields Birthdate: 1936-10-25 Sex: female Admission Date (Current Location): 04/11/2018  [place] and [place] Number:  Chiropodist and Address:  [facility], [address] [place], [place] [address]      Provider Number: [id number]  Attending Physician Name and Address:  Kennedy Bucker, MD  Relative Name and Phone Number:       Current Level of Care: Hospital Recommended Level of Care: Skilled Nursing Facility Prior Approval Number:    Date Approved/Denied:   PASRR Number: ([id number])  Discharge Plan: SNF    Current Diagnoses: Patient Active Problem List   Diagnosis Date Noted  . Bimalleolar ankle fracture 04/11/2018  .    03/08/2018  . Microalbuminuria 01/17/2018  . Annual physical exam 01/15/2018  . Arthralgia 07/04/2016  . Edema 07/04/2016  . Osteopenia 06/27/2015  . Type 2 diabetes mellitus with diabetic neuropathy, without long-term current use of insulin (HCC) 06/18/2015  . Hypokalemia 06/18/2015  . Lumbago 06/18/2015  . Neuralgia 06/18/2015  . Allergic rhinitis 03/18/2010  . Hypercalcemia 09/22/2009  . Sacroiliac inflammation (HCC) 05/12/2008  . Gout 01/15/2008  . Hyperlipidemia, mixed 01/15/2008  . Hypertension, essential, benign 01/15/2008  . Malignant neoplasm of skin of leg 01/15/2008    Orientation RESPIRATION BLADDER Height & Weight     Self, Time, Situation, Place  O2(2 Liters Oxygen. ) Continent Weight: 170 lb (77.1 kg) Height:   (167.6 cm)  BEHAVIORAL SYMPTOMS/MOOD NEUROLOGICAL BOWEL NUTRITION STATUS      Continent Diet(Diet: Regular )  AMBULATORY STATUS COMMUNICATION OF NEEDS Skin   Extensive Assist Verbally Surgical wounds(Ankle Fracture )                       Personal Care Assistance Level of Assistance  Bathing, Feeding, Dressing Bathing Assistance: Limited assistance Feeding assistance:  Independent Dressing Assistance: Limited assistance     Functional Limitations Info  Sight, Hearing, Speech Sight Info: Adequate Hearing Info: Adequate Speech Info: Adequate    SPECIAL CARE FACTORS FREQUENCY  PT (By licensed PT), OT (By licensed OT)     PT Frequency: (5) OT Frequency: (5)            Contractures      Additional Factors Info  Code Status, Allergies Code Status Info: (Full Code. ) Allergies Info: (Naproxen)           Current Medications (04/11/2018):  This is the current hospital active medication list Current Facility-Administered Medications  Medication Dose Route Frequency Provider Last Rate Last Dose  . 0.9 %  sodium chloride infusion   Intravenous Continuous Kennedy Bucker, MD 100 mL/hr at 04/11/18 1300    . ceFAZolin (ANCEF) IVPB 1 g/50 mL premix  1 g Intravenous Q8H Kennedy Bucker, MD      . HYDROcodone-acetaminophen (NORCO/VICODIN) 5-325 MG per tablet 1-2 tablet  1-2 tablet Oral Q4H PRN Kennedy Bucker, MD   2 tablet at 04/11/18 1425  . metoCLOPramide (REGLAN) tablet 5-10 mg  5-10 mg Oral Q8H PRN Kennedy Bucker, MD       Or  . metoCLOPramide (REGLAN) injection 5-10 mg  5-10 mg Intravenous Q8H PRN Kennedy Bucker, MD      . ondansetron [facility]) tablet 4 mg  4 mg Oral Q6H PRN Kennedy Bucker, MD       Or  . ondansetron [facility]) injection 4 mg  4 mg Intravenous Q6H PRN  Kennedy Bucker, MD         Discharge Medications: Please see discharge summary for a list of discharge medications.  Relevant Imaging Results:  Relevant Lab Results:   Additional Information (SSN: [id number])  Yan Pankratz, Darleen Crocker, LCSW

## 2018-04-11 NOTE — Clinical Social Work Note (Signed)
Clinical Social Work Assessment  Patient Details  Name: Teresa Shields MRN: [id number] Date of Birth: 1936-03-09  Date of referral:  04/11/18               Reason for consult:  Facility Placement                Permission sought to share information with:  Chartered certified accountant granted to share information::  Yes, Verbal Permission Granted  Name::      [place]::   [place]   Relationship::     Contact Information:     Housing/Transportation Living arrangements for the past 2 months:  [place] of Information:  Patient, Adult Children, Other (Comment Required)(Granddaughter ) Patient Interpreter Needed:  None Criminal Activity/Legal Involvement Pertinent to Current Situation/Hospitalization:  No - Comment as needed Significant Relationships:  Adult Children, Other Family Members Lives with:  Self Do you feel safe going back to the place where you live?  No Need for family participation in patient care:  Yes (Comment)  Care giving concerns:  Patient lives in [place] alone.    Social Worker assessment / plan:  Holiday representative (CSW) received SNF consult. PT is pending. Patient had surgery today for an ankle fracture. Per Dr. Rudene Christians patient has been having a really hard time at home and has not been able to walk. Dr. Rudene Christians requested that patient go to a skilled nursing facility. CSW met with patient and her daughter Teresa Shields and granddaughter were at bedside. Patient was very pleasant and was alert and oriented X4. Patient was laying in the bed. CSW introduced self and explained role of CSW department. Patient reported that she lives alone in [facility] and has not been able to walk since the ankle fracture. Per patient her daughter Teresa Shields has been staying with her temporarily and took off work however Teresa Shields has to go back to work. Per patient all her friends and family work and can't take care of her at home. Patient  reported that she can't pay out of pocket to go to a skilled nursing facility. Patient reported that she wants to go to a SNF in [place]. CSW explained that [facility] will have to approve SNF and that patient is admitted under "outpatient bed" status, which means she will likely D/C tomorrow. Patient verbalized her understanding. FL2 complete and faxed out.  CSW discussed case with CSW Engineer, site who approved a 5 day LOG. Per [facility] admissions coordinator at [facility] they will accept patient tomorrow on a 5 day LOG pending [employer] authorization.   CSW met with patient for a second time and her daughter Teresa Shields and granddaughter were still at bedside. CSW explained that [facility] is willing to accept patient with [place] authorization pending. CSW explained that if [facility] denies SNF then patient will have to D/C home from [facility] is 5 days. Patient, daughter and granddaughter accepted bed offer from [facility] and verbalized their understanding.   Per [facility] admissions coordinator at [facility] she will start [facility] authorization today. CSW will continue to follow and assist as needed.    Employment status:  Retired Nurse, adult PT Recommendations:  Not assessed at this time [place] / Referral to community resources:  [place]  Patient/Family's Response to care:  Patient accepted bed offer from [facility].   Patient/Family's Understanding of and Emotional Response to Diagnosis, Current Treatment, and Prognosis:  Patient was very pleasant and thanked CSW for assistance.   Emotional Assessment Appearance:  Appears stated age Attitude/Demeanor/Rapport:    Affect (typically observed):  Accepting, Adaptable, Pleasant Orientation:  Oriented to Self, Oriented to Place, Oriented to  Time, Oriented to Situation Alcohol / Substance use:  Not Applicable Psych involvement (Current and /or  in the community):  No (Comment)  Discharge Needs  Concerns to be addressed:  Discharge Planning Concerns Readmission within the last 30 days:  No Current discharge risk:  Dependent with Mobility Barriers to Discharge:  Continued Medical Work up   [employer], Teresa Beets, LCSW 04/11/2018, 3:58 PM

## 2018-04-11 NOTE — Op Note (Signed)
04/11/2018  11:18 AM  PATIENT:  Teresa Shields  82 y.o. female  PRE-OPERATIVE DIAGNOSIS:  CLOSED BIMALLEOLAR FRACTURE OF RIGHT ANKLE  POST-OPERATIVE DIAGNOSIS:  CLOSED BIMALLEOLAR FRACTURE OF RIGHT ANKLE  PROCEDURE:  Procedure(s): OPEN REDUCTION INTERNAL FIXATION (ORIF) ANKLE FRACTURE (Right)  SURGEON: Leitha Schuller, MD  ASSISTANTS: none  ANESTHESIA:   general  EBL:  Total I/O In: 400 [I.V.:400] Out: 5 [Blood:5]  BLOOD ADMINISTERED:none  DRAINS: none   LOCAL MEDICATIONS USED:  none  SPECIMEN:  No Specimen  DISPOSITION OF SPECIMEN:  N/A  COUNTS:  YES  TOURNIQUET:  * Missing tourniquet times found for documented tourniquets in log: [id number] *34 minutes at 300 mmHg  IMPLANTS: Synthes 4.0 cannulated screw x2, Biomet zip tight x2  DICTATION: .Dragon Dictation patient was brought to the operating room and after adequate anesthesia was obtained the right leg was prepped and draped in the usual sterile fashion with a bump underneath the right buttock to internally rotate the leg.  After appropriate patient identification and timeout procedure were completed tourniquet was raised.  The medial malleolus was approached first with a anterior medial approach at the medial malleolus exposed and the fracture reduced and 2 K wires inserted parallel with the fracture being oblique the screws were placed perpendicular to the fracture line and over drilling was carried out on the malleolar fragment the screws were then placed and tightened and there appeared to be rigid fixation.  On the AP view at this point the fracture appeared well reduced and with eversion inversion of the ankle the syndesmosis also reduced.  A incision was made laterally and a K wire was sent across the distal tibia drilling carried out and then his appetite placed and tightened with the first placed a second wire was placed distal to the first just above the joint line at the level of the screws and the drilling was carried  out the implant and the zip tight pass-through and tightened viscus appeared to get very nice fixation to the syndesmosis with anatomic alignment in both AP and lateral projections without posterior subluxation of the tibia the wounds were then irrigated and closed with 2-0 Vicryl subcutaneously and skin staples followed by Xeroform 4 x 4's web roll stirrup splint and Ace wrap with tourniquet then let down  PLAN OF CARE: Admit for overnight observation  PATIENT DISPOSITION:  PACU - hemodynamically stable.

## 2018-04-11 NOTE — H&P (Signed)
Reviewed paper H+P, will be scanned into chart. No changes noted.  

## 2018-04-11 NOTE — Anesthesia Post-op Follow-up Note (Signed)
Anesthesia QCDR form completed.        

## 2018-04-12 DIAGNOSIS — E782 Mixed hyperlipidemia: Secondary | ICD-10-CM | POA: Diagnosis not present

## 2018-04-12 DIAGNOSIS — Z7982 Long term (current) use of aspirin: Secondary | ICD-10-CM | POA: Diagnosis not present

## 2018-04-12 DIAGNOSIS — I10 Essential (primary) hypertension: Secondary | ICD-10-CM | POA: Diagnosis not present

## 2018-04-12 DIAGNOSIS — Z79899 Other long term (current) drug therapy: Secondary | ICD-10-CM | POA: Diagnosis not present

## 2018-04-12 DIAGNOSIS — S82841A Displaced bimalleolar fracture of right lower leg, initial encounter for closed fracture: Secondary | ICD-10-CM | POA: Diagnosis not present

## 2018-04-12 DIAGNOSIS — S93431A Sprain of tibiofibular ligament of right ankle, initial encounter: Secondary | ICD-10-CM | POA: Diagnosis not present

## 2018-04-12 DIAGNOSIS — E1140 Type 2 diabetes mellitus with diabetic neuropathy, unspecified: Secondary | ICD-10-CM | POA: Diagnosis not present

## 2018-04-12 MED ORDER — ONDANSETRON HCL 4 MG/2ML IJ SOLN
4.0000 mg | Freq: Four times a day (QID) | INTRAMUSCULAR | Status: DC | PRN
  Administered 2018-04-13: 4 mg via INTRAVENOUS
  Filled 2018-04-12: qty 2

## 2018-04-12 MED ORDER — ONDANSETRON HCL 4 MG PO TABS: 4.0000 mg | ORAL_TABLET | Freq: Four times a day (QID) | ORAL | Status: DC | PRN

## 2018-04-12 MED ORDER — HYDROCODONE-ACETAMINOPHEN 5-325 MG PO TABS: 1.0000 | ORAL_TABLET | ORAL | 0 refills | Status: DC | PRN

## 2018-04-12 NOTE — Progress Notes (Signed)
Patient noticed to be flushed to her face. Pt states that her hands feel swollen. PA Todd paged. Awaiting on call back.

## 2018-04-12 NOTE — Discharge Instructions (Signed)
INSTRUCTIONS AFTER Surgery  o Remove items at home which could result in a fall. This includes throw rugs or furniture in walking pathways o ICE to the affected joint every three hours while awake for 30 minutes at a time, for at least the first 3-5 days, and then as needed for pain and swelling.  Continue to use ice for pain and swelling. You may notice swelling that will progress down to the foot and ankle.  This is normal after surgery.  Elevate your leg when you are not up walking on it.   o Continue to use the breathing machine you got in the hospital (incentive spirometer) which will help keep your temperature down.  It is common for your temperature to cycle up and down following surgery, especially at night when you are not up moving around and exerting yourself.  The breathing machine keeps your lungs expanded and your temperature down.   DIET:  As you were doing prior to hospitalization, we recommend a well-balanced diet.  DRESSING / WOUND CARE / SHOWERING  The splint will remain intact until changed at the orthopedic department.  Change the Ace wrap if there is any bleeding.  No showering or getting wet.  ACTIVITY  o Increase activity slowly as tolerated, but follow the weight bearing instructions below.   o No driving for 6 weeks or until further direction given by your physician.  You cannot drive while taking narcotics.  o No lifting or carrying greater than 10 lbs. until further directed by your surgeon. o Avoid periods of inactivity such as sitting longer than an hour when not asleep. This helps prevent blood clots.  o You may return to work once you are authorized by your doctor.     WEIGHT BEARING  Nonweightbearing on the right leg for 6 weeks.   EXERCISES Ambulation and balance training with physical therapy.  Nonweightbearing on the right leg.  CONSTIPATION  Constipation is defined medically as fewer than three stools per week and severe constipation as less than  one stool per week.  Even if you have a regular bowel pattern at home, your normal regimen is likely to be disrupted due to multiple reasons following surgery.  Combination of anesthesia, postoperative narcotics, change in appetite and fluid intake all can affect your bowels.   YOU MUST use at least one of the following options; they are listed in order of increasing strength to get the job done.  They are all available over the counter, and you may need to use some, POSSIBLY even all of these options:    Drink plenty of fluids (prune juice may be helpful) and high fiber foods Colace 100 mg by mouth twice a day  Senokot for constipation as directed and as needed Dulcolax (bisacodyl), take with full glass of water  Miralax (polyethylene glycol) once or twice a day as needed.  If you have tried all these things and are unable to have a bowel movement in the first 3-4 days after surgery call either your surgeon or your primary doctor.    If you experience loose stools or diarrhea, hold the medications until you stool forms back up.  If your symptoms do not get better within 1 week or if they get worse, check with your doctor.  If you experience "the worst abdominal pain ever" or develop nausea or vomiting, please contact the office immediately for further recommendations for treatment.   ITCHING:  If you experience itching with your medications, try  taking only a single pain pill, or even half a pain pill at a time.  You can also use Benadryl over the counter for itching or also to help with sleep.   TED HOSE STOCKINGS:  Use stockings on both legs until for at least 2 weeks or as directed by physician office. They may be removed at night for sleeping.  MEDICATIONS:  See your medication summary on the After Visit Summary that nursing will review with you.  You may have some home medications which will be placed on hold until you complete the course of blood thinner medication.  It is important for  you to complete the blood thinner medication as prescribed.  PRECAUTIONS:  If you experience chest pain or shortness of breath - call 911 immediately for transfer to the hospital emergency department.   If you develop a fever greater that 101 F, purulent drainage from wound, increased redness or drainage from wound, foul odor from the wound/dressing, or calf pain - CONTACT YOUR SURGEON.                                                   FOLLOW-UP APPOINTMENTS:  If you do not already have a post-op appointment, please call the office for an appointment to be seen by your surgeon.  Guidelines for how soon to be seen are listed in your After Visit Summary, but are typically between 1-4 weeks after surgery.  OTHER INSTRUCTIONS:     MAKE SURE YOU:   Understand these instructions.   Get help right away if you are not doing well or get worse.    Thank you for letting us be a part of your medical care team.  It is a privilege we respect greatly.  We hope these instructions will help you stay on track for a fast and full recovery!

## 2018-04-12 NOTE — Progress Notes (Signed)
Patient had two episodes of nausea+ vomiting x 2. PRN Zofran and Reglan IV given later on. Pt verbalizes a decrease in nausea but does not feel completely well. Patient not able to eat a meal. PA Todd paged and per PA ok to keep patient overnight. Also, received verbal orders to increase Zofran 4-8mg  6hrs PRN.

## 2018-04-12 NOTE — Progress Notes (Signed)
Patient is medically stable for D/C to [facility] after PT today. Per [facility] admissions coordinator at [facility] patient can come today under a 5 day LOG because [facility] SNF authorization is pending. Per Tresa Endo she started [facility] authorization yesterday. RN will call report and arrange EMS for transport after PT. Clinical Child psychotherapist (CSW) sent D/C orders to [facility] via [place]. Patient is aware of above. Patient's daughter Marylouise Stacks is aware of above. Patient and her daughter understand that if [employer] denies SNF then patient will have to discharge from [facility] after 5 days. CSW Chiropodist approved 5 day LOG. Please reconsult if future social work needs arise. CSW signing off.   [employer], LCSW [phone]

## 2018-04-12 NOTE — Progress Notes (Signed)
Per nurse discharge has been cancelled for today. Plan is for patient to D/C to [facility]. [facility] admissions coordinator at [facility] is aware of above.   [employer], LCSW [phone]

## 2018-04-12 NOTE — Care Management (Signed)
RNCM spoke with patient regarding discharge planning. She plans to go to [facility] at discharge for a little while.  She thinks she has a 4-wheeled walker at home. She states she has a wheelchair also.  She lives alone. Her son and daughter live close by but they both work and unable to stay with her. She is at baseline independent and able to drive.  PT evaluation pending.

## 2018-04-12 NOTE — Evaluation (Signed)
Physical Therapy Evaluation Patient Details Name: Teresa Shields MRN: [id number] DOB: 15-Feb-1936 Today's Date: 04/12/2018   History of Present Illness  82 y/o female here 4/23 after hyperplantar flexion injury of R ankle.  Ultimately had ORIF repair 5/2.  Pt also had R carpal tunnel repair ~3 weeks ago.  Clinical Impression  Pt did well with PT exam considering the additional limitations from recent R carpal tunnel sx.  She showed great effort with trying to standing and "walk" but struggled to just go bed to recliner with +2 assist.  She had been having some severe nausea (and vomiting) earlier that was not as much of a limiter during exam but still effected her tolerance to activity.  Pt is normally independent, drives, runs errands, etc but is completely unsafe at this time to to go home and manage, given her mobility status she would struggle at home even with assist.  Pt showed very good effort with additional mobility training apart from exam but generally remains very functionally limited.     Follow Up Recommendations SNF    Equipment Recommendations  Rolling walker with 5" wheels(with R platform)    Recommendations for Other Services       Precautions / Restrictions Precautions Precautions: Fall Required Braces or Orthoses: Other Brace/Splint Other Brace/Splint: R ankle in splint Restrictions Weight Bearing Restrictions: Yes RLE Weight Bearing: Non weight bearing Other Position/Activity Restrictions: 3 weeks s/p carpal tunnel sx (generally no weighted gripping, pulling, etc)      Mobility  Bed Mobility Overal bed mobility: Needs Assistance Bed Mobility: Supine to Sit     Supine to sit: Min assist     General bed mobility comments: Pt able to lift R LE and move it toward EOB, needed light assist to get to sitting but actually did relatively well with mobility  Transfers Overall transfer level: Needs assistance Equipment used: Right platform walker;Rolling walker (2  wheeled) Transfers: Sit to/from Stand Sit to Stand: Min assist;Mod assist;+2 physical assistance         General transfer comment: Pt unable to use R U&LEs to assist with getting to standing.  Pt did surprisingly well shifting weight to the L, keeping weight off the R and not needing excessive assist to rise.  Pt did struggle with  maintaining balance to get L UE to the walker  Ambulation/Gait Ambulation/Gait assistance: Mod assist Ambulation Distance (Feet): 3 Feet Assistive device: Rolling walker (2 wheeled);Right platform walker       General Gait Details: Pt was able to maintain NWBing on the R but struggled with standing tolerance.  She was able to heel-toe a little and at one point was able to clear foot to hop, but ultimately was highly reliant on R platform and L UE and needed to sit quickly once getting square to the recliner  Stairs            Wheelchair Mobility    Modified Rankin (Stroke Patients Only)       Balance Overall balance assessment: Needs assistance Sitting-balance support: Single extremity supported Sitting balance-Leahy Scale: Good Sitting balance - Comments: Pt is able to maintain balance w/o issue     Standing balance-Leahy Scale: Poor Standing balance comment: Pt highly reliant on the walker to maintain standing, +2 assist to keep from leaning too far                             Pertinent Vitals/Pain Pain  Assessment: 0-10 Pain Score: 4  Pain Location: R ankle    Home Living Family/patient expects to be discharged to:: Private residence Living Arrangements: Alone Available Help at Discharge: Family;Available PRN/intermittently(family will try to be as available as they can once home)   Home Access: Ramped entrance     Home Layout: One level Home Equipment: Walker - 4 wheels;Wheelchair - manual(has knee scooter, can't use secondary to R carpal tunnel sx)      Prior Function Level of Independence: Independent          Comments: Pt was able to drive, run errands, manage home, etc w/o AD      Hand Dominance        Extremity/Trunk Assessment   Upper Extremity Assessment Upper Extremity Assessment: (R wrist/grip/etc not tested, otherwise grossly functional)    Lower Extremity Assessment Lower Extremity Assessment: Generalized weakness(R ankle NT, AROM with 3+/5 strength t/o)       Communication   Communication: No difficulties  Cognition Arousal/Alertness: Awake/alert Behavior During Therapy: WFL for tasks assessed/performed Overall Cognitive Status: Within Functional Limits for tasks assessed                                        General Comments      Exercises     Assessment/Plan    PT Assessment Patient needs continued PT services  PT Problem List Decreased strength;Decreased activity tolerance;Decreased mobility;Decreased safety awareness;Decreased balance;Decreased coordination;Decreased knowledge of use of DME;Pain       PT Treatment Interventions Therapeutic exercise;DME instruction;Gait training;Functional mobility training;Therapeutic activities;Balance training;Neuromuscular re-education;Cognitive remediation;Patient/family education    PT Goals (Current goals can be found in the Care Plan section)  Acute Rehab PT Goals Patient Stated Goal: get to rehab to regain some independence PT Goal Formulation: With patient Time For Goal Achievement: 04/26/18 Potential to Achieve Goals: Good    Frequency BID   Barriers to discharge        Co-evaluation               AM-PAC PT "6 Clicks" Daily Activity  Outcome Measure Difficulty turning over in bed (including adjusting bedclothes, sheets and blankets)?: A Little Difficulty moving from lying on back to sitting on the side of the bed? : Unable Difficulty sitting down on and standing up from a chair with arms (e.g., wheelchair, bedside commode, etc,.)?: Unable Help needed moving to and from a bed to  chair (including a wheelchair)?: A Lot Help needed walking in hospital room?: Total Help needed climbing 3-5 steps with a railing? : Total 6 Click Score: 9    End of Session Equipment Utilized During Treatment: Gait belt Activity Tolerance: Patient tolerated treatment well(mild nausea t/o the session (had been vomiting this AM)) Patient left: with chair alarm set;with call bell/phone within reach;with family/visitor present   PT Visit Diagnosis: Muscle weakness (generalized) (M62.81);Difficulty in walking, not elsewhere classified (R26.2)    Time: 4098-1191 PT Time Calculation (min) (ACUTE ONLY): 40 min   Charges:   PT Evaluation $PT Eval Low Complexity: 1 Low PT Treatments $Therapeutic Activity: 8-22 mins   PT G Codes:        Malachi Pro, DPT 04/12/2018, 2:17 PM

## 2018-04-12 NOTE — Anesthesia Postprocedure Evaluation (Signed)
Anesthesia Post Note  Patient: Analayah M Vega  Procedure(s) Performed: OPEN REDUCTION INTERNAL FIXATION (ORIF) ANKLE FRACTURE (Right Ankle)  Patient location during evaluation: PACU Anesthesia Type: General Level of consciousness: awake and alert Pain management: pain level controlled Vital Signs Assessment: post-procedure vital signs reviewed and stable Respiratory status: spontaneous breathing, nonlabored ventilation, respiratory function stable and patient connected to nasal cannula oxygen Cardiovascular status: blood pressure returned to baseline and stable Postop Assessment: no apparent nausea or vomiting Anesthetic complications: no     Last Vitals:  Vitals:   04/12/18 0422 04/12/18 0757  BP: (!) 131/54 (!) 143/52  Pulse: 73 85  Resp: 18 18  Temp: 36.6 C   SpO2: 100% 100%    Last Pain:  Vitals:   04/12/18 1008  TempSrc:   PainSc: 5                  Yevette Edwards

## 2018-04-12 NOTE — Discharge Summary (Addendum)
Physician Discharge Summary  Subjective: 2 Days Post-Op Procedure(s) (LRB): OPEN REDUCTION INTERNAL FIXATION (ORIF) ANKLE FRACTURE (Right) Patient reports pain as mild.   Patient seen in rounds with Dr. Rosita Kea. Patient is well, and has had no acute complaints or problems Patient is ready to go to rehab for physical therapy  Physician Discharge Summary  Patient ID: Teresa Shields MRN: [id number] DOB/AGE: May 24, 1936 82 y.o.  Admit date: 04/11/2018 Discharge date: 04/13/2018  Admission Diagnoses:  Discharge Diagnoses:  Active Problems:   Bimalleolar ankle fracture   Discharged Condition: fair  Hospital Course: The patient is postop day 1 from a right ankle bimalleolar ORIF done by Dr. Rosita Kea.  The patient is doing better this morning.  She still has some pain but is becoming more controlled.  She has not gotten up yet with physical therapy.  Her pain management is improving.  The patient did ambulate several feet with physical therapy.  The patient was having some nausea and flushing in her face.  Her discharge was held yesterday and she will go to rehab today on postop day 2.  Patient is ready to go to rehab today for physical therapy before going home.  Treatments: surgery:  OPEN REDUCTION INTERNAL FIXATION (ORIF) ANKLE FRACTURE (Right)  SURGEON: Leitha Schuller, MD  ASSISTANTS: none  ANESTHESIA:   general  EBL:  Total I/O In: 400 [I.V.:400] Out: 5 [Blood:5]  BLOOD ADMINISTERED:none  DRAINS: none   LOCAL MEDICATIONS USED:  none  SPECIMEN:  No Specimen  DISPOSITION OF SPECIMEN:  N/A  COUNTS:  YES  TOURNIQUET:  * Missing tourniquet times found for documented tourniquets in log: [id number] *34 minutes at 300 mmHg  IMPLANTS: Synthes 4.0 cannulated screw x2, Biomet zip tight x2    Discharge Exam: Blood pressure (!) 164/64, pulse 95, temperature 98.4 F (36.9 C), temperature source Oral, resp. rate 18, height  (1.676 m), weight 77.1 kg (170 lb), SpO2 99  %.   Disposition: Discharge disposition: 03-Skilled Nursing Facility        Allergies as of 04/13/2018      Reactions   Naproxen Diarrhea      Medication List    TAKE these medications   acetaminophen 500 MG tablet Commonly known as:  TYLENOL Take 1,000 mg by mouth 2 (two) times daily as needed (for pain.).   allopurinol 300 MG tablet Commonly known as:  ZYLOPRIM TAKE 1 TABLET EVERY DAY   aspirin EC 81 MG tablet Take 81 mg by mouth daily.   atorvastatin 40 MG tablet Commonly known as:  LIPITOR TAKE 1 TABLET AT BEDTIME FOR CHOLESTEROL   colchicine 0.6 MG tablet 2 tablets at first sign of gout, then one daily as needed What changed:    how much to take  how to take this  when to take this  additional instructions   DULoxetine 30 MG capsule Commonly known as:  CYMBALTA Take 30 mg by mouth at bedtime.   gabapentin 100 MG capsule Commonly known as:  NEURONTIN TAKE 1 CAPSULE TWICE DAILY   HYDROcodone-acetaminophen 5-325 MG tablet Commonly known as:  NORCO/VICODIN Take 1-2 tablets by mouth every 4 (four) hours as needed for moderate pain. What changed:    how much to take  reasons to take this   indomethacin 25 MG capsule Commonly known as:  INDOCIN Take 1-2 capsules (25-50 mg total) by mouth 2 (two) times daily with a meal. For gout What changed:    when to take this  reasons to  take this  additional instructions   metoprolol succinate 50 MG 24 hr tablet Commonly known as:  TOPROL-XL TAKE 1 TABLET EVERY DAY FOR BLOOD PRESSURE What changed:  See the new instructions.   multivitamin with minerals Tabs tablet Take 1 tablet by mouth daily.   ondansetron 4 MG tablet Commonly known as:  ZOFRAN Take 1-2 tablets (4-8 mg total) by mouth every 6 (six) hours as needed for nausea.   OSCAL 500/200 D-3 500-200 MG-UNIT tablet Generic drug:  calcium-vitamin D Take 1 tablet by mouth daily with breakfast.   triamterene-hydrochlorothiazide 75-50 MG  tablet Commonly known as:  MAXZIDE TAKE 1 TABLET EVERY DAY   vitamin C 1000 MG tablet Take 1,000 mg by mouth daily.   Vitamin E 400 units Tabs Take 400 Units by mouth daily.       Contact information for follow-up providers    Tera Partridge, PA On 04/15/2018.   Specialty:  Physician Assistant Why:  @ 3:45 pm Contact information: [address] [facility] [place] [address] [phone]            Contact information for after-discharge care    Destination    [facility] SNF .   Service:  Skilled Nursing Contact information: [address] [place] [address] [phone]                  Signed: Lenard Forth, Gurvir Schrom 04/13/2018, 6:27 AM   Objective: Vital signs in last 24 hours: Temp:  [97.7 F (36.5 C)-98.5 F (36.9 C)] 98.4 F (36.9 C) (05/04 0035) Pulse Rate:  [83-97] 95 (05/04 0035) Resp:  [18-20] 18 (05/04 0035) BP: (143-174)/(52-80) 164/64 (05/04 0035) SpO2:  [96 %-100 %] 99 % (05/04 0035)  Intake/Output from previous day:  Intake/Output Summary (Last 24 hours) at 04/13/2018 0627 Last data filed at 04/12/2018 1700 Gross per 24 hour  Intake 240 ml  Output -  Net 240 ml    Intake/Output this shift: No intake/output data recorded.  Labs: Recent Labs    04/11/18 0906  HGB 12.9   Recent Labs    04/11/18 0906  HCT 38.0   Recent Labs    04/11/18 0906  NA 141  K 3.3*  GLUCOSE 146*   No results for input(s): LABPT, INR in the last 72 hours.  EXAM: General - Patient is Alert and Oriented Extremity - Neurologically intact Dorsiflexion/Plantar flexion intact Incision - clean, dry, no drainage Motor Function -dorsiflexion and plantarflexion of her toes  Assessment/Plan: 2 Days Post-Op Procedure(s) (LRB): OPEN REDUCTION INTERNAL FIXATION (ORIF) ANKLE FRACTURE (Right) Procedure(s) (LRB): OPEN REDUCTION INTERNAL FIXATION (ORIF) ANKLE FRACTURE (Right) Past Medical History:  Diagnosis Date   . Diabetes mellitus without complication (HCC)   . Gout   . History of chicken pox   . Hyperlipidemia   . Hypertension    Active Problems:   Bimalleolar ankle fracture  Estimated body mass index is 27.44 kg/m as calculated from the following:   Height as of this encounter:  (1.676 m).   Weight as of this encounter: 77.1 kg (170 lb). Advance diet Up with therapy D/C IV fluids Discharge to SNF Diet - Regular diet Follow up - in 2 weeks Activity - NWB for 6 weeks Disposition - Rehab Condition Upon Discharge - Fair DVT Prophylaxis - None  Dedra Skeens, PA-C Orthopaedic Surgery 04/13/2018, 6:27 AM

## 2018-04-12 NOTE — Clinical Social Work Placement (Signed)
   CLINICAL SOCIAL WORK PLACEMENT  NOTE  Date:  04/12/2018  Patient Details  Name: Teresa Shields MRN: [id number] Date of Birth: 09/30/1936  Clinical Social Work is seeking post-discharge placement for this patient at the Skilled  Nursing Facility level of care (*CSW will initial, date and re-position this form in  chart as items are completed):  Yes   Patient/family provided with [place] Clinical Social Work Department's list of facilities offering this level of care within the geographic area requested by the patient (or if unable, by the patient's family).  Yes   Patient/family informed of their freedom to choose among providers that offer the needed level of care, that participate in Medicare, Medicaid or managed care program needed by the patient, have an available bed and are willing to accept the patient.  Yes   Patient/family informed of [facility]'s ownership interest in [facility] and [facility], as well as of the fact that they are under no obligation to receive care at these facilities.  PASRR submitted to EDS on 04/11/18     PASRR number received on 04/11/18     Existing PASRR number confirmed on       FL2 transmitted to all facilities in geographic area requested by pt/family on 04/11/18     FL2 transmitted to all facilities within larger geographic area on       Patient informed that his/her managed care company has contracts with or will negotiate with certain facilities, including the following:        Yes   Patient/family informed of bed offers received.  Patient chooses bed at [facility] )     Physician recommends and patient chooses bed at      Patient to be transferred to [facility] ) on 04/12/18.  Patient to be transferred to facility by [facility] )     Patient family notified on 04/12/18 of transfer.  Name of family member notified:  (Patient's daughter Marylouise Stacks is aware of D/C today. )     PHYSICIAN        Additional Comment:    _______________________________________________ Sherline Eberwein, Darleen Crocker, LCSW 04/12/2018, 9:15 AM

## 2018-04-12 NOTE — Progress Notes (Signed)
  Subjective: 1 Day Post-Op Procedure(s) (LRB): OPEN REDUCTION INTERNAL FIXATION (ORIF) ANKLE FRACTURE (Right) Patient reports pain as mild.   Patient seen in rounds with Dr. Rosita Kea. Patient is well, and has had no acute complaints or problems Plan is to go Rehab after hospital stay. Negative for chest pain and shortness of breath Fever: no Gastrointestinal: Negative for nausea and vomiting  Objective: Vital signs in last 24 hours: Temp:  [97 F (36.1 C)-98.5 F (36.9 C)] 97.9 F (36.6 C) (05/03 0422) Pulse Rate:  [64-79] 73 (05/03 0422) Resp:  [5-19] 18 (05/03 0422) BP: (121-159)/(49-72) 131/54 (05/03 0422) SpO2:  [94 %-100 %] 100 % (05/03 0422) Weight:  [77.1 kg (170 lb)] 77.1 kg (170 lb) (05/02 0855)  Intake/Output from previous day:  Intake/Output Summary (Last 24 hours) at 04/12/2018 0614 Last data filed at 04/11/2018 2250 Gross per 24 hour  Intake 1570 ml  Output 205 ml  Net 1365 ml    Intake/Output this shift: Total I/O In: -  Out: 200 [Urine:200]  Labs: Recent Labs    04/11/18 0906  HGB 12.9   Recent Labs    04/11/18 0906  HCT 38.0   Recent Labs    04/09/18 1440 04/11/18 0906  NA  --  141  K 3.0* 3.3*  GLUCOSE  --  146*   No results for input(s): LABPT, INR in the last 72 hours.   EXAM General - Patient is Alert and Oriented Extremity - Sensation intact distally Dorsiflexion/Plantar flexion intact Dressing/Incision - clean, dry, no drainage Motor Function - intact, moving foot and toes well on exam.   Past Medical History:  Diagnosis Date  . Diabetes mellitus without complication (HCC)   . Gout   . History of chicken pox   . Hyperlipidemia   . Hypertension     Assessment/Plan: 1 Day Post-Op Procedure(s) (LRB): OPEN REDUCTION INTERNAL FIXATION (ORIF) ANKLE FRACTURE (Right) Active Problems:   Bimalleolar ankle fracture  Estimated body mass index is 27.44 kg/m as calculated from the following:   Height as of this encounter:   (1.676 m).   Weight as of this encounter: 77.1 kg (170 lb). Advance diet Up with therapy D/C IV fluids Discharge to SNF  DVT Prophylaxis - None Non-weight-Bearing  to right leg  Dedra Skeens, PA-C Orthopaedic Surgery 04/12/2018, 6:14 AM

## 2018-04-12 NOTE — Progress Notes (Signed)
PT Cancellation Note  Patient Details Name: Teresa Shields MRN: [id number] DOB: [birth year]   Cancelled Treatment:    Reason Eval/Treat Not Completed: Fatigue/lethargy limiting ability to participate Attempted to see pt 3x this afternoon.  Initially she was feeling nauseous and poorly and asked PT to come back, later she was soundly asleep and on the final attempt she had just receive food tray and had also started to have a flush/swollen feeling.  Nursing notified, will try to see pt again tomorrow before d/c.  Malachi Pro, DPT 04/12/2018, 5:32 PM

## 2018-04-12 NOTE — Care Management Obs Status (Signed)
MEDICARE OBSERVATION STATUS NOTIFICATION   Patient Details  Name: WINSLOW VERRILL MRN: [id number] Date of Birth: 1936-09-18   Medicare Observation Status Notification Given:  Yes    Collie Siad, RN 04/12/2018, 11:21 AM

## 2018-04-13 DIAGNOSIS — E1140 Type 2 diabetes mellitus with diabetic neuropathy, unspecified: Secondary | ICD-10-CM | POA: Diagnosis not present

## 2018-04-13 DIAGNOSIS — E1159 Type 2 diabetes mellitus with other circulatory complications: Secondary | ICD-10-CM | POA: Diagnosis not present

## 2018-04-13 DIAGNOSIS — M109 Gout, unspecified: Secondary | ICD-10-CM | POA: Diagnosis not present

## 2018-04-13 DIAGNOSIS — Z781 Physical restraint status: Secondary | ICD-10-CM | POA: Diagnosis not present

## 2018-04-13 DIAGNOSIS — S93431D Sprain of tibiofibular ligament of right ankle, subsequent encounter: Secondary | ICD-10-CM | POA: Diagnosis not present

## 2018-04-13 DIAGNOSIS — Z7982 Long term (current) use of aspirin: Secondary | ICD-10-CM | POA: Diagnosis not present

## 2018-04-13 DIAGNOSIS — F339 Major depressive disorder, recurrent, unspecified: Secondary | ICD-10-CM | POA: Diagnosis not present

## 2018-04-13 DIAGNOSIS — Z7401 Bed confinement status: Secondary | ICD-10-CM | POA: Diagnosis not present

## 2018-04-13 DIAGNOSIS — M25571 Pain in right ankle and joints of right foot: Secondary | ICD-10-CM | POA: Diagnosis not present

## 2018-04-13 DIAGNOSIS — M6281 Muscle weakness (generalized): Secondary | ICD-10-CM | POA: Diagnosis not present

## 2018-04-13 DIAGNOSIS — E782 Mixed hyperlipidemia: Secondary | ICD-10-CM | POA: Diagnosis not present

## 2018-04-13 DIAGNOSIS — S93431A Sprain of tibiofibular ligament of right ankle, initial encounter: Secondary | ICD-10-CM | POA: Diagnosis not present

## 2018-04-13 DIAGNOSIS — E1169 Type 2 diabetes mellitus with other specified complication: Secondary | ICD-10-CM | POA: Diagnosis not present

## 2018-04-13 DIAGNOSIS — S82841S Displaced bimalleolar fracture of right lower leg, sequela: Secondary | ICD-10-CM | POA: Diagnosis not present

## 2018-04-13 DIAGNOSIS — E785 Hyperlipidemia, unspecified: Secondary | ICD-10-CM | POA: Diagnosis not present

## 2018-04-13 DIAGNOSIS — S82841D Displaced bimalleolar fracture of right lower leg, subsequent encounter for closed fracture with routine healing: Secondary | ICD-10-CM | POA: Diagnosis not present

## 2018-04-13 DIAGNOSIS — I10 Essential (primary) hypertension: Secondary | ICD-10-CM | POA: Diagnosis not present

## 2018-04-13 DIAGNOSIS — Z79899 Other long term (current) drug therapy: Secondary | ICD-10-CM | POA: Diagnosis not present

## 2018-04-13 DIAGNOSIS — S93431S Sprain of tibiofibular ligament of right ankle, sequela: Secondary | ICD-10-CM | POA: Diagnosis not present

## 2018-04-13 DIAGNOSIS — S82841A Displaced bimalleolar fracture of right lower leg, initial encounter for closed fracture: Secondary | ICD-10-CM | POA: Diagnosis not present

## 2018-04-13 MED ORDER — ONDANSETRON HCL 4 MG PO TABS: 4.0000 mg | ORAL_TABLET | Freq: Four times a day (QID) | ORAL | 0 refills | Status: DC | PRN

## 2018-04-13 NOTE — Progress Notes (Signed)
EMS called for transport. IV removed. Pt dressed awaiting EMS transport.

## 2018-04-13 NOTE — Progress Notes (Signed)
Physical Therapy Treatment Patient Details Name: Teresa Shields MRN: [id number] DOB: 1936/04/05 Today's Date: 04/13/2018    History of Present Illness 82 y/o female here 4/23 after hyperplantar flexion injury of R ankle.  Ultimately had ORIF repair 5/2.  Pt also had R carpal tunnel repair ~3 weeks ago.    PT Comments    Participated in exercises as described below.  Pt voiced being fearful of getting out of bed today.  Encouragement provided.  Pt was able to get to edge of bed with min verbal cues not to use RUE to push/pull on bed.  Overall did well with improved mobility.  Once sitting she was able to lateral scoot L and R with min guard/assist.  Recliner was brought to bedside with arm dropped and she was able to slide into recliner with min guard/assist.  Tolerated transfer well today. Standing transfer deferred today due to anxiety and session focused on improving her seated mobility and confidence. Remained in recliner at end of session and nurse tech educated on techniques used.   Follow Up Recommendations  SNF     Equipment Recommendations  Rolling walker with 5" wheels    Recommendations for Other Services       Precautions / Restrictions Precautions Precautions: Fall Required Braces or Orthoses: Other Brace/Splint Other Brace/Splint: R ankle in splint Restrictions Weight Bearing Restrictions: Yes RLE Weight Bearing: Non weight bearing Other Position/Activity Restrictions: 3 weeks s/p carpal tunnel sx (generally no weighted gripping, pulling, etc)    Mobility  Bed Mobility Overal bed mobility: Needs Assistance Bed Mobility: Supine to Sit     Supine to sit: Min guard     General bed mobility comments: verbal dues not to use R hand to push  Transfers Overall transfer level: Needs assistance Equipment used: None Transfers: Lateral/Scoot Transfers Sit to Stand: Min assist         General transfer comment: pt did very well with lateral scoot transfers  today.  Ambulation/Gait                 Stairs             Wheelchair Mobility    Modified Rankin (Stroke Patients Only)       Balance Overall balance assessment: Needs assistance Sitting-balance support: Single extremity supported Sitting balance-Leahy Scale: Good Sitting balance - Comments: Pt is able to maintain balance w/o issue       Standing balance comment: not tested                            Cognition Arousal/Alertness: Awake/alert Behavior During Therapy: WFL for tasks assessed/performed Overall Cognitive Status: Within Functional Limits for tasks assessed                                        Exercises Other Exercises Other Exercises: supine ex for BLE for ankle pumps (LLE), quad sets, SLR, heel slides, ab/add, SAQ x 10    General Comments        Pertinent Vitals/Pain Pain Assessment: 0-10 Pain Score: 4  Pain Location: R ankle Pain Descriptors / Indicators: Aching Pain Intervention(s): Limited activity within patient's tolerance    Home Living                      Prior Function  PT Goals (current goals can now be found in the care plan section) Progress towards PT goals: Progressing toward goals    Frequency    BID      PT Plan Current plan remains appropriate    Co-evaluation              AM-PAC PT "6 Clicks" Daily Activity  Outcome Measure  Difficulty turning over in bed (including adjusting bedclothes, sheets and blankets)?: A Little Difficulty moving from lying on back to sitting on the side of the bed? : A Little Difficulty sitting down on and standing up from a chair with arms (e.g., wheelchair, bedside commode, etc,.)?: Unable Help needed moving to and from a bed to chair (including a wheelchair)?: A Lot Help needed walking in hospital room?: Total Help needed climbing 3-5 steps with a railing? : Total 6 Click Score: 11    End of Session Equipment  Utilized During Treatment: Gait belt Activity Tolerance: Patient tolerated treatment well Patient left: with chair alarm set;with call bell/phone within reach;with family/visitor present;in chair Nurse Communication: Mobility status       Time: 1610-9604 PT Time Calculation (min) (ACUTE ONLY): 17 min  Charges:  $Therapeutic Exercise: 8-22 mins                    G Codes:       Danielle Dess, PTA 04/13/18, 11:16 AM

## 2018-04-13 NOTE — Progress Notes (Signed)
  Subjective: 2 Days Post-Op Procedure(s) (LRB): OPEN REDUCTION INTERNAL FIXATION (ORIF) ANKLE FRACTURE (Right) Patient reports pain as mild.  She is having some flushing in her face. Patient seen in rounds with Dr. Rosita Kea. Patient is well, and has had no acute complaints or problems Plan is to go Rehab after hospital stay. Negative for chest pain and shortness of breath Fever: no Gastrointestinal: Negative for nausea and vomiting  Objective: Vital signs in last 24 hours: Temp:  [97.7 F (36.5 C)-98.5 F (36.9 C)] 98.4 F (36.9 C) (05/04 0035) Pulse Rate:  [83-97] 95 (05/04 0035) Resp:  [18-20] 18 (05/04 0035) BP: (143-174)/(52-80) 164/64 (05/04 0035) SpO2:  [96 %-100 %] 99 % (05/04 0035)  Intake/Output from previous day:  Intake/Output Summary (Last 24 hours) at 04/13/2018 0623 Last data filed at 04/12/2018 1700 Gross per 24 hour  Intake 240 ml  Output -  Net 240 ml    Intake/Output this shift: No intake/output data recorded.  Labs: Recent Labs    04/11/18 0906  HGB 12.9   Recent Labs    04/11/18 0906  HCT 38.0   Recent Labs    04/11/18 0906  NA 141  K 3.3*  GLUCOSE 146*   No results for input(s): LABPT, INR in the last 72 hours.   EXAM General - Patient is Alert and Oriented Extremity - Sensation intact distally Dorsiflexion/Plantar flexion intact Dressing/Incision - clean, dry, no drainage Motor Function - intact, moving foot and toes well on exam.   Past Medical History:  Diagnosis Date  . Diabetes mellitus without complication (HCC)   . Gout   . History of chicken pox   . Hyperlipidemia   . Hypertension     Assessment/Plan: 2 Days Post-Op Procedure(s) (LRB): OPEN REDUCTION INTERNAL FIXATION (ORIF) ANKLE FRACTURE (Right) Active Problems:   Bimalleolar ankle fracture  Estimated body mass index is 27.44 kg/m as calculated from the following:   Height as of this encounter:  (1.676 m).   Weight as of this encounter: 77.1 kg (170  lb). Advance diet Up with therapy D/C IV fluids Discharge to SNF today  DVT Prophylaxis - None Non-weight-Bearing  to right leg  Dedra Skeens, PA-C Orthopaedic Surgery 04/13/2018, 6:23 AM

## 2018-04-13 NOTE — Progress Notes (Signed)
Report called and given to [facility] at [facility].

## 2018-04-13 NOTE — Clinical Social Work Note (Signed)
The patient will discharge to [facility] today via non-emergent EMS. The patient and the facility are aware and are in agreement with this plan. The CSW has updated the discharge packet and sent any updated documentation to the facility. The CSW is signing off. Please consult should needs arise.  Argentina Ponder, MSW, Theresia Majors [phone]

## 2018-04-15 DIAGNOSIS — I10 Essential (primary) hypertension: Secondary | ICD-10-CM | POA: Diagnosis not present

## 2018-04-15 DIAGNOSIS — E1140 Type 2 diabetes mellitus with diabetic neuropathy, unspecified: Secondary | ICD-10-CM | POA: Diagnosis not present

## 2018-04-15 DIAGNOSIS — S82841A Displaced bimalleolar fracture of right lower leg, initial encounter for closed fracture: Secondary | ICD-10-CM | POA: Diagnosis not present

## 2018-04-15 DIAGNOSIS — E1159 Type 2 diabetes mellitus with other circulatory complications: Secondary | ICD-10-CM | POA: Diagnosis not present

## 2018-04-16 DIAGNOSIS — S82841D Displaced bimalleolar fracture of right lower leg, subsequent encounter for closed fracture with routine healing: Secondary | ICD-10-CM | POA: Diagnosis not present

## 2018-04-16 DIAGNOSIS — S82841A Displaced bimalleolar fracture of right lower leg, initial encounter for closed fracture: Secondary | ICD-10-CM | POA: Diagnosis not present

## 2018-04-16 DIAGNOSIS — S93431D Sprain of tibiofibular ligament of right ankle, subsequent encounter: Secondary | ICD-10-CM | POA: Diagnosis not present

## 2018-04-26 DIAGNOSIS — S82841A Displaced bimalleolar fracture of right lower leg, initial encounter for closed fracture: Secondary | ICD-10-CM | POA: Diagnosis not present

## 2018-04-26 DIAGNOSIS — E785 Hyperlipidemia, unspecified: Secondary | ICD-10-CM | POA: Diagnosis not present

## 2018-04-26 DIAGNOSIS — E1140 Type 2 diabetes mellitus with diabetic neuropathy, unspecified: Secondary | ICD-10-CM | POA: Diagnosis not present

## 2018-04-26 DIAGNOSIS — S82841S Displaced bimalleolar fracture of right lower leg, sequela: Secondary | ICD-10-CM | POA: Diagnosis not present

## 2018-04-26 DIAGNOSIS — E1169 Type 2 diabetes mellitus with other specified complication: Secondary | ICD-10-CM | POA: Diagnosis not present

## 2018-04-27 DIAGNOSIS — S82841D Displaced bimalleolar fracture of right lower leg, subsequent encounter for closed fracture with routine healing: Secondary | ICD-10-CM | POA: Diagnosis not present

## 2018-04-27 DIAGNOSIS — S82831D Other fracture of upper and lower end of right fibula, subsequent encounter for closed fracture with routine healing: Secondary | ICD-10-CM | POA: Diagnosis not present

## 2018-04-27 DIAGNOSIS — I10 Essential (primary) hypertension: Secondary | ICD-10-CM | POA: Diagnosis not present

## 2018-04-27 DIAGNOSIS — E1140 Type 2 diabetes mellitus with diabetic neuropathy, unspecified: Secondary | ICD-10-CM | POA: Diagnosis not present

## 2018-04-27 DIAGNOSIS — E785 Hyperlipidemia, unspecified: Secondary | ICD-10-CM | POA: Diagnosis not present

## 2018-04-27 DIAGNOSIS — Z7982 Long term (current) use of aspirin: Secondary | ICD-10-CM | POA: Diagnosis not present

## 2018-04-27 DIAGNOSIS — M109 Gout, unspecified: Secondary | ICD-10-CM | POA: Diagnosis not present

## 2018-04-27 DIAGNOSIS — Z9181 History of falling: Secondary | ICD-10-CM | POA: Diagnosis not present

## 2018-04-29 ENCOUNTER — Telehealth: Payer: Self-pay | Admitting: Family Medicine

## 2018-04-29 DIAGNOSIS — Z9181 History of falling: Secondary | ICD-10-CM | POA: Diagnosis not present

## 2018-04-29 DIAGNOSIS — Z7982 Long term (current) use of aspirin: Secondary | ICD-10-CM | POA: Diagnosis not present

## 2018-04-29 DIAGNOSIS — M109 Gout, unspecified: Secondary | ICD-10-CM | POA: Diagnosis not present

## 2018-04-29 DIAGNOSIS — I10 Essential (primary) hypertension: Secondary | ICD-10-CM | POA: Diagnosis not present

## 2018-04-29 DIAGNOSIS — S82841D Displaced bimalleolar fracture of right lower leg, subsequent encounter for closed fracture with routine healing: Secondary | ICD-10-CM | POA: Diagnosis not present

## 2018-04-29 DIAGNOSIS — E785 Hyperlipidemia, unspecified: Secondary | ICD-10-CM | POA: Diagnosis not present

## 2018-04-29 DIAGNOSIS — E1140 Type 2 diabetes mellitus with diabetic neuropathy, unspecified: Secondary | ICD-10-CM | POA: Diagnosis not present

## 2018-04-29 DIAGNOSIS — S82831D Other fracture of upper and lower end of right fibula, subsequent encounter for closed fracture with routine healing: Secondary | ICD-10-CM | POA: Diagnosis not present

## 2018-04-29 NOTE — Telephone Encounter (Signed)
Please advise 

## 2018-04-29 NOTE — Telephone Encounter (Signed)
Teresa Shields was given verbal order. 

## 2018-04-29 NOTE — Telephone Encounter (Signed)
That's fine

## 2018-04-29 NOTE — Telephone Encounter (Signed)
Stephanie requesting OT twice a week for four weeks.  Also faxing over request as well

## 2018-04-30 ENCOUNTER — Other Ambulatory Visit: Payer: Self-pay

## 2018-04-30 DIAGNOSIS — E785 Hyperlipidemia, unspecified: Secondary | ICD-10-CM | POA: Diagnosis not present

## 2018-04-30 DIAGNOSIS — S82841D Displaced bimalleolar fracture of right lower leg, subsequent encounter for closed fracture with routine healing: Secondary | ICD-10-CM | POA: Diagnosis not present

## 2018-04-30 DIAGNOSIS — Z9181 History of falling: Secondary | ICD-10-CM | POA: Diagnosis not present

## 2018-04-30 DIAGNOSIS — I10 Essential (primary) hypertension: Secondary | ICD-10-CM | POA: Diagnosis not present

## 2018-04-30 DIAGNOSIS — E1140 Type 2 diabetes mellitus with diabetic neuropathy, unspecified: Secondary | ICD-10-CM | POA: Diagnosis not present

## 2018-04-30 DIAGNOSIS — S82831D Other fracture of upper and lower end of right fibula, subsequent encounter for closed fracture with routine healing: Secondary | ICD-10-CM | POA: Diagnosis not present

## 2018-04-30 DIAGNOSIS — Z7982 Long term (current) use of aspirin: Secondary | ICD-10-CM | POA: Diagnosis not present

## 2018-04-30 DIAGNOSIS — M109 Gout, unspecified: Secondary | ICD-10-CM | POA: Diagnosis not present

## 2018-04-30 NOTE — Patient Outreach (Signed)
[facility]) Care Management  04/30/2018  Teresa Shields 28-Mar-1936 [id number]   Referral Date: 04/30/18 Referral Source:  [employer] report Date of Admission: 04/13/18 Diagnosis: Right ankle fracture Date of Discharge: 04/26/18 Facility: [facility] Insurance: [facility]  Outreach attempt # 1 spoke with patient.  She is able to verify HIPAA.  Patient reports she is doing good and acknowledges right ankle fracture.  Patient left the facility on Friday. Patient is active with [facility]. Patient has PT, OT and nursing.  Patient using knee scooter and wheelchair for ambulation as she is non-weightbearing at this time.    Social: Patient lives alone but her children live next door and assist with care.    Conditions: Patient with recent Right ankle fracture.  Patient underwent surgery and discharges from [facility] on 04/13/18 to [facility].  Patient discharged from [facility] on 04/26/18.  Medications: Patient reports she takes her medications as prescribed but unable to review medications on call.   Appointments: Patient to see orthopedic surgeon in about 2 weeks and daughter will be transporting.   Consent: RN CM reviewed [facility] services with patient.  Patient declines need for services and no needs assessed on call.   Plan: RN CM will close case at this time.    Bary Leriche, RN, MSN [facility] Care Management Care Management Coordinator Direct Line [phone] Toll Free: [phone]  Fax: [phone]

## 2018-05-01 DIAGNOSIS — M109 Gout, unspecified: Secondary | ICD-10-CM | POA: Diagnosis not present

## 2018-05-01 DIAGNOSIS — Z9181 History of falling: Secondary | ICD-10-CM | POA: Diagnosis not present

## 2018-05-01 DIAGNOSIS — E785 Hyperlipidemia, unspecified: Secondary | ICD-10-CM | POA: Diagnosis not present

## 2018-05-01 DIAGNOSIS — Z7982 Long term (current) use of aspirin: Secondary | ICD-10-CM | POA: Diagnosis not present

## 2018-05-01 DIAGNOSIS — I10 Essential (primary) hypertension: Secondary | ICD-10-CM | POA: Diagnosis not present

## 2018-05-01 DIAGNOSIS — S82831D Other fracture of upper and lower end of right fibula, subsequent encounter for closed fracture with routine healing: Secondary | ICD-10-CM | POA: Diagnosis not present

## 2018-05-01 DIAGNOSIS — E1140 Type 2 diabetes mellitus with diabetic neuropathy, unspecified: Secondary | ICD-10-CM | POA: Diagnosis not present

## 2018-05-01 DIAGNOSIS — S82841D Displaced bimalleolar fracture of right lower leg, subsequent encounter for closed fracture with routine healing: Secondary | ICD-10-CM | POA: Diagnosis not present

## 2018-05-03 DIAGNOSIS — Z9181 History of falling: Secondary | ICD-10-CM | POA: Diagnosis not present

## 2018-05-03 DIAGNOSIS — S82831D Other fracture of upper and lower end of right fibula, subsequent encounter for closed fracture with routine healing: Secondary | ICD-10-CM | POA: Diagnosis not present

## 2018-05-03 DIAGNOSIS — E1140 Type 2 diabetes mellitus with diabetic neuropathy, unspecified: Secondary | ICD-10-CM | POA: Diagnosis not present

## 2018-05-03 DIAGNOSIS — S82841D Displaced bimalleolar fracture of right lower leg, subsequent encounter for closed fracture with routine healing: Secondary | ICD-10-CM | POA: Diagnosis not present

## 2018-05-03 DIAGNOSIS — E785 Hyperlipidemia, unspecified: Secondary | ICD-10-CM | POA: Diagnosis not present

## 2018-05-03 DIAGNOSIS — I10 Essential (primary) hypertension: Secondary | ICD-10-CM | POA: Diagnosis not present

## 2018-05-03 DIAGNOSIS — M109 Gout, unspecified: Secondary | ICD-10-CM | POA: Diagnosis not present

## 2018-05-03 DIAGNOSIS — Z7982 Long term (current) use of aspirin: Secondary | ICD-10-CM | POA: Diagnosis not present

## 2018-05-06 DIAGNOSIS — E1140 Type 2 diabetes mellitus with diabetic neuropathy, unspecified: Secondary | ICD-10-CM | POA: Diagnosis not present

## 2018-05-06 DIAGNOSIS — S82831D Other fracture of upper and lower end of right fibula, subsequent encounter for closed fracture with routine healing: Secondary | ICD-10-CM | POA: Diagnosis not present

## 2018-05-06 DIAGNOSIS — Z9181 History of falling: Secondary | ICD-10-CM | POA: Diagnosis not present

## 2018-05-06 DIAGNOSIS — S82841D Displaced bimalleolar fracture of right lower leg, subsequent encounter for closed fracture with routine healing: Secondary | ICD-10-CM | POA: Diagnosis not present

## 2018-05-06 DIAGNOSIS — E785 Hyperlipidemia, unspecified: Secondary | ICD-10-CM | POA: Diagnosis not present

## 2018-05-06 DIAGNOSIS — Z7982 Long term (current) use of aspirin: Secondary | ICD-10-CM | POA: Diagnosis not present

## 2018-05-06 DIAGNOSIS — I10 Essential (primary) hypertension: Secondary | ICD-10-CM | POA: Diagnosis not present

## 2018-05-06 DIAGNOSIS — M109 Gout, unspecified: Secondary | ICD-10-CM | POA: Diagnosis not present

## 2018-05-07 DIAGNOSIS — E1140 Type 2 diabetes mellitus with diabetic neuropathy, unspecified: Secondary | ICD-10-CM | POA: Diagnosis not present

## 2018-05-07 DIAGNOSIS — M109 Gout, unspecified: Secondary | ICD-10-CM | POA: Diagnosis not present

## 2018-05-07 DIAGNOSIS — E785 Hyperlipidemia, unspecified: Secondary | ICD-10-CM | POA: Diagnosis not present

## 2018-05-07 DIAGNOSIS — S82831D Other fracture of upper and lower end of right fibula, subsequent encounter for closed fracture with routine healing: Secondary | ICD-10-CM | POA: Diagnosis not present

## 2018-05-07 DIAGNOSIS — Z7982 Long term (current) use of aspirin: Secondary | ICD-10-CM | POA: Diagnosis not present

## 2018-05-07 DIAGNOSIS — Z9181 History of falling: Secondary | ICD-10-CM | POA: Diagnosis not present

## 2018-05-07 DIAGNOSIS — I10 Essential (primary) hypertension: Secondary | ICD-10-CM | POA: Diagnosis not present

## 2018-05-07 DIAGNOSIS — S82841D Displaced bimalleolar fracture of right lower leg, subsequent encounter for closed fracture with routine healing: Secondary | ICD-10-CM | POA: Diagnosis not present

## 2018-05-08 ENCOUNTER — Encounter: Payer: Self-pay | Admitting: Family Medicine

## 2018-05-08 ENCOUNTER — Ambulatory Visit (INDEPENDENT_AMBULATORY_CARE_PROVIDER_SITE_OTHER): Payer: Medicare HMO | Admitting: Family Medicine

## 2018-05-08 VITALS — BP 140/60 | HR 77 | Temp 98.4°F | Resp 16

## 2018-05-08 DIAGNOSIS — M792 Neuralgia and neuritis, unspecified: Secondary | ICD-10-CM | POA: Diagnosis not present

## 2018-05-08 DIAGNOSIS — Z7982 Long term (current) use of aspirin: Secondary | ICD-10-CM | POA: Diagnosis not present

## 2018-05-08 DIAGNOSIS — Z9181 History of falling: Secondary | ICD-10-CM | POA: Diagnosis not present

## 2018-05-08 DIAGNOSIS — S82891A Other fracture of right lower leg, initial encounter for closed fracture: Secondary | ICD-10-CM

## 2018-05-08 DIAGNOSIS — M109 Gout, unspecified: Secondary | ICD-10-CM | POA: Diagnosis not present

## 2018-05-08 DIAGNOSIS — E785 Hyperlipidemia, unspecified: Secondary | ICD-10-CM | POA: Diagnosis not present

## 2018-05-08 DIAGNOSIS — E1140 Type 2 diabetes mellitus with diabetic neuropathy, unspecified: Secondary | ICD-10-CM

## 2018-05-08 DIAGNOSIS — S82899A Other fracture of unspecified lower leg, initial encounter for closed fracture: Secondary | ICD-10-CM | POA: Insufficient documentation

## 2018-05-08 DIAGNOSIS — S82841D Displaced bimalleolar fracture of right lower leg, subsequent encounter for closed fracture with routine healing: Secondary | ICD-10-CM | POA: Diagnosis not present

## 2018-05-08 DIAGNOSIS — E876 Hypokalemia: Secondary | ICD-10-CM

## 2018-05-08 DIAGNOSIS — S82831D Other fracture of upper and lower end of right fibula, subsequent encounter for closed fracture with routine healing: Secondary | ICD-10-CM | POA: Diagnosis not present

## 2018-05-08 DIAGNOSIS — I10 Essential (primary) hypertension: Secondary | ICD-10-CM | POA: Diagnosis not present

## 2018-05-08 MED ORDER — DULOXETINE HCL 30 MG PO CPEP: 30.0000 mg | ORAL_CAPSULE | Freq: Every day | ORAL | 1 refills | Status: DC

## 2018-05-08 NOTE — Progress Notes (Signed)
Patient: Teresa Shields Female    DOB: Jul 12, 1936   82 y.o.   MRN: [id number] Visit Date: 05/08/2018  Today's Provider: Mila Merry, MD   Chief Complaint  Patient presents with  . Hospitalization Follow-up   Subjective:    HPI   Follow up Hospitalization  Patient was admitted to [facility] on 04/11/2018 and discharged on 04/13/2018. She was treated for right ankle fracture. Treatment for this included; patient underwent surgery and discharged on 04/13/2018 from [facility] to [facility] Patient was discharged from [facility] on 04/26/2018. Telephone follow up was done on 04/30/2018 She reports good compliance with treatment. She reports this condition is Improved.  She was noted to be mildly hypokalemic during her hospitalization. She states that since then she has been eating a banana every other day.  -----------------------------------------------------------------   Patient has an appt with orthopedics on 05/24/2018 to have cast removed.   Allergies  Allergen Reactions  . Naproxen Diarrhea     Current Outpatient Medications:  .  acetaminophen (TYLENOL) 500 MG tablet, Take 1,000 mg by mouth 2 (two) times daily as needed (for pain.)., Disp: , Rfl:  .  allopurinol (ZYLOPRIM) 300 MG tablet, TAKE 1 TABLET EVERY DAY, Disp: 90 tablet, Rfl: 4 .  Ascorbic Acid (VITAMIN C) 1000 MG tablet, Take 1,000 mg by mouth daily., Disp: , Rfl:  .  aspirin EC 81 MG tablet, Take 81 mg by mouth daily., Disp: , Rfl:  .  atorvastatin (LIPITOR) 40 MG tablet, TAKE 1 TABLET AT BEDTIME FOR CHOLESTEROL, Disp: 90 tablet, Rfl: 4 .  calcium-vitamin D (OSCAL 500/200 D-3) 500-200 MG-UNIT per tablet, Take 1 tablet by mouth daily with breakfast. , Disp: , Rfl:  .  colchicine 0.6 MG tablet, 2 tablets at first sign of gout, then one daily as needed (Patient taking differently: Take 0.6-1.2 mg by mouth See admin instructions. 2 tablets at first sign of gout, then one daily as needed for gout flare  ups), Disp: 90 tablet, Rfl: 1 .  DULoxetine (CYMBALTA) 30 MG capsule, Take 30 mg by mouth at bedtime., Disp: , Rfl:  .  gabapentin (NEURONTIN) 100 MG capsule, TAKE 1 CAPSULE TWICE DAILY, Disp: 180 capsule, Rfl: 4 .  HYDROcodone-acetaminophen (NORCO/VICODIN) 5-325 MG tablet, Take 1-2 tablets by mouth every 4 (four) hours as needed for moderate pain., Disp: 30 tablet, Rfl: 0 .  indomethacin (INDOCIN) 25 MG capsule, Take 1-2 capsules (25-50 mg total) by mouth 2 (two) times daily with a meal. For gout (Patient taking differently: Take 25-50 mg by mouth 2 (two) times daily as needed (for gout flare ups). ), Disp: 30 capsule, Rfl: 1 .  metoprolol succinate (TOPROL-XL) 50 MG 24 hr tablet, TAKE 1 TABLET EVERY DAY FOR BLOOD PRESSURE (Patient taking differently: TAKE 1 TABLET EVERY DAY IN THE EVENING FOR BLOOD PRESSURE), Disp: 90 tablet, Rfl: 4 .  Multiple Vitamin (MULTIVITAMIN WITH MINERALS) TABS tablet, Take 1 tablet by mouth daily., Disp: , Rfl:  .  ondansetron (ZOFRAN) 4 MG tablet, Take 1-2 tablets (4-8 mg total) by mouth every 6 (six) hours as needed for nausea., Disp: 20 tablet, Rfl: 0 .  triamterene-hydrochlorothiazide (MAXZIDE) 75-50 MG tablet, TAKE 1 TABLET EVERY DAY, Disp: 90 tablet, Rfl: 4 .  Vitamin E 400 UNITS TABS, Take 400 Units by mouth daily. , Disp: , Rfl:   Review of Systems  Constitutional: Negative for appetite change, chills, fatigue and fever.  Respiratory: Negative for chest tightness and shortness  of breath.   Cardiovascular: Negative for chest pain and palpitations.  Gastrointestinal: Negative for abdominal pain, nausea and vomiting.  Neurological: Negative for dizziness and weakness.    Social History   Tobacco Use  . Smoking status: Never Smoker  . Smokeless tobacco: Never Used  Substance Use Topics  . Alcohol use: No   Objective:   BP 140/60 (BP Location: Left Arm, Patient Position: Sitting, Cuff Size: Large)   Pulse 77   Temp 98.4 F (36.9 C) (Oral)   Resp 16    SpO2 95%  Vitals:   05/08/18 1050  BP: 140/60  Pulse: 77  Resp: 16  Temp: 98.4 F (36.9 C)  TempSrc: Oral  SpO2: 95%     Physical Exam   General Appearance:    Alert, cooperative, no distress  Eyes:    PERRL, conjunctiva/corneas clear, EOM's intact       Lungs:     Clear to auscultation bilaterally, respirations unlabored  Heart:    Regular rate and rhythm  Neurologic:   Awake, alert, oriented x 3. No apparent focal neurological           defect.   Ext:  No edema of either foot. Ankle and foot cast in place on right. N/v intact.        Assessment & Plan:     1. Hypokalemia  - Renal function panel  2. Type 2 diabetes mellitus with diabetic neuropathy, without long-term current use of insulin (HCC) Due for- Hemoglobin A1c  3. Closed fracture of right ankle, initial encounter Healing well, follow up orthopedics, as scheduled.   4. Neuralgia She was prescribed Cymbalta by neurology and reports it has been effective she requests- DULoxetine (CYMBALTA) 30 MG capsule; Take 1 capsule (30 mg total) by mouth at bedtime.  Dispense: 90 capsule; Refill: 1 sent to her mail order pharmacy.        Mila Merry, MD  [facility]

## 2018-05-09 ENCOUNTER — Telehealth: Payer: Self-pay

## 2018-05-09 DIAGNOSIS — S82831D Other fracture of upper and lower end of right fibula, subsequent encounter for closed fracture with routine healing: Secondary | ICD-10-CM | POA: Diagnosis not present

## 2018-05-09 DIAGNOSIS — E1140 Type 2 diabetes mellitus with diabetic neuropathy, unspecified: Secondary | ICD-10-CM | POA: Diagnosis not present

## 2018-05-09 DIAGNOSIS — E785 Hyperlipidemia, unspecified: Secondary | ICD-10-CM | POA: Diagnosis not present

## 2018-05-09 DIAGNOSIS — Z9181 History of falling: Secondary | ICD-10-CM | POA: Diagnosis not present

## 2018-05-09 DIAGNOSIS — M109 Gout, unspecified: Secondary | ICD-10-CM | POA: Diagnosis not present

## 2018-05-09 DIAGNOSIS — I10 Essential (primary) hypertension: Secondary | ICD-10-CM | POA: Diagnosis not present

## 2018-05-09 DIAGNOSIS — Z7982 Long term (current) use of aspirin: Secondary | ICD-10-CM | POA: Diagnosis not present

## 2018-05-09 DIAGNOSIS — S82841D Displaced bimalleolar fracture of right lower leg, subsequent encounter for closed fracture with routine healing: Secondary | ICD-10-CM | POA: Diagnosis not present

## 2018-05-09 LAB — RENAL FUNCTION PANEL
Albumin: 3.8 g/dL (ref 3.5–4.8)
BUN/Creatinine Ratio: 20 (ref 12–28)
BUN: 19 mg/dL (ref 8–27)
CO2: 24 mmol/L (ref 20–29)
CREATININE: 0.97 mg/dL (ref 0.57–1.00)
Calcium: 9.3 mg/dL (ref 8.7–10.3)
Chloride: 97 mmol/L (ref 96–106)
GFR calc non Af Amer: 56 mL/min/{1.73_m2} — ABNORMAL LOW (ref >59)
GFR, EST AFRICAN AMERICAN: 64 mL/min/{1.73_m2} (ref >59)
Glucose: 231 mg/dL — ABNORMAL HIGH (ref 65–99)
PHOSPHORUS: 2.1 mg/dL — AB (ref 2.5–4.5)
Potassium: 3.6 mmol/L (ref 3.5–5.2)
SODIUM: 143 mmol/L (ref 134–144)

## 2018-05-09 LAB — HEMOGLOBIN A1C
ESTIMATED AVERAGE GLUCOSE: 126 mg/dL
Hgb A1c MFr Bld: 6 % — ABNORMAL HIGH (ref 4.8–5.6)

## 2018-05-09 NOTE — Telephone Encounter (Signed)
Patient is returning your call and requesting a call back. °

## 2018-05-09 NOTE — Telephone Encounter (Signed)
Patient advised and verbally voiced understanding.  

## 2018-05-09 NOTE — Telephone Encounter (Signed)
Tried calling patient. Left message to call back. 

## 2018-05-09 NOTE — Telephone Encounter (Signed)
-----   Message from [name], MD sent at 05/09/2018  8:12 AM EDT ----- a1c is good at 6.0%. Potassium is back up to normal. Continue current medications.  Follow  Up in august as scheduled.

## 2018-05-11 DIAGNOSIS — S82841D Displaced bimalleolar fracture of right lower leg, subsequent encounter for closed fracture with routine healing: Secondary | ICD-10-CM | POA: Diagnosis not present

## 2018-05-11 DIAGNOSIS — E785 Hyperlipidemia, unspecified: Secondary | ICD-10-CM | POA: Diagnosis not present

## 2018-05-11 DIAGNOSIS — Z7982 Long term (current) use of aspirin: Secondary | ICD-10-CM | POA: Diagnosis not present

## 2018-05-11 DIAGNOSIS — M109 Gout, unspecified: Secondary | ICD-10-CM | POA: Diagnosis not present

## 2018-05-11 DIAGNOSIS — E1140 Type 2 diabetes mellitus with diabetic neuropathy, unspecified: Secondary | ICD-10-CM | POA: Diagnosis not present

## 2018-05-11 DIAGNOSIS — Z9181 History of falling: Secondary | ICD-10-CM | POA: Diagnosis not present

## 2018-05-11 DIAGNOSIS — S82831D Other fracture of upper and lower end of right fibula, subsequent encounter for closed fracture with routine healing: Secondary | ICD-10-CM | POA: Diagnosis not present

## 2018-05-11 DIAGNOSIS — I10 Essential (primary) hypertension: Secondary | ICD-10-CM | POA: Diagnosis not present

## 2018-05-14 DIAGNOSIS — E785 Hyperlipidemia, unspecified: Secondary | ICD-10-CM | POA: Diagnosis not present

## 2018-05-14 DIAGNOSIS — Z9181 History of falling: Secondary | ICD-10-CM | POA: Diagnosis not present

## 2018-05-14 DIAGNOSIS — E1140 Type 2 diabetes mellitus with diabetic neuropathy, unspecified: Secondary | ICD-10-CM | POA: Diagnosis not present

## 2018-05-14 DIAGNOSIS — S82831D Other fracture of upper and lower end of right fibula, subsequent encounter for closed fracture with routine healing: Secondary | ICD-10-CM | POA: Diagnosis not present

## 2018-05-14 DIAGNOSIS — Z7982 Long term (current) use of aspirin: Secondary | ICD-10-CM | POA: Diagnosis not present

## 2018-05-14 DIAGNOSIS — M109 Gout, unspecified: Secondary | ICD-10-CM | POA: Diagnosis not present

## 2018-05-14 DIAGNOSIS — I10 Essential (primary) hypertension: Secondary | ICD-10-CM | POA: Diagnosis not present

## 2018-05-14 DIAGNOSIS — S82841D Displaced bimalleolar fracture of right lower leg, subsequent encounter for closed fracture with routine healing: Secondary | ICD-10-CM | POA: Diagnosis not present

## 2018-05-15 DIAGNOSIS — S82831D Other fracture of upper and lower end of right fibula, subsequent encounter for closed fracture with routine healing: Secondary | ICD-10-CM | POA: Diagnosis not present

## 2018-05-15 DIAGNOSIS — I10 Essential (primary) hypertension: Secondary | ICD-10-CM | POA: Diagnosis not present

## 2018-05-15 DIAGNOSIS — Z7982 Long term (current) use of aspirin: Secondary | ICD-10-CM | POA: Diagnosis not present

## 2018-05-15 DIAGNOSIS — S82841D Displaced bimalleolar fracture of right lower leg, subsequent encounter for closed fracture with routine healing: Secondary | ICD-10-CM | POA: Diagnosis not present

## 2018-05-15 DIAGNOSIS — Z9181 History of falling: Secondary | ICD-10-CM | POA: Diagnosis not present

## 2018-05-15 DIAGNOSIS — E785 Hyperlipidemia, unspecified: Secondary | ICD-10-CM | POA: Diagnosis not present

## 2018-05-15 DIAGNOSIS — E1140 Type 2 diabetes mellitus with diabetic neuropathy, unspecified: Secondary | ICD-10-CM | POA: Diagnosis not present

## 2018-05-15 DIAGNOSIS — M109 Gout, unspecified: Secondary | ICD-10-CM | POA: Diagnosis not present

## 2018-05-16 DIAGNOSIS — E1140 Type 2 diabetes mellitus with diabetic neuropathy, unspecified: Secondary | ICD-10-CM | POA: Diagnosis not present

## 2018-05-16 DIAGNOSIS — E785 Hyperlipidemia, unspecified: Secondary | ICD-10-CM | POA: Diagnosis not present

## 2018-05-16 DIAGNOSIS — S82841D Displaced bimalleolar fracture of right lower leg, subsequent encounter for closed fracture with routine healing: Secondary | ICD-10-CM | POA: Diagnosis not present

## 2018-05-16 DIAGNOSIS — I10 Essential (primary) hypertension: Secondary | ICD-10-CM | POA: Diagnosis not present

## 2018-05-16 DIAGNOSIS — S82831D Other fracture of upper and lower end of right fibula, subsequent encounter for closed fracture with routine healing: Secondary | ICD-10-CM | POA: Diagnosis not present

## 2018-05-16 DIAGNOSIS — M109 Gout, unspecified: Secondary | ICD-10-CM | POA: Diagnosis not present

## 2018-05-16 DIAGNOSIS — Z9181 History of falling: Secondary | ICD-10-CM | POA: Diagnosis not present

## 2018-05-16 DIAGNOSIS — Z7982 Long term (current) use of aspirin: Secondary | ICD-10-CM | POA: Diagnosis not present

## 2018-05-17 DIAGNOSIS — Z9181 History of falling: Secondary | ICD-10-CM | POA: Diagnosis not present

## 2018-05-17 DIAGNOSIS — S82831D Other fracture of upper and lower end of right fibula, subsequent encounter for closed fracture with routine healing: Secondary | ICD-10-CM | POA: Diagnosis not present

## 2018-05-17 DIAGNOSIS — I10 Essential (primary) hypertension: Secondary | ICD-10-CM | POA: Diagnosis not present

## 2018-05-17 DIAGNOSIS — M109 Gout, unspecified: Secondary | ICD-10-CM | POA: Diagnosis not present

## 2018-05-17 DIAGNOSIS — Z7982 Long term (current) use of aspirin: Secondary | ICD-10-CM | POA: Diagnosis not present

## 2018-05-17 DIAGNOSIS — E785 Hyperlipidemia, unspecified: Secondary | ICD-10-CM | POA: Diagnosis not present

## 2018-05-17 DIAGNOSIS — S82841D Displaced bimalleolar fracture of right lower leg, subsequent encounter for closed fracture with routine healing: Secondary | ICD-10-CM | POA: Diagnosis not present

## 2018-05-17 DIAGNOSIS — E1140 Type 2 diabetes mellitus with diabetic neuropathy, unspecified: Secondary | ICD-10-CM | POA: Diagnosis not present

## 2018-05-22 DIAGNOSIS — S82831D Other fracture of upper and lower end of right fibula, subsequent encounter for closed fracture with routine healing: Secondary | ICD-10-CM | POA: Diagnosis not present

## 2018-05-22 DIAGNOSIS — Z9181 History of falling: Secondary | ICD-10-CM | POA: Diagnosis not present

## 2018-05-22 DIAGNOSIS — M109 Gout, unspecified: Secondary | ICD-10-CM | POA: Diagnosis not present

## 2018-05-22 DIAGNOSIS — Z7982 Long term (current) use of aspirin: Secondary | ICD-10-CM | POA: Diagnosis not present

## 2018-05-22 DIAGNOSIS — E785 Hyperlipidemia, unspecified: Secondary | ICD-10-CM | POA: Diagnosis not present

## 2018-05-22 DIAGNOSIS — S82841D Displaced bimalleolar fracture of right lower leg, subsequent encounter for closed fracture with routine healing: Secondary | ICD-10-CM | POA: Diagnosis not present

## 2018-05-22 DIAGNOSIS — I10 Essential (primary) hypertension: Secondary | ICD-10-CM | POA: Diagnosis not present

## 2018-05-22 DIAGNOSIS — E1140 Type 2 diabetes mellitus with diabetic neuropathy, unspecified: Secondary | ICD-10-CM | POA: Diagnosis not present

## 2018-05-23 DIAGNOSIS — Z967 Presence of other bone and tendon implants: Secondary | ICD-10-CM | POA: Diagnosis not present

## 2018-05-23 DIAGNOSIS — Z8781 Personal history of (healed) traumatic fracture: Secondary | ICD-10-CM | POA: Diagnosis not present

## 2018-05-24 ENCOUNTER — Telehealth: Payer: Self-pay | Admitting: Family Medicine

## 2018-05-24 DIAGNOSIS — I10 Essential (primary) hypertension: Secondary | ICD-10-CM | POA: Diagnosis not present

## 2018-05-24 DIAGNOSIS — Z7982 Long term (current) use of aspirin: Secondary | ICD-10-CM | POA: Diagnosis not present

## 2018-05-24 DIAGNOSIS — E785 Hyperlipidemia, unspecified: Secondary | ICD-10-CM | POA: Diagnosis not present

## 2018-05-24 DIAGNOSIS — Z9181 History of falling: Secondary | ICD-10-CM | POA: Diagnosis not present

## 2018-05-24 DIAGNOSIS — S82841D Displaced bimalleolar fracture of right lower leg, subsequent encounter for closed fracture with routine healing: Secondary | ICD-10-CM | POA: Diagnosis not present

## 2018-05-24 DIAGNOSIS — E1140 Type 2 diabetes mellitus with diabetic neuropathy, unspecified: Secondary | ICD-10-CM | POA: Diagnosis not present

## 2018-05-24 DIAGNOSIS — M109 Gout, unspecified: Secondary | ICD-10-CM | POA: Diagnosis not present

## 2018-05-24 DIAGNOSIS — S82831D Other fracture of upper and lower end of right fibula, subsequent encounter for closed fracture with routine healing: Secondary | ICD-10-CM | POA: Diagnosis not present

## 2018-05-24 NOTE — Telephone Encounter (Signed)
Teresa Shields with [facility] called wanting to request additional OT 2 times for 3 weeks and 1 time a week for 1 week  Verbal order  Call back [phone]   Thanks teri

## 2018-05-24 NOTE — Telephone Encounter (Signed)
Advised  ED 

## 2018-05-24 NOTE — Telephone Encounter (Signed)
That's fine

## 2018-05-24 NOTE — Telephone Encounter (Signed)
Please advise 

## 2018-05-28 DIAGNOSIS — E1140 Type 2 diabetes mellitus with diabetic neuropathy, unspecified: Secondary | ICD-10-CM | POA: Diagnosis not present

## 2018-05-28 DIAGNOSIS — E785 Hyperlipidemia, unspecified: Secondary | ICD-10-CM | POA: Diagnosis not present

## 2018-05-28 DIAGNOSIS — Z9181 History of falling: Secondary | ICD-10-CM | POA: Diagnosis not present

## 2018-05-28 DIAGNOSIS — I10 Essential (primary) hypertension: Secondary | ICD-10-CM | POA: Diagnosis not present

## 2018-05-28 DIAGNOSIS — S82841D Displaced bimalleolar fracture of right lower leg, subsequent encounter for closed fracture with routine healing: Secondary | ICD-10-CM | POA: Diagnosis not present

## 2018-05-28 DIAGNOSIS — M109 Gout, unspecified: Secondary | ICD-10-CM | POA: Diagnosis not present

## 2018-05-28 DIAGNOSIS — Z7982 Long term (current) use of aspirin: Secondary | ICD-10-CM | POA: Diagnosis not present

## 2018-05-28 DIAGNOSIS — S82831D Other fracture of upper and lower end of right fibula, subsequent encounter for closed fracture with routine healing: Secondary | ICD-10-CM | POA: Diagnosis not present

## 2018-05-29 DIAGNOSIS — M109 Gout, unspecified: Secondary | ICD-10-CM | POA: Diagnosis not present

## 2018-05-29 DIAGNOSIS — Z7982 Long term (current) use of aspirin: Secondary | ICD-10-CM | POA: Diagnosis not present

## 2018-05-29 DIAGNOSIS — Z9181 History of falling: Secondary | ICD-10-CM | POA: Diagnosis not present

## 2018-05-29 DIAGNOSIS — I10 Essential (primary) hypertension: Secondary | ICD-10-CM | POA: Diagnosis not present

## 2018-05-29 DIAGNOSIS — E785 Hyperlipidemia, unspecified: Secondary | ICD-10-CM | POA: Diagnosis not present

## 2018-05-29 DIAGNOSIS — E1140 Type 2 diabetes mellitus with diabetic neuropathy, unspecified: Secondary | ICD-10-CM | POA: Diagnosis not present

## 2018-05-29 DIAGNOSIS — S82841D Displaced bimalleolar fracture of right lower leg, subsequent encounter for closed fracture with routine healing: Secondary | ICD-10-CM | POA: Diagnosis not present

## 2018-05-29 DIAGNOSIS — S82831D Other fracture of upper and lower end of right fibula, subsequent encounter for closed fracture with routine healing: Secondary | ICD-10-CM | POA: Diagnosis not present

## 2018-06-10 ENCOUNTER — Other Ambulatory Visit: Payer: Self-pay | Admitting: Family Medicine

## 2018-06-17 DIAGNOSIS — Z967 Presence of other bone and tendon implants: Secondary | ICD-10-CM | POA: Diagnosis not present

## 2018-06-17 DIAGNOSIS — Z8781 Personal history of (healed) traumatic fracture: Secondary | ICD-10-CM | POA: Diagnosis not present

## 2018-06-25 IMAGING — CT CT ANKLE*R* W/O CM
3 series · 14 of 33 positions shown, 17 images · non-contrast
Comparison: None.

CLINICAL DATA: Pt arrives via ems from home reports of feeling
dizzy and clammy. Pt states she felt dizzy and went to sit down,
denies falling and hitting her head.

EXAM:
CT OF THE RIGHT ANKLE WITHOUT CONTRAST
TECHNIQUE: Multidetector CT imaging of the right ankle was performed according
to the standard protocol. Multiplanar CT image reconstructions were
also generated.

[Series 8: axial st · axial · 0.28mm/px · z∈[-515,-394]mm · 6 of 161 slices shown, 8 images]
[im 25/161  soft-tissue]
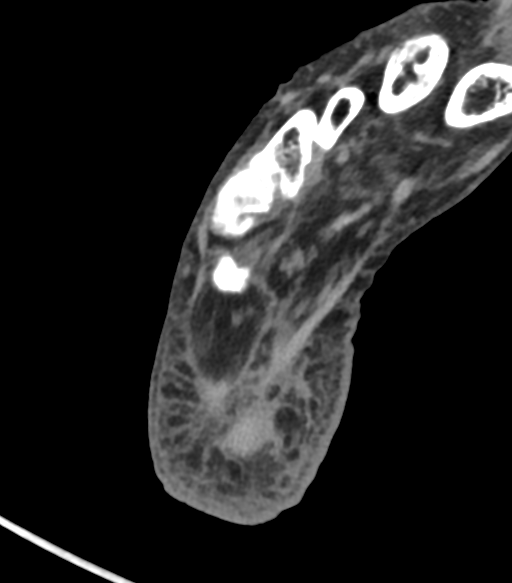
[im 25/161  bone]
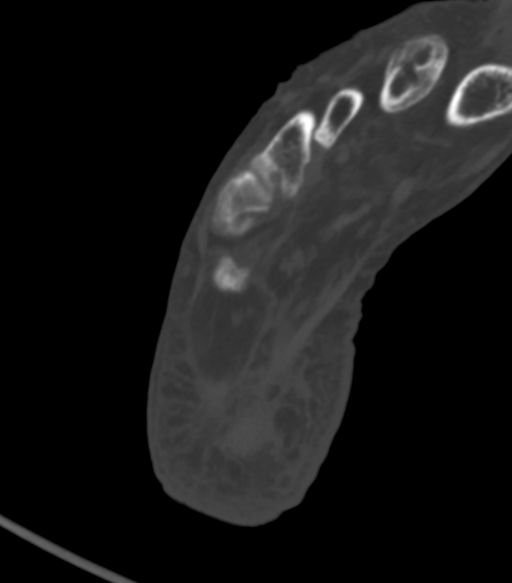
[im 50/161  bone]
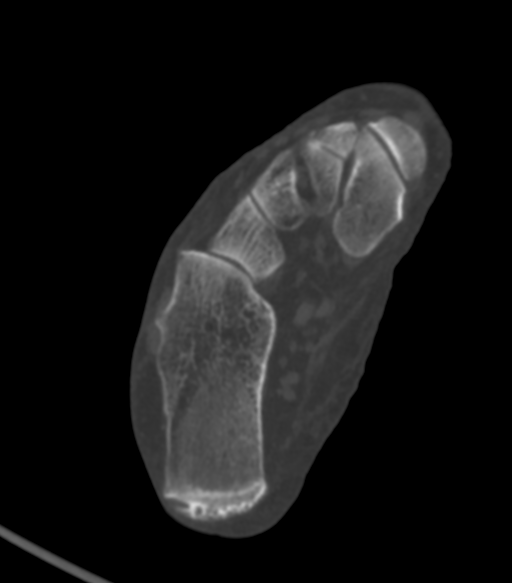
[im 74/161  bone]
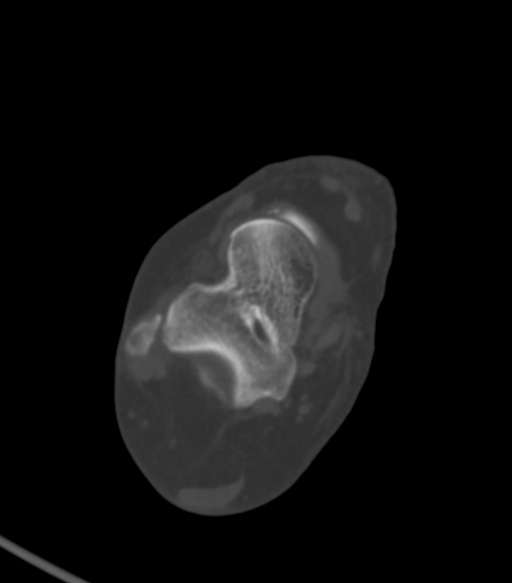
[im 99/161  bone]
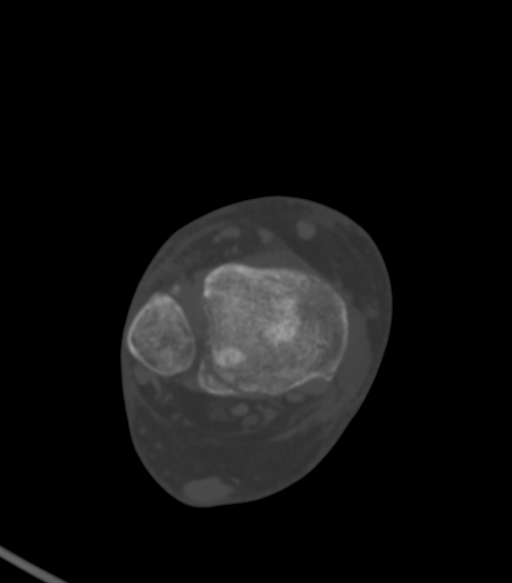
[im 124/161  soft-tissue]
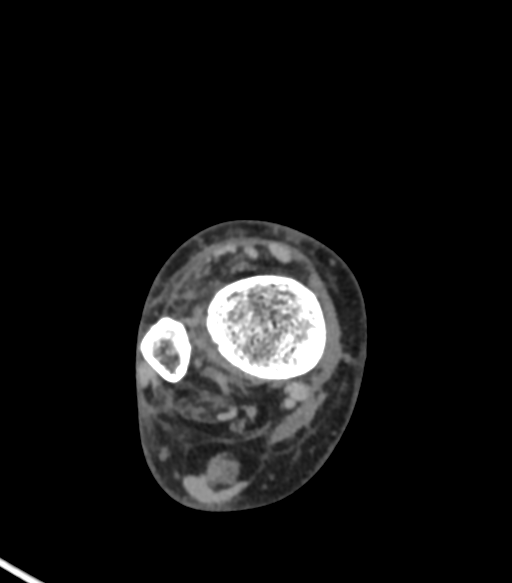
[im 124/161  bone]
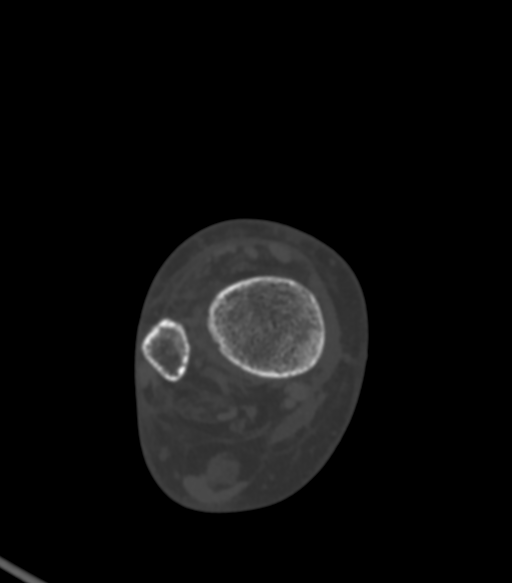
[im 148/161  bone]
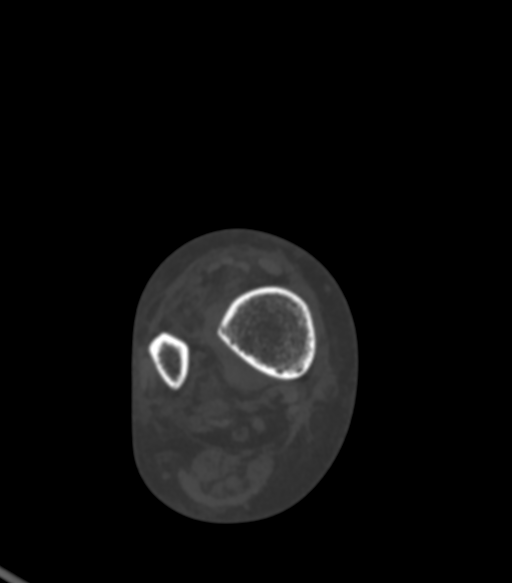

[Series 9: cor st · coronal · 0.23mm/px · 3 of 124 slices shown]
[im 25/124  bone]
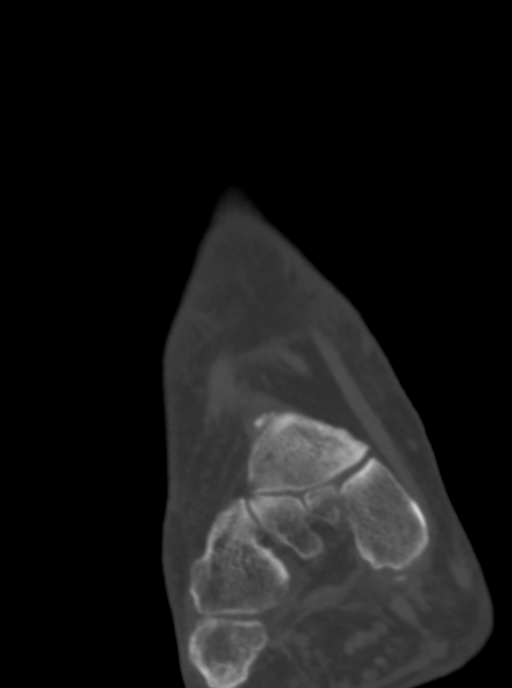
[im 50/124  bone]
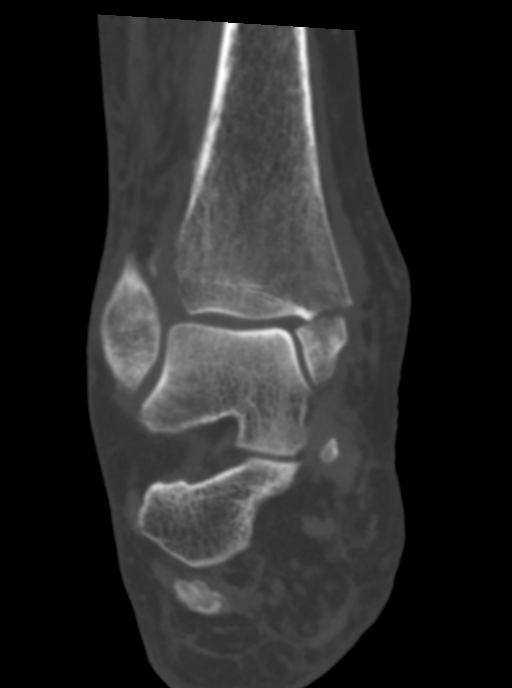
[im 74/124  bone]
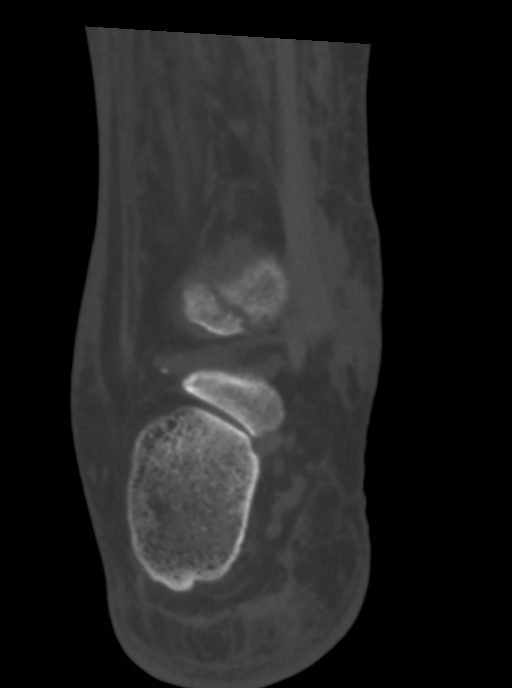

[Series 10: sag st · sagittal · 0.31mm/px · 5 of 102 slices shown, 6 images]
[im 34/102  bone]
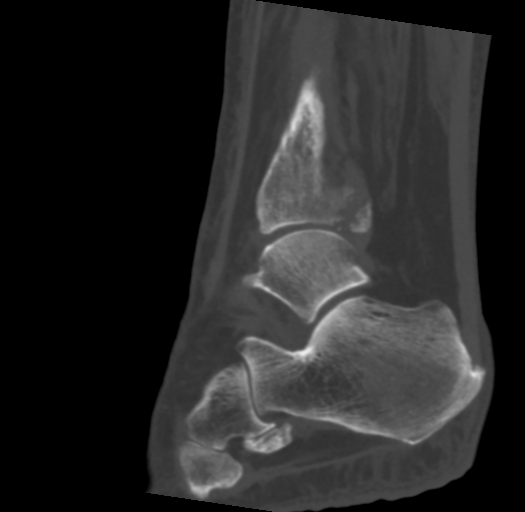
[im 43/102  bone]
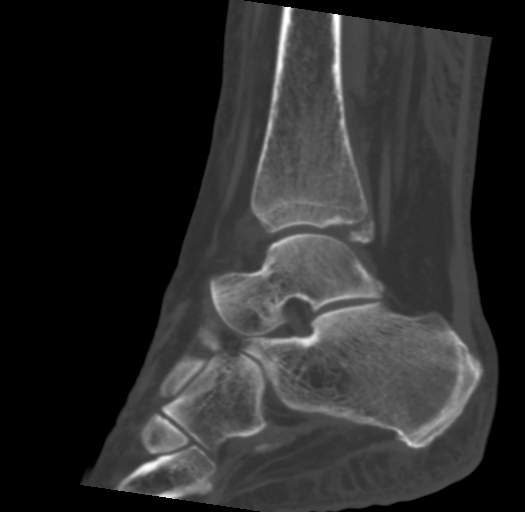
[im 51/102  soft-tissue]
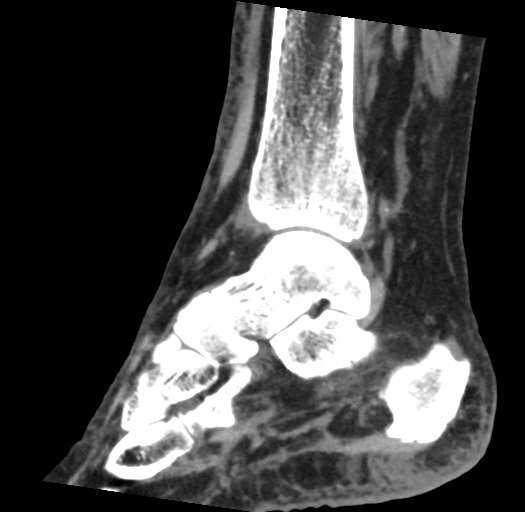
[im 51/102  bone]
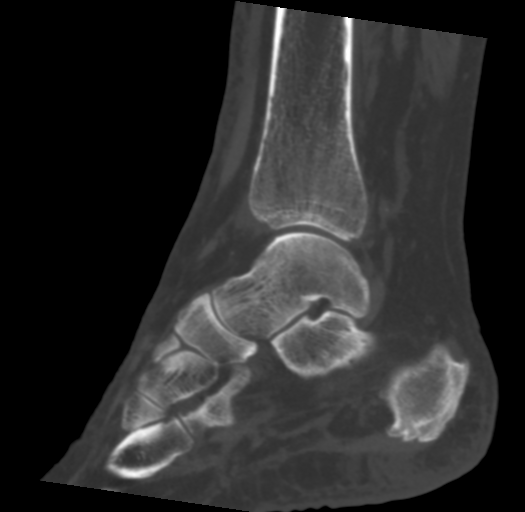
[im 59/102  bone]
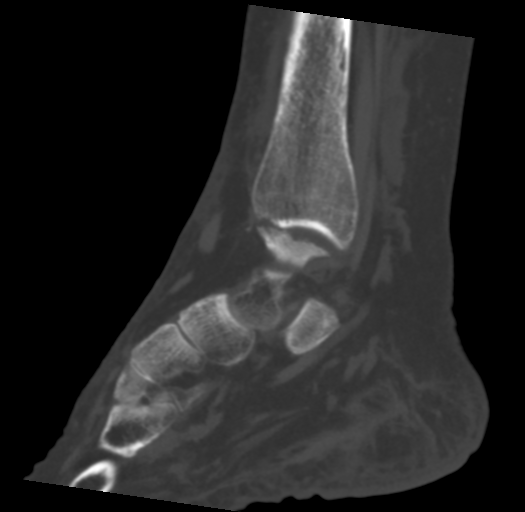
[im 68/102  bone]
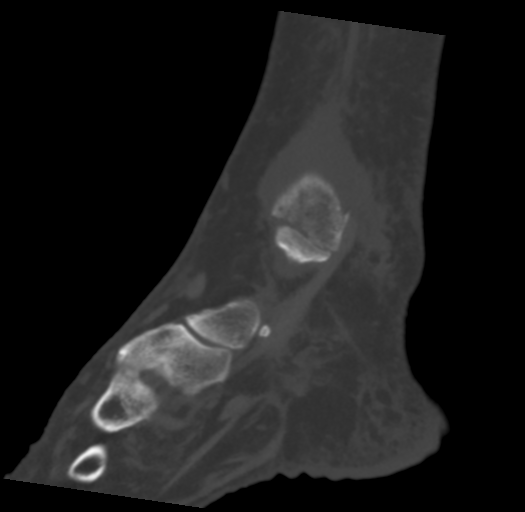

[14 of 33 positions shown; findings below may reference images not displayed]

FINDINGS: Bones/Joint/Cartilage

Transverse mildly displaced medial malleolar fracture with 4 mm of
lateral displacement. Mildly displaced and comminuted fracture of
posterior malleolus with 6 mm of posterior displacement. Widening of
the distal tibiofibular joint concerning for syndesmotic injury.
Avulsion fracture of the anterolateral tibia at the anterior
tibiofibular ligament insertion.

No other acute fracture or dislocation. Old second metatarsal neck
fracture. Normal alignment. No joint effusion. Incidental note made
of an os peroneus.

Ligaments

Ligaments are suboptimally evaluated by CT.

Muscles and Tendons
Generalized muscle atrophy. Intact flexor, extensor, peroneal and
Achilles tendon.

Soft tissue
No fluid collection or hematoma.  No soft tissue mass.
IMPRESSION: 1. Transverse mildly displaced medial malleolar fracture with 4 mm
of lateral displacement. Mildly displaced and comminuted fracture of
posterior malleolus with 6 mm of posterior displacement. Avulsion
fracture of the anterolateral tibia at the anterior tibiofibular
ligament insertion. Widening of the distal tibiofibular joint
concerning for syndesmotic injury.

## 2018-07-23 ENCOUNTER — Ambulatory Visit: Payer: Self-pay | Admitting: Family Medicine

## 2018-07-25 NOTE — Progress Notes (Signed)
Patient: Teresa Shields Female    DOB: 1936/04/14   82 y.o.   MRN: [id number] Visit Date: 07/26/2018  Today's Provider: Mila Merryonald Pete Schnitzer, MD   Chief Complaint  Patient presents with  . Diabetes  . Hypertension   Subjective:    HPI   Diabetes Mellitus Type II, Follow-up:   Lab Results  Component Value Date   HGBA1C 6.8 (A) 07/26/2018   HGBA1C 6.0 (H) 05/08/2018   HGBA1C 6.7 (H) 01/21/2018   Last seen for diabetes 3 months ago.  Management since then includes; no changes. She reports good compliance with treatment. She is not having side effects.  Current symptoms include none and have been stable. Home blood sugar records: blood sugars are not checked  Episodes of hypoglycemia? no   Current Insulin Regimen: none Most Recent Eye Exam: 5 years ago Weight trend: decreasing steadily Prior visit with dietician: no Current diet: well balanced Current exercise: walking  ------------------------------------------------------------------------   Hypertension, follow-up:  BP Readings from Last 3 Encounters:  07/26/18 (!) 146/74  05/08/18 140/60  04/13/18 (!) 161/67    She was last seen for hypertension 3 months ago.  BP at that visit was 136/80. Management since that visit includes; no changes.She reports good compliance with treatment. She is not having side effects.  She is exercising. She is not adherent to low salt diet.   Outside blood pressures are 130/70. She is experiencing lower extremity edema.  Patient denies chest pain, chest pressure/discomfort, claudication, dyspnea, exertional chest pressure/discomfort, fatigue, irregular heart beat, near-syncope, orthopnea, palpitations, paroxysmal nocturnal dyspnea, syncope and tachypnea.   Cardiovascular risk factors include advanced age (older than 6955 for men, 1365 for women), diabetes mellitus and hypertension.  Use of agents associated with hypertension: NSAIDS.    ------------------------------------------------------------------------  Her primary concern today is that she has been having rare dizzy spell the last four months. She fell and broke her leg during a dizzy spell in April. Since then she has 3 more episodes, and one fall 4 days ago.  She states the first episode was a few days after starting Cymbalta, so she she stopped taking Cymbalta after her recent fall 4 days ago. Episodes are sudden in onset, lasts several minutes, then feels perfectly normal Dizziness is described as feeling light headed and weak, but no spinning sensation. No chest pains, palpitations, or dyspnea.   Allergies  Allergen Reactions  . Naproxen Diarrhea     Current Outpatient Medications:  .  acetaminophen (TYLENOL) 500 MG tablet, Take 1,000 mg by mouth 2 (two) times daily as needed (for pain.)., Disp: , Rfl:  .  allopurinol (ZYLOPRIM) 300 MG tablet, TAKE 1 TABLET EVERY DAY, Disp: 90 tablet, Rfl: 4 .  Ascorbic Acid (VITAMIN C) 1000 MG tablet, Take 1,000 mg by mouth daily., Disp: , Rfl:  .  aspirin EC 81 MG tablet, Take 81 mg by mouth daily., Disp: , Rfl:  .  atorvastatin (LIPITOR) 40 MG tablet, TAKE 1 TABLET AT BEDTIME FOR CHOLESTEROL, Disp: 90 tablet, Rfl: 4 .  calcium-vitamin D (OSCAL 500/200 D-3) 500-200 MG-UNIT per tablet, Take 1 tablet by mouth daily with breakfast. , Disp: , Rfl:  .  colchicine 0.6 MG tablet, 2 tablets at first sign of gout, then one daily as needed, Disp: 90 tablet, Rfl: 1 .  gabapentin (NEURONTIN) 100 MG capsule, TAKE 1 CAPSULE TWICE DAILY, Disp: 180 capsule, Rfl: 4 .  HYDROcodone-acetaminophen (NORCO/VICODIN) 5-325 MG tablet, Take 1-2 tablets by mouth every  4 (four) hours as needed for moderate pain., Disp: 30 tablet, Rfl: 0 .  indomethacin (INDOCIN) 25 MG capsule, Take 1-2 capsules (25-50 mg total) by mouth 2 (two) times daily with a meal. For gout (Patient taking differently: Take 25-50 mg by mouth 2 (two) times daily as needed (for gout  flare ups). ), Disp: 30 capsule, Rfl: 1 .  metoprolol succinate (TOPROL-XL) 50 MG 24 hr tablet, TAKE 1 TABLET EVERY DAY FOR BLOOD PRESSURE, Disp: 90 tablet, Rfl: 4 .  Multiple Vitamin (MULTIVITAMIN WITH MINERALS) TABS tablet, Take 1 tablet by mouth daily., Disp: , Rfl:  .  triamterene-hydrochlorothiazide (MAXZIDE) 75-50 MG tablet, TAKE 1 TABLET EVERY DAY, Disp: 90 tablet, Rfl: 4 .  Vitamin E 400 UNITS TABS, Take 400 Units by mouth daily. , Disp: , Rfl:  .  DULoxetine (CYMBALTA) 30 MG capsule, Take 1 capsule (30 mg total) by mouth at bedtime. (Patient not taking: Reported on 07/26/2018), Disp: 90 capsule, Rfl: 1  Review of Systems  Constitutional: Negative for appetite change, chills, fatigue and fever.  Respiratory: Negative for chest tightness and shortness of breath.   Cardiovascular: Positive for leg swelling (right leg). Negative for chest pain and palpitations.  Gastrointestinal: Negative for abdominal pain, nausea and vomiting.  Neurological: Negative for dizziness and weakness.    Social History   Tobacco Use  . Smoking status: Never Smoker  . Smokeless tobacco: Never Used  Substance Use Topics  . Alcohol use: No   Objective:   BP (!) 146/74 (BP Location: Right Arm, Patient Position: Sitting, Cuff Size: Large)   Pulse 69   Temp 97.6 F (36.4 C) (Oral)   Resp 16   Wt 161 lb (73 kg)   SpO2 99% Comment: room air  BMI 25.99 kg/m  Vitals:   07/26/18 0950  BP: (!) 146/74  Pulse: 69  Resp: 16  Temp: 97.6 F (36.4 C)  TempSrc: Oral  SpO2: 99%  Weight: 161 lb (73 kg)     Physical Exam   General Appearance:    Alert, cooperative, no distress  Eyes:    PERRL, conjunctiva/corneas clear, EOM's intact       Lungs:     Clear to auscultation bilaterally, respirations unlabored  Heart:    Regular rate and rhythm  Neurologic:   Awake, alert, oriented x 3. No apparent focal neurological           defect.       Results for orders placed or performed in visit on 07/26/18   POCT HgB A1C  Result Value Ref Range   Hemoglobin A1C 6.8 (A) 4.0 - 5.6 %   HbA1c POC (<> result, manual entry)     HbA1c, POC (prediabetic range)     HbA1c, POC (controlled diabetic range)     Est. average glucose Bld gHb Est-mCnc 148        Assessment & Plan:     1. Type 2 diabetes mellitus with diabetic neuropathy, without long-term current use of insulin (HCC) Well controlled.  Continue current medications.   - POCT HgB A1C  2. Near syncope  - Comprehensive metabolic panel - CBC - Troponin I - Magnesium  Consider cardiac event monitor if labs are normal.       Mila Merryonald Marck Mcclenny, MD  [facility]

## 2018-07-26 ENCOUNTER — Encounter: Payer: Self-pay | Admitting: Family Medicine

## 2018-07-26 ENCOUNTER — Ambulatory Visit (INDEPENDENT_AMBULATORY_CARE_PROVIDER_SITE_OTHER): Payer: Medicare HMO | Admitting: Family Medicine

## 2018-07-26 VITALS — BP 146/74 | HR 69 | Temp 97.6°F | Resp 16 | Wt 161.0 lb

## 2018-07-26 DIAGNOSIS — E1140 Type 2 diabetes mellitus with diabetic neuropathy, unspecified: Secondary | ICD-10-CM

## 2018-07-26 DIAGNOSIS — R55 Syncope and collapse: Secondary | ICD-10-CM | POA: Diagnosis not present

## 2018-07-26 LAB — POCT GLYCOSYLATED HEMOGLOBIN (HGB A1C)
Est. average glucose Bld gHb Est-mCnc: 148
Hemoglobin A1C: 6.8 % — AB (ref 4.0–5.6)

## 2018-07-27 LAB — TROPONIN I: Troponin I: <0.01 ng/mL (ref 0.00–0.04)

## 2018-07-27 LAB — CBC
Hematocrit: 43.3 % (ref 34.0–46.6)
Hemoglobin: 15.1 g/dL (ref 11.1–15.9)
MCH: 31.8 pg (ref 26.6–33.0)
MCHC: 34.9 g/dL (ref 31.5–35.7)
MCV: 91 fL (ref 79–97)
Platelets: 313 10*3/uL (ref 150–450)
RBC: 4.75 x10E6/uL (ref 3.77–5.28)
RDW: 14.7 % (ref 12.3–15.4)
WBC: 12.8 10*3/uL — ABNORMAL HIGH (ref 3.4–10.8)

## 2018-07-27 LAB — MAGNESIUM: Magnesium: 1.6 mg/dL (ref 1.6–2.3)

## 2018-07-27 LAB — COMPREHENSIVE METABOLIC PANEL
ALBUMIN: 4.3 g/dL (ref 3.5–4.8)
ALT: 27 IU/L (ref 0–32)
AST: 24 IU/L (ref 0–40)
Albumin/Globulin Ratio: 1.8 (ref 1.2–2.2)
Alkaline Phosphatase: 130 IU/L — ABNORMAL HIGH (ref 39–117)
BUN / CREAT RATIO: 20 (ref 12–28)
BUN: 18 mg/dL (ref 8–27)
Bilirubin Total: 0.5 mg/dL (ref 0.0–1.2)
CALCIUM: 10.4 mg/dL — AB (ref 8.7–10.3)
CO2: 27 mmol/L (ref 20–29)
CREATININE: 0.88 mg/dL (ref 0.57–1.00)
Chloride: 99 mmol/L (ref 96–106)
GFR, EST AFRICAN AMERICAN: 72 mL/min/{1.73_m2} (ref >59)
GFR, EST NON AFRICAN AMERICAN: 63 mL/min/{1.73_m2} (ref >59)
GLOBULIN, TOTAL: 2.4 g/dL (ref 1.5–4.5)
Glucose: 119 mg/dL — ABNORMAL HIGH (ref 65–99)
Potassium: 4.2 mmol/L (ref 3.5–5.2)
SODIUM: 144 mmol/L (ref 134–144)
TOTAL PROTEIN: 6.7 g/dL (ref 6.0–8.5)

## 2018-07-29 ENCOUNTER — Other Ambulatory Visit: Payer: Self-pay | Admitting: Family Medicine

## 2018-07-29 DIAGNOSIS — R55 Syncope and collapse: Secondary | ICD-10-CM

## 2018-07-29 DIAGNOSIS — R42 Dizziness and giddiness: Secondary | ICD-10-CM

## 2018-07-30 ENCOUNTER — Telehealth: Payer: Self-pay

## 2018-07-30 NOTE — Telephone Encounter (Signed)
Pt advised.   Thanks,   -Laura  

## 2018-07-30 NOTE — Telephone Encounter (Signed)
-----   Message from Malva Limesonald E [name], MD sent at 07/29/2018  1:35 PM EDT ----- Labs are essentially normal. Nothing to explain her dizzy spells. Need to proceed with cardiac event monitor. Have sent order to [facility], patient should hear back about this before the end of the week.

## 2018-08-02 ENCOUNTER — Telehealth: Payer: Self-pay

## 2018-08-02 NOTE — Telephone Encounter (Signed)
This patient had a BMD order placed on 01/15/2018. Has this referral been done? I don't see any documentation for an appointment that was scheduled.

## 2018-08-07 ENCOUNTER — Telehealth: Payer: Self-pay | Admitting: Family Medicine

## 2018-08-07 DIAGNOSIS — R42 Dizziness and giddiness: Secondary | ICD-10-CM

## 2018-08-07 DIAGNOSIS — R55 Syncope and collapse: Secondary | ICD-10-CM

## 2018-08-07 NOTE — Telephone Encounter (Signed)
[facility] will need an order for the cardiac event monitor faxed to [phone].Pt's appointment is 08/09/18

## 2018-08-08 NOTE — Telephone Encounter (Signed)
Order was faxed to [facility].

## 2018-08-09 DIAGNOSIS — R55 Syncope and collapse: Secondary | ICD-10-CM | POA: Diagnosis not present

## 2018-08-09 NOTE — Telephone Encounter (Signed)
It was scheduled [place]/18 at [place].Looks like pt cancelled it

## 2018-08-15 ENCOUNTER — Other Ambulatory Visit: Payer: Self-pay | Admitting: Family Medicine

## 2018-08-21 ENCOUNTER — Telehealth: Payer: Self-pay | Admitting: Family Medicine

## 2018-08-21 NOTE — Telephone Encounter (Signed)
Patient has had heart monitor on since end of August and wants to know how long she needs to wear it?  She thought Dr Sherrie Mustache said 48 hours but [facility] told her 30 days.

## 2018-08-21 NOTE — Telephone Encounter (Signed)
Pt advised.   Thanks,   -Laura  

## 2018-08-21 NOTE — Telephone Encounter (Signed)
We ordered a cardiac event monitor which is 30 days.

## 2018-09-13 ENCOUNTER — Telehealth: Payer: Self-pay | Admitting: Family Medicine

## 2018-09-13 NOTE — Telephone Encounter (Signed)
Results of heart monitor are in and everything is completely normal. No sign of any type of heart arrhythmia.

## 2018-09-16 NOTE — Telephone Encounter (Signed)
Pt advised.   Thanks,   -Damita Eppard  

## 2018-09-24 ENCOUNTER — Ambulatory Visit (INDEPENDENT_AMBULATORY_CARE_PROVIDER_SITE_OTHER): Payer: Medicare HMO | Admitting: Family Medicine

## 2018-09-24 DIAGNOSIS — Z23 Encounter for immunization: Secondary | ICD-10-CM | POA: Diagnosis not present

## 2019-01-09 ENCOUNTER — Telehealth: Payer: Self-pay

## 2019-01-09 NOTE — Telephone Encounter (Signed)
LMTCB and schedule AWV for this year. -MM 

## 2019-02-24 NOTE — Telephone Encounter (Signed)
This encounter was created in error - please disregard.

## 2019-02-24 NOTE — Telephone Encounter (Signed)
Pt declined scheduling an AWV this year. FYI to PCP! -MM 

## 2019-03-07 ENCOUNTER — Other Ambulatory Visit: Payer: Self-pay | Admitting: Family Medicine

## 2019-05-15 ENCOUNTER — Other Ambulatory Visit: Payer: Self-pay | Admitting: Family Medicine

## 2019-06-06 ENCOUNTER — Other Ambulatory Visit: Payer: Self-pay | Admitting: Family Medicine

## 2019-06-06 DIAGNOSIS — E1140 Type 2 diabetes mellitus with diabetic neuropathy, unspecified: Secondary | ICD-10-CM

## 2019-06-20 ENCOUNTER — Other Ambulatory Visit: Payer: Self-pay | Admitting: Family Medicine

## 2019-08-29 ENCOUNTER — Other Ambulatory Visit: Payer: Self-pay | Admitting: Family Medicine

## 2019-09-17 ENCOUNTER — Ambulatory Visit (INDEPENDENT_AMBULATORY_CARE_PROVIDER_SITE_OTHER): Payer: Medicare HMO | Admitting: Family Medicine

## 2019-09-17 ENCOUNTER — Other Ambulatory Visit: Payer: Self-pay

## 2019-09-17 ENCOUNTER — Encounter: Payer: Self-pay | Admitting: Family Medicine

## 2019-09-17 VITALS — BP 145/75 | HR 99 | Temp 96.9°F | Resp 15 | Wt 162.8 lb

## 2019-09-17 DIAGNOSIS — R202 Paresthesia of skin: Secondary | ICD-10-CM

## 2019-09-17 DIAGNOSIS — Z23 Encounter for immunization: Secondary | ICD-10-CM

## 2019-09-17 DIAGNOSIS — E1140 Type 2 diabetes mellitus with diabetic neuropathy, unspecified: Secondary | ICD-10-CM | POA: Diagnosis not present

## 2019-09-17 DIAGNOSIS — R42 Dizziness and giddiness: Secondary | ICD-10-CM | POA: Diagnosis not present

## 2019-09-17 DIAGNOSIS — G3281 Cerebellar ataxia in diseases classified elsewhere: Secondary | ICD-10-CM | POA: Diagnosis not present

## 2019-09-17 DIAGNOSIS — E782 Mixed hyperlipidemia: Secondary | ICD-10-CM | POA: Diagnosis not present

## 2019-09-17 DIAGNOSIS — R200 Anesthesia of skin: Secondary | ICD-10-CM

## 2019-09-17 DIAGNOSIS — E2839 Other primary ovarian failure: Secondary | ICD-10-CM

## 2019-09-17 LAB — POCT GLYCOSYLATED HEMOGLOBIN (HGB A1C): Hemoglobin A1C: 6.8 % — AB (ref 4.0–5.6)

## 2019-09-17 NOTE — Progress Notes (Signed)
Patient: Teresa Shields Female    DOB: 1936/04/30   83 y.o.   MRN: [id number] Visit Date: 09/17/2019  Today's Provider: Mila Merry, MD   Chief Complaint  Patient presents with  . Diabetes  . Hyperlipidemia  . Hypertension  . Dizziness   Subjective:       Dizziness This is a recurrent problem. The current episode started more than 1 year ago. The problem occurs constantly. The problem has been gradually worsening. Associated symptoms include vertigo. Pertinent negatives include no abdominal pain, anorexia, arthralgias, change in bowel habit, chest pain, chills, congestion, coughing, diaphoresis, fatigue, fever, headaches, joint swelling, myalgias, nausea, neck pain, numbness, rash, sore throat, swollen glands, urinary symptoms, visual change, vomiting or weakness. The symptoms are aggravated by bending, exertion and standing. She has tried nothing for the symptoms.  States that she has numbness and tingling in both hands for the last several months. She had seen Dr. Malvin Johns last year for numbness in hands, but has not had neuro evaluation for difficulty with balance. She did sustain an ankle fracture from fall last year.     Diabetes Mellitus Type II, Follow-up:   Lab Results  Component Value Date   HGBA1C 6.8 (A) 09/17/2019   HGBA1C 6.8 (A) 07/26/2018   HGBA1C 6.0 (H) 05/08/2018   Last seen for diabetes 14 months ago.  Management since then includes none. Current symptoms include none and have been stable.   Current Insulin Regimen: N/A Most Recent Eye Exam: 5+ years patient reports Weight trend: stable Prior visit with dietician: no Current diet: well balanced Current exercise: walking  ------------------------------------------------------------------------   Hypertension, follow-up:  BP Readings from Last 3 Encounters:  09/17/19 (!) 145/75  07/26/18 (!) 146/74  05/08/18 140/60    She was last seen for hypertension 20 months ago.  BP at that visit  was 136/80. Management since that visit includes none.She reports excellent compliance with treatment. She is not having side effects.  She is exercising. She is adherent to low salt diet.   Outside blood pressures are systolic 117-133, and diastolic 70-82. She is experiencing none.  Patient denies chest pain, chest pressure/discomfort, claudication, dyspnea, exertional chest pressure/discomfort, fatigue, irregular heart beat, lower extremity edema, near-syncope, orthopnea, palpitations, paroxysmal nocturnal dyspnea, syncope and tachypnea.   Cardiovascular risk factors include advanced age (older than 51 for men, 24 for women), diabetes mellitus and hypertension.  Use of agents associated with hypertension: NSAIDS.   ------------------------------------------------------------------------    Lipid/Cholesterol, Follow-up:   Last seen for this 20 months ago.  Management since that visit includes none.  Last Lipid Panel:    Component Value Date/Time   CHOL 156 01/21/2018 0902   TRIG 349 (H) 01/21/2018 0902   HDL 36 (L) 01/21/2018 0902   CHOLHDL 4.3 01/21/2018 0902   LDLCALC 50 01/21/2018 0902    She reports excellent compliance with treatment. She is not having side effects.   Wt Readings from Last 3 Encounters:  09/17/19 162 lb 12.8 oz (73.8 kg)  07/26/18 161 lb (73 kg)  04/11/18 170 lb (77.1 kg)    ------------------------------------------------------------------------  Allergies  Allergen Reactions  . Naproxen Diarrhea     Current Outpatient Medications:  .  acetaminophen (TYLENOL) 500 MG tablet, Take 1,000 mg by mouth 2 (two) times daily as needed (for pain.)., Disp: , Rfl:  .  allopurinol (ZYLOPRIM) 300 MG tablet, TAKE 1 TABLET EVERY DAY, Disp: 90 tablet, Rfl: 4 .  Ascorbic Acid (VITAMIN C)  1000 MG tablet, Take 1,000 mg by mouth daily., Disp: , Rfl:  .  aspirin EC 81 MG tablet, Take 81 mg by mouth daily., Disp: , Rfl:  .  atorvastatin (LIPITOR) 40 MG tablet,  Take 1 tablet (40 mg total) by mouth every evening., Disp: 90 tablet, Rfl: 4 .  calcium-vitamin D (OSCAL 500/200 D-3) 500-200 MG-UNIT per tablet, Take 1 tablet by mouth daily with breakfast. , Disp: , Rfl:  .  colchicine 0.6 MG tablet, 2 tablets at first sign of gout, then one daily as needed, Disp: 90 tablet, Rfl: 1 .  gabapentin (NEURONTIN) 100 MG capsule, TAKE 1 CAPSULE TWICE DAILY, Disp: 180 capsule, Rfl: 4 .  HYDROcodone-acetaminophen (NORCO/VICODIN) 5-325 MG tablet, Take 1-2 tablets by mouth every 4 (four) hours as needed for moderate pain., Disp: 30 tablet, Rfl: 0 .  indomethacin (INDOCIN) 25 MG capsule, Take 1-2 capsules (25-50 mg total) by mouth 2 (two) times daily with a meal. For gout (Patient taking differently: Take 25-50 mg by mouth 2 (two) times daily as needed (for gout flare ups). ), Disp: 30 capsule, Rfl: 1 .  metoprolol succinate (TOPROL-XL) 50 MG 24 hr tablet, Take 1 tablet (50 mg total) by mouth daily. For high blood pressure, Disp: 90 tablet, Rfl: 4 .  Multiple Vitamin (MULTIVITAMIN WITH MINERALS) TABS tablet, Take 1 tablet by mouth daily., Disp: , Rfl:  .  triamterene-hydrochlorothiazide (MAXZIDE) 75-50 MG tablet, TAKE 1 TABLET EVERY DAY, Disp: 90 tablet, Rfl: 4 .  Vitamin E 400 UNITS TABS, Take 400 Units by mouth daily. , Disp: , Rfl:   Review of Systems  Constitutional: Negative for chills, diaphoresis, fatigue and fever.  HENT: Negative for congestion and sore throat.   Respiratory: Negative for cough.   Cardiovascular: Negative for chest pain.  Gastrointestinal: Negative for abdominal pain, anorexia, change in bowel habit, nausea and vomiting.  Musculoskeletal: Negative for arthralgias, joint swelling, myalgias and neck pain.  Skin: Negative for rash.  Neurological: Positive for dizziness and vertigo. Negative for weakness, numbness and headaches.    Social History   Tobacco Use  . Smoking status: Never Smoker  . Smokeless tobacco: Never Used  Substance Use  Topics  . Alcohol use: No      Objective:   BP (!) 145/75   Pulse 99   Temp (!) 96.9 F (36.1 C) (Oral)   Resp 15   Wt 162 lb 12.8 oz (73.8 kg)   BMI 26.28 kg/m  Vitals:   09/17/19 0845  BP: (!) 145/75  Pulse: 99  Resp: 15  Temp: (!) 96.9 F (36.1 C)  TempSrc: Oral  Weight: 162 lb 12.8 oz (73.8 kg)  Body mass index is 26.28 kg/m.   Physical Exam  General Appearance:    Well developed, well nourished female in no acute distress  Eyes:    PERRL, conjunctiva/corneas clear, EOM's intact       Lungs:     Clear to auscultation bilaterally, respirations unlabored  Heart:    Normal heart rate. Normal rhythm. No murmurs, rubs, or gallops.   MS:   All extremities are intact.   Neurologic:   Awake, alert, oriented x 3. Difficulty with finger to nose. Negative Romberg. Stumbles to left when with her normal walking gait without cain.        Results for orders placed or performed in visit on 09/17/19  POCT glycosylated hemoglobin (Hb A1C)  Result Value Ref Range   Hemoglobin A1C 6.8 (A) 4.0 - 5.6 %  HbA1c POC (<> result, manual entry)     HbA1c, POC (prediabetic range)     HbA1c, POC (controlled diabetic range)         Assessment & Plan    1. Type 2 diabetes mellitus with diabetic neuropathy, without long-term current use of insulin (HCC) Well controlled.  Diet controlled.   2. Cerebellar ataxia in diseases classified elsewhere [facility]) Concerning for cns lesion. She does have multiple stroke risk factors. Is already on daily ECASA - MR Brain W Wo Contrast; Future  3. Estrogen deficiency  - DG Bone Density; Future  4. Dizziness  - CBC - MR Brain W Wo Contrast; Future  5. Hyperlipidemia, mixed She is tolerating atorvastatin well with no adverse effects.   - Comprehensive metabolic panel - Lipid panel - CBC  6. Numbness and tingling  - MR Brain W Wo Contrast; Future - Vitamin B12  7. Need for influenza vaccination  - Flu Vaccine QUAD High Dose(Fluad)      Lelon Huh, MD  [facility]

## 2019-09-17 NOTE — Patient Instructions (Signed)
.   Please review the attached list of medications and notify my office if there are any errors.   . Please bring all of your medications to every appointment so we can make sure that our medication list is the same as yours.   . It is especially important to get the annual flu vaccine this year. If you haven't had it already, please go to your pharmacy or call the office as soon as possible to schedule you flu shot.  

## 2019-09-18 ENCOUNTER — Telehealth: Payer: Self-pay

## 2019-09-18 LAB — COMPREHENSIVE METABOLIC PANEL
ALT: 33 IU/L — ABNORMAL HIGH (ref 0–32)
AST: 22 IU/L (ref 0–40)
Albumin/Globulin Ratio: 2 (ref 1.2–2.2)
Albumin: 4.2 g/dL (ref 3.6–4.6)
Alkaline Phosphatase: 130 IU/L — ABNORMAL HIGH (ref 39–117)
BUN/Creatinine Ratio: 23 (ref 12–28)
BUN: 25 mg/dL (ref 8–27)
Bilirubin Total: 0.4 mg/dL (ref 0.0–1.2)
CO2: 25 mmol/L (ref 20–29)
Calcium: 9.6 mg/dL (ref 8.7–10.3)
Chloride: 100 mmol/L (ref 96–106)
Creatinine, Ser: 1.09 mg/dL — ABNORMAL HIGH (ref 0.57–1.00)
GFR calc Af Amer: 55 mL/min/{1.73_m2} — ABNORMAL LOW (ref >59)
GFR calc non Af Amer: 48 mL/min/{1.73_m2} — ABNORMAL LOW (ref >59)
Globulin, Total: 2.1 g/dL (ref 1.5–4.5)
Glucose: 169 mg/dL — ABNORMAL HIGH (ref 65–99)
Potassium: 3.9 mmol/L (ref 3.5–5.2)
Sodium: 144 mmol/L (ref 134–144)
Total Protein: 6.3 g/dL (ref 6.0–8.5)

## 2019-09-18 LAB — CBC
Hematocrit: 40.8 % (ref 34.0–46.6)
Hemoglobin: 14.4 g/dL (ref 11.1–15.9)
MCH: 32.4 pg (ref 26.6–33.0)
MCHC: 35.3 g/dL (ref 31.5–35.7)
MCV: 92 fL (ref 79–97)
Platelets: 283 10*3/uL (ref 150–450)
RBC: 4.44 x10E6/uL (ref 3.77–5.28)
RDW: 13.3 % (ref 11.7–15.4)
WBC: 11.4 10*3/uL — ABNORMAL HIGH (ref 3.4–10.8)

## 2019-09-18 LAB — LIPID PANEL
Chol/HDL Ratio: 4.7 ratio — ABNORMAL HIGH (ref 0.0–4.4)
Cholesterol, Total: 165 mg/dL (ref 100–199)
HDL: 35 mg/dL — ABNORMAL LOW (ref >39)
LDL Chol Calc (NIH): 52 mg/dL (ref 0–99)
Triglycerides: 527 mg/dL — ABNORMAL HIGH (ref 0–149)
VLDL Cholesterol Cal: 78 mg/dL — ABNORMAL HIGH (ref 5–40)

## 2019-09-18 LAB — VITAMIN B12: Vitamin B-12: >2000 pg/mL — ABNORMAL HIGH (ref 232–1245)

## 2019-09-18 NOTE — Telephone Encounter (Signed)
-----   Message from Birdie Sons, MD sent at 09/18/2019 10:22 AM EDT ----- Triglycerides are high, recommend she take OTC fish oil capsule 1000mg  twice a day. Rest of labs are normal. Continue current medications.

## 2019-09-18 NOTE — Telephone Encounter (Signed)
Pt advised.   Thanks,   -Herta Hink  

## 2019-09-28 ENCOUNTER — Ambulatory Visit: Payer: Medicare HMO

## 2019-10-07 ENCOUNTER — Ambulatory Visit
Admission: RE | Admit: 2019-10-07 | Discharge: 2019-10-07 | Disposition: A | Discharge status: Home / Self Care | Payer: Medicare HMO | Source: Ambulatory Visit | Attending: Family Medicine | Admitting: Family Medicine

## 2019-10-07 ENCOUNTER — Telehealth: Payer: Self-pay

## 2019-10-07 DIAGNOSIS — M8589 Other specified disorders of bone density and structure, multiple sites: Secondary | ICD-10-CM | POA: Diagnosis not present

## 2019-10-07 DIAGNOSIS — E2839 Other primary ovarian failure: Secondary | ICD-10-CM | POA: Insufficient documentation

## 2019-10-07 NOTE — Telephone Encounter (Signed)
Patient advised of results.

## 2019-10-07 NOTE — Telephone Encounter (Signed)
-----   Message from Birdie Sons, MD sent at 10/07/2019  1:20 PM EDT ----- BMD shows osteopenia, but is unchanged since 2012. Repeat in about 3 years.

## 2020-01-23 ENCOUNTER — Other Ambulatory Visit: Payer: Self-pay

## 2020-01-23 ENCOUNTER — Ambulatory Visit: Payer: Medicare HMO | Attending: Internal Medicine

## 2020-01-23 DIAGNOSIS — Z23 Encounter for immunization: Secondary | ICD-10-CM | POA: Insufficient documentation

## 2020-01-23 NOTE — Progress Notes (Signed)
   Covid-19 Vaccination Clinic  Name:  Teresa Shields    MRN: [id number] DOB: [birth year]  01/23/2020  [name] was observed post Covid-19 immunization for 15 minutes without incidence. She was provided with Vaccine Information Sheet and instruction to access the V-Safe system.   [name] was instructed to call 911 with any severe reactions post vaccine: Marland Kitchen Difficulty breathing  . Swelling of your face and throat  . A fast heartbeat  . A bad rash all over your body  . Dizziness and weakness    Immunizations Administered    Name Date Dose VIS Date Route   Pfizer COVID-19 Vaccine 01/23/2020  9:17 AM 0.3 mL 11/21/2019 Intramuscular   Manufacturer: [employer], [employer]   Lot: [id number]   NDC: [address]-8

## 2020-02-17 ENCOUNTER — Ambulatory Visit: Payer: Medicare HMO | Attending: Internal Medicine

## 2020-02-17 DIAGNOSIS — Z23 Encounter for immunization: Secondary | ICD-10-CM | POA: Insufficient documentation

## 2020-02-17 NOTE — Progress Notes (Signed)
   Covid-19 Vaccination Clinic  Name:  JOURDEN DELMONT    MRN: [id number] DOB: 1936-02-06  02/17/2020  Ms. Trigueros was observed post Covid-19 immunization for 15 minutes without incident. She was provided with Vaccine Information Sheet and instruction to access the V-Safe system.   Ms. Gass was instructed to call 911 with any severe reactions post vaccine: Marland Kitchen Difficulty breathing  . Swelling of face and throat  . A fast heartbeat  . A bad rash all over body  . Dizziness and weakness   Immunizations Administered    Name Date Dose VIS Date Route   Pfizer COVID-19 Vaccine 02/17/2020 10:53 AM 0.3 mL 11/21/2019 Intramuscular   Manufacturer: [employer], [employer]   Lot: [id number]   NDC: [address]-9

## 2020-03-17 ENCOUNTER — Ambulatory Visit: Payer: Self-pay | Admitting: Family Medicine

## 2020-03-17 NOTE — Progress Notes (Signed)
Subjective:   Shakeera AMERIAH LINT is a 84 y.o. female who presents for Medicare Annual (Subsequent) preventive examination.    This visit is being conducted through telemedicine due to the COVID-19 pandemic. This patient has given me verbal consent via doximity to conduct this visit, patient states they are participating from their home address. Some vital signs may be absent or patient reported.    Patient identification: identified by name, DOB, and current address  Review of Systems:  N/A  Cardiac Risk Factors include: advanced age (>48men, >63 women);dyslipidemia;diabetes mellitus;hypertension     Objective:     Vitals: There were no vitals taken for this visit.  There is no height or weight on file to calculate BMI. Unable to obtain vitals due to visit being conducted via telephonically.   Advanced Directives 03/18/2020 04/30/2018 04/11/2018 04/11/2018 04/09/2018 04/09/2018 04/02/2018  Does Patient Have a Medical Advance Directive? No No No Yes Yes Yes No  Type of Advance Directive - - - - - - -  Does patient want to make changes to medical advance directive? - No - Patient declined No - Patient declined No - Patient declined - - -  Would patient like information on creating a medical advance directive? No - Patient declined No - Patient declined No - Patient declined No - Patient declined No - Patient declined No - Patient declined No - Patient declined    Tobacco Social History   Tobacco Use  Smoking Status Never Smoker  Smokeless Tobacco Never Used     Counseling given: Not Answered   Clinical Intake:  Pre-visit preparation completed: Yes  Pain : No/denies pain Pain Score: 0-No pain     Nutritional Risks: None Diabetes: Yes  How often do you need to have someone help you when you read instructions, pamphlets, or other written materials from your doctor or pharmacy?: 1 - Never   Diabetes:  Is the patient diabetic?  Yes  If diabetic, was a CBG obtained today?  No   Did the patient bring in their glucometer from home?  No  How often do you monitor your CBG's? Does not check at home.   Financial Strains and Diabetes Management:  Are you having any financial strains with the device, your supplies or your medication? N/A. Does the patient want to be seen by Chronic Care Management for management of their diabetes?  No  Would the patient like to be referred to a Nutritionist or for Diabetic Management?  No   Diabetic Exams:  Diabetic Eye Exam: Currently due. Overdue for diabetic eye exam. Pt has been advised about the importance in completing this exam. Declines scheduling an apt due to no current issues.  Diabetic Foot Exam: Currently due. Pt has been advised about the importance in completing this exam. Note made to follow up on this at next in office apt.    Interpreter Needed?: No  Information entered by :: [facility], LPN  Past Medical History:  Diagnosis Date  . Gout   . History of chicken pox   . Hyperlipidemia   . Hypertension    Past Surgical History:  Procedure Laterality Date  . Bone Density Study  01/08/2011   T-1.2, L-Spine, T-1.9 Osteopenia  . CARPAL TUNNEL RELEASE Right   . CATARACT EXTRACTION, BILATERAL    . Left hip skin cancer resection  2010   Dermatology  . ORIF ANKLE FRACTURE Right 04/11/2018   Procedure: OPEN REDUCTION INTERNAL FIXATION (ORIF) ANKLE FRACTURE;  Surgeon: Hessie Knows, MD;  Location: [facility] ORS;  Service: Orthopedics;  Laterality: Right;  . TUBAL LIGATION     Family History  Problem Relation Age of Onset  . AAA (abdominal aortic aneurysm) Sister   . Healthy Mother   . Healthy Father   . Gout Other    Social History   Socioeconomic History  . Marital status: Widowed    Spouse name: Not on file  . Number of children: 2  . Years of education: Not on file  . Highest education level: 12th grade  Occupational History  . Occupation: retired  Tobacco Use  . Smoking status: Never Smoker  . Smokeless  tobacco: Never Used  Substance and Sexual Activity  . Alcohol use: No  . Drug use: No  . Sexual activity: Not on file  Other Topics Concern  . Not on file  Social History Narrative  . Not on file   Social Determinants of Health   Financial Resource Strain: Low Risk   . Difficulty of Paying Living Expenses: Not hard at all  Food Insecurity: No Food Insecurity  . Worried About Programme researcher, broadcasting/film/video in the Last Year: Never true  . Ran Out of Food in the Last Year: Never true  Transportation Needs: No Transportation Needs  . Lack of Transportation (Medical): No  . Lack of Transportation (Non-Medical): No  Physical Activity: Insufficiently Active  . Days of Exercise per Week: 6 days  . Minutes of Exercise per Session: 10 min  Stress: No Stress Concern Present  . Feeling of Stress : Not at all  Social Connections: Moderately Isolated  . Frequency of Communication with Friends and Family: More than three times a week  . Frequency of Social Gatherings with Friends and Family: More than three times a week  . Attends Religious Services: Never  . Active Member of Clubs or Organizations: No  . Attends Banker Meetings: Never  . Marital Status: Widowed    Outpatient Encounter Medications as of 03/18/2020  Medication Sig  . acetaminophen (TYLENOL) 500 MG tablet Take 1,000 mg by mouth 2 (two) times daily as needed (for pain.).  Marland Kitchen allopurinol (ZYLOPRIM) 300 MG tablet TAKE 1 TABLET EVERY DAY  . Ascorbic Acid (VITAMIN C) 1000 MG tablet Take 1,000 mg by mouth daily.  Marland Kitchen aspirin EC 81 MG tablet Take 81 mg by mouth daily.  Marland Kitchen atorvastatin (LIPITOR) 40 MG tablet Take 1 tablet (40 mg total) by mouth every evening.  . calcium-vitamin D (OSCAL 500/200 D-3) 500-200 MG-UNIT per tablet Take 1 tablet by mouth daily with breakfast.   . colchicine 0.6 MG tablet 2 tablets at first sign of gout, then one daily as needed  . gabapentin (NEURONTIN) 100 MG capsule TAKE 1 CAPSULE TWICE DAILY (Patient  taking differently: Take 100 mg by mouth at bedtime. )  . HYDROcodone-acetaminophen (NORCO/VICODIN) 5-325 MG tablet Take 1-2 tablets by mouth every 4 (four) hours as needed for moderate pain.  . indomethacin (INDOCIN) 25 MG capsule Take 1-2 capsules (25-50 mg total) by mouth 2 (two) times daily with a meal. For gout (Patient taking differently: Take 25-50 mg by mouth 2 (two) times daily as needed (for gout flare ups). )  . metoprolol succinate (TOPROL-XL) 50 MG 24 hr tablet Take 1 tablet (50 mg total) by mouth daily. For high blood pressure  . Multiple Vitamin (MULTIVITAMIN WITH MINERALS) TABS tablet Take 1 tablet by mouth daily.  . Omega-3 Fatty Acids (FISH OIL) 1000 MG CAPS Take 1,000 mg by mouth  daily.  . triamterene-hydrochlorothiazide (MAXZIDE) 75-50 MG tablet TAKE 1 TABLET EVERY DAY  . Vitamin E 400 UNITS TABS Take 400 Units by mouth daily.   . Vitamins/Minerals TABS Take 1 tablet by mouth daily.    No facility-administered encounter medications on file as of 03/18/2020.    Activities of Daily Living In your present state of health, do you have any difficulty performing the following activities: 03/18/2020  Hearing? N  Vision? N  Difficulty concentrating or making decisions? N  Walking or climbing stairs? Y  Comment Due to balance issues.  Dressing or bathing? N  Doing errands, shopping? N  Preparing Food and eating ? Y  Comment Daughter does the cooking.  Using the Toilet? N  In the past six months, have you accidently leaked urine? N  Do you have problems with loss of bowel control? Y  Comment Occasionally with urges.  Managing your Medications? N  Managing your Finances? N  Housekeeping or managing your Housekeeping? N  Some recent data might be hidden    Patient Care Team: [name], MD as PCP - General (Family Medicine)    Assessment:   This is a routine wellness examination for Starlet.  Exercise Activities and Dietary recommendations Current Exercise Habits:  Home exercise routine, Type of exercise: walking, Time (Minutes): 15, Frequency (Times/Week): 6, Weekly Exercise (Minutes/Week): 90, Intensity: Mild, Exercise limited by: None identified  Goals    . DIET - INCREASE WATER INTAKE     Recommend increasing water intake to 4 glasses a day.        Fall Risk: Fall Risk  03/18/2020 07/26/2018 01/15/2018 01/05/2017 09/19/2016  Falls in the past year? 0 Yes No No No  Number falls in past yr: 0 2 or more - - -  Injury with Fall? 0 Yes - - -  Follow up - Falls prevention discussed - - -    FALL RISK PREVENTION PERTAINING TO THE HOME:  Any stairs in or around the home? No  If so, are there any without handrails? N/A  Home free of loose throw rugs in walkways, pet beds, electrical cords, etc? Yes  Adequate lighting in your home to reduce risk of falls? Yes   ASSISTIVE DEVICES UTILIZED TO PREVENT FALLS:  Life alert? Yes  Use of a cane, walker or w/c? Yes  Grab bars in the bathroom? Yes  Shower chair or bench in shower? Yes  Elevated toilet seat or a handicapped toilet? Yes   TIMED UP AND GO:  Was the test performed? No .    Depression Screen PHQ 2/9 Scores 03/18/2020 07/26/2018 01/15/2018 01/15/2018  PHQ - 2 Score 0 0 0 0  PHQ- 9 Score - 0 0 -     Cognitive Function: Declined today.     6CIT Screen 01/05/2017  What Year? 0 points  What month? 0 points  What time? 0 points  Count back from 20 0 points  Months in reverse 4 points  Repeat phrase 2 points  Total Score 6    Immunization History  Administered Date(s) Administered  . Fluad Quad(high Dose 65+) 09/17/2019  . Influenza, High Dose Seasonal PF 09/30/2015, 09/19/2016, 09/11/2017, 09/24/2018  . PFIZER SARS-COV-2 Vaccination 01/23/2020, 02/17/2020  . Pneumococcal Conjugate-13 09/02/2014  . Pneumococcal Polysaccharide-23 01/05/2017  . Tdap 08/28/2012  . Zoster 08/28/2012    Qualifies for Shingles Vaccine? Yes  Zostavax completed 08/28/12. Due for Shingrix. Pt has been advised  to call insurance company to determine out of pocket  expense. Advised may also receive vaccine at local pharmacy or Health Dept. Verbalized acceptance and understanding.  Tdap: Up to date  Flu Vaccine: Up to date  Pneumococcal Vaccine: Completed series  Screening Tests Health Maintenance  Topic Date Due  . FOOT EXAM  Never done  . OPHTHALMOLOGY EXAM  Never done  . URINE MICROALBUMIN  01/15/2019  . HEMOGLOBIN A1C  03/17/2020  . INFLUENZA VACCINE  07/11/2020  . TETANUS/TDAP  08/28/2022  . DEXA SCAN  10/06/2022  . PNA vac Low Risk Adult  Completed    Cancer Screenings:  Colorectal Screening: No longer required.   Mammogram: No longer required.   Bone Density: Completed 10/07/19. Results reflect OSTEOPENIA. Repeat every 3 years.   Lung Cancer Screening: (Low Dose CT Chest recommended if Age 40-80 years, 30 pack-year currently smoking OR have quit w/in 15years.) does not qualify.   Additional Screening:  Dental Screening: Recommended annual dental exams for proper oral hygiene   Community Resource Referral:  CRR required this visit?  No       Plan:  I have personally reviewed and addressed the Medicare Annual Wellness questionnaire and have noted the following in the patient's chart:  A. Medical and social history B. Use of alcohol, tobacco or illicit drugs  C. Current medications and supplements D. Functional ability and status E.  Nutritional status F.  Physical activity G. Advance directives H. List of other physicians I.  Hospitalizations, surgeries, and ER visits in previous 12 months J.  Vitals K. Screenings such as hearing and vision if needed, cognitive and depression L. Referrals and appointments   In addition, I have reviewed and discussed with patient certain preventive protocols, quality metrics, and best practice recommendations. A written personalized care plan for preventive services as well as general preventive health recommendations were provided  to patient.   Darrick Huntsman, [place]  01/11/3085 Nurse Health Advisor   Nurse Notes: Pt needs a diabetic foot exam, Hgb A1c check and urine check at next in office apt. Pt declined scheduling an eye exam for this year.

## 2020-03-18 ENCOUNTER — Ambulatory Visit (INDEPENDENT_AMBULATORY_CARE_PROVIDER_SITE_OTHER): Payer: Medicare HMO

## 2020-03-18 ENCOUNTER — Other Ambulatory Visit: Payer: Self-pay

## 2020-03-18 DIAGNOSIS — Z0000 Encounter for general adult medical examination without abnormal findings: Secondary | ICD-10-CM

## 2020-03-18 NOTE — Patient Instructions (Signed)
Teresa Shields , Thank you for taking time to come for your Medicare Wellness Visit. I appreciate your ongoing commitment to your health goals. Please review the following plan we discussed and let me know if I can assist you in the future.   Screening recommendations/referrals: Colonoscopy: No longer required.  Mammogram: No longer required.  Bone Density: Up to date, due 09/2022 Recommended yearly ophthalmology/optometry visit for glaucoma screening and checkup Recommended yearly dental visit for hygiene and checkup  Vaccinations: Influenza vaccine: Up to date Pneumococcal vaccine: Completed series Tdap vaccine: Up to date Shingles vaccine: Pt declines today.     Advanced directives: Advance directive discussed with you today. Even though you declined this today please call our office should you change your mind and we can give you the proper paperwork for you to fill out.  Conditions/risks identified: Recommend to increase water intake to 6-8 8 oz glasses a day.  Next appointment: 03/22/20 @ 8:00 AM with Dr Sherrie Mustache. Declined scheduling an AWV for 2022 at this time.    Preventive Care 2 Years and Older, Female Preventive care refers to lifestyle choices and visits with your health care provider that can promote health and wellness. What does preventive care include?  A yearly physical exam. This is also called an annual well check.  Dental exams once or twice a year.  Routine eye exams. Ask your health care provider how often you should have your eyes checked.  Personal lifestyle choices, including:  Daily care of your teeth and gums.  Regular physical activity.  Eating a healthy diet.  Avoiding tobacco and drug use.  Limiting alcohol use.  Practicing safe sex.  Taking low-dose aspirin every day.  Taking vitamin and mineral supplements as recommended by your health care provider. What happens during an annual well check? The services and screenings done by your health  care provider during your annual well check will depend on your age, overall health, lifestyle risk factors, and family history of disease. Counseling  Your health care provider may ask you questions about your:  Alcohol use.  Tobacco use.  Drug use.  Emotional well-being.  Home and relationship well-being.  Sexual activity.  Eating habits.  History of falls.  Memory and ability to understand (cognition).  Work and work Astronomer.  Reproductive health. Screening  You may have the following tests or measurements:  Height, weight, and BMI.  Blood pressure.  Lipid and cholesterol levels. These may be checked every 5 years, or more frequently if you are over 77 years old.  Skin check.  Lung cancer screening. You may have this screening every year starting at age 87 if you have a 30-pack-year history of smoking and currently smoke or have quit within the past 15 years.  Fecal occult blood test (FOBT) of the stool. You may have this test every year starting at age 29.  Flexible sigmoidoscopy or colonoscopy. You may have a sigmoidoscopy every 5 years or a colonoscopy every 10 years starting at age 53.  Hepatitis C blood test.  Hepatitis B blood test.  Sexually transmitted disease (STD) testing.  Diabetes screening. This is done by checking your blood sugar (glucose) after you have not eaten for a while (fasting). You may have this done every 1-3 years.  Bone density scan. This is done to screen for osteoporosis. You may have this done starting at age 35.  Mammogram. This may be done every 1-2 years. Talk to your health care provider about how often you should  have regular mammograms. Talk with your health care provider about your test results, treatment options, and if necessary, the need for more tests. Vaccines  Your health care provider may recommend certain vaccines, such as:  Influenza vaccine. This is recommended every year.  Tetanus, diphtheria, and  acellular pertussis (Tdap, Td) vaccine. You may need a Td booster every 10 years.  Zoster vaccine. You may need this after age 77.  Pneumococcal 13-valent conjugate (PCV13) vaccine. One dose is recommended after age 25.  Pneumococcal polysaccharide (PPSV23) vaccine. One dose is recommended after age 42. Talk to your health care provider about which screenings and vaccines you need and how often you need them. This information is not intended to replace advice given to you by your health care provider. Make sure you discuss any questions you have with your health care provider. Document Released: 12/24/2015 Document Revised: 08/16/2016 Document Reviewed: 09/28/2015 Elsevier Interactive Patient Education  2017 [employer] Prevention in the Home Falls can cause injuries. They can happen to people of all ages. There are many things you can do to make your home safe and to help prevent falls. What can I do on the outside of my home?  Regularly fix the edges of walkways and driveways and fix any cracks.  Remove anything that might make you trip as you walk through a door, such as a raised step or threshold.  Trim any bushes or trees on the path to your home.  Use bright outdoor lighting.  Clear any walking paths of anything that might make someone trip, such as rocks or tools.  Regularly check to see if handrails are loose or broken. Make sure that both sides of any steps have handrails.  Any raised decks and porches should have guardrails on the edges.  Have any leaves, snow, or ice cleared regularly.  Use sand or salt on walking paths during winter.  Clean up any spills in your garage right away. This includes oil or grease spills. What can I do in the bathroom?  Use night lights.  Install grab bars by the toilet and in the tub and shower. Do not use towel bars as grab bars.  Use non-skid mats or decals in the tub or shower.  If you need to sit down in the shower, use  a plastic, non-slip stool.  Keep the floor dry. Clean up any water that spills on the floor as soon as it happens.  Remove soap buildup in the tub or shower regularly.  Attach bath mats securely with double-sided non-slip rug tape.  Do not have throw rugs and other things on the floor that can make you trip. What can I do in the bedroom?  Use night lights.  Make sure that you have a light by your bed that is easy to reach.  Do not use any sheets or blankets that are too big for your bed. They should not hang down onto the floor.  Have a firm chair that has side arms. You can use this for support while you get dressed.  Do not have throw rugs and other things on the floor that can make you trip. What can I do in the kitchen?  Clean up any spills right away.  Avoid walking on wet floors.  Keep items that you use a lot in easy-to-reach places.  If you need to reach something above you, use a strong step stool that has a grab bar.  Keep electrical cords out of  the way.  Do not use floor polish or wax that makes floors slippery. If you must use wax, use non-skid floor wax.  Do not have throw rugs and other things on the floor that can make you trip. What can I do with my stairs?  Do not leave any items on the stairs.  Make sure that there are handrails on both sides of the stairs and use them. Fix handrails that are broken or loose. Make sure that handrails are as long as the stairways.  Check any carpeting to make sure that it is firmly attached to the stairs. Fix any carpet that is loose or worn.  Avoid having throw rugs at the top or bottom of the stairs. If you do have throw rugs, attach them to the floor with carpet tape.  Make sure that you have a light switch at the top of the stairs and the bottom of the stairs. If you do not have them, ask someone to add them for you. What else can I do to help prevent falls?  Wear shoes that:  Do not have high heels.  Have  rubber bottoms.  Are comfortable and fit you well.  Are closed at the toe. Do not wear sandals.  If you use a stepladder:  Make sure that it is fully opened. Do not climb a closed stepladder.  Make sure that both sides of the stepladder are locked into place.  Ask someone to hold it for you, if possible.  Clearly mark and make sure that you can see:  Any grab bars or handrails.  First and last steps.  Where the edge of each step is.  Use tools that help you move around (mobility aids) if they are needed. These include:  Canes.  Walkers.  Scooters.  Crutches.  Turn on the lights when you go into a dark area. Replace any light bulbs as soon as they burn out.  Set up your furniture so you have a clear path. Avoid moving your furniture around.  If any of your floors are uneven, fix them.  If there are any pets around you, be aware of where they are.  Review your medicines with your doctor. Some medicines can make you feel dizzy. This can increase your chance of falling. Ask your doctor what other things that you can do to help prevent falls. This information is not intended to replace advice given to you by your health care provider. Make sure you discuss any questions you have with your health care provider. Document Released: 09/23/2009 Document Revised: 05/04/2016 Document Reviewed: 01/01/2015 Elsevier Interactive Patient Education  2017 [employer].

## 2020-03-22 ENCOUNTER — Ambulatory Visit (INDEPENDENT_AMBULATORY_CARE_PROVIDER_SITE_OTHER): Payer: Medicare HMO | Admitting: Family Medicine

## 2020-03-22 ENCOUNTER — Ambulatory Visit: Payer: Medicare HMO

## 2020-03-22 ENCOUNTER — Encounter: Payer: Self-pay | Admitting: Family Medicine

## 2020-03-22 VITALS — BP 126/62 | HR 73 | Temp 97.3°F | Resp 16 | Wt 163.0 lb

## 2020-03-22 DIAGNOSIS — N1831 Chronic kidney disease, stage 3a: Secondary | ICD-10-CM | POA: Diagnosis not present

## 2020-03-22 DIAGNOSIS — E1121 Type 2 diabetes mellitus with diabetic nephropathy: Secondary | ICD-10-CM

## 2020-03-22 DIAGNOSIS — G3281 Cerebellar ataxia in diseases classified elsewhere: Secondary | ICD-10-CM | POA: Diagnosis not present

## 2020-03-22 DIAGNOSIS — E782 Mixed hyperlipidemia: Secondary | ICD-10-CM | POA: Diagnosis not present

## 2020-03-22 DIAGNOSIS — N183 Chronic kidney disease, stage 3 unspecified: Secondary | ICD-10-CM | POA: Insufficient documentation

## 2020-03-22 DIAGNOSIS — E1140 Type 2 diabetes mellitus with diabetic neuropathy, unspecified: Secondary | ICD-10-CM

## 2020-03-22 DIAGNOSIS — N1832 Chronic kidney disease, stage 3b: Secondary | ICD-10-CM | POA: Insufficient documentation

## 2020-03-22 LAB — POCT UA - MICROALBUMIN: Microalbumin Ur, POC: 50 mg/L

## 2020-03-22 LAB — POCT GLYCOSYLATED HEMOGLOBIN (HGB A1C)
Est. average glucose Bld gHb Est-mCnc: 169
Hemoglobin A1C: 7.5 % — AB (ref 4.0–5.6)

## 2020-03-22 NOTE — Progress Notes (Signed)
Established patient visit      Patient: Teresa Shields   DOB: [birth year]   84 y.o. Female  MRN: [id number] Visit Date: 03/22/2020  Today's healthcare provider: Lelon Huh, MD  Subjective:    Chief Complaint  Patient presents with  . Diabetes  . Hyperlipidemia   HPI  Diabetes Mellitus Type II, Follow-up:   Lab Results  Component Value Date   HGBA1C 7.5 (A) 03/22/2020   HGBA1C 6.8 (A) 09/17/2019   HGBA1C 6.8 (A) 07/26/2018    Last seen for diabetes 6 months ago.  Management since then includes no changes; diet controlled. She reports good compliance with treatment. She is not having side effects.  Current symptoms include none and have been stable. Home blood sugar records: blood sugars are not checked at home  Episodes of hypoglycemia? no   Current insulin regiment: Is not on insulin Most Recent Eye Exam: not UTD Weight trend: fluctuating a bit Prior visit with dietician: No Current exercise: walking Current diet habits: well balanced  Pertinent Labs:    Component Value Date/Time   CHOL 165 09/17/2019 0933   TRIG 527 (H) 09/17/2019 0933   HDL 35 (L) 09/17/2019 0933   LDLCALC 52 09/17/2019 0933   CREATININE 1.09 (H) 09/17/2019 0933    Wt Readings from Last 3 Encounters:  03/22/20 163 lb (73.9 kg)  09/17/19 162 lb 12.8 oz (73.8 kg)  07/26/18 161 lb (73 kg)    ------------------------------------------------------------------------  Lipid/Cholesterol, Follow-up:   Last seen for this 6 months ago.  Management changes since that visit include recommend taking OTC fish oil capsule 1000mg  twice a day. . Last Lipid Panel:    Component Value Date/Time   CHOL 165 09/17/2019 0933   TRIG 527 (H) 09/17/2019 0933   HDL 35 (L) 09/17/2019 0933   CHOLHDL 4.7 (H) 09/17/2019 0933   LDLCALC 52 09/17/2019 0933    Risk factors for vascular disease include diabetes mellitus and hypercholesterolemia  She reports good compliance with treatment. She is not  having side effects.  Current symptoms include none and have been stable. Weight trend: stable Prior visit with dietician: no Current diet: well balanced Current exercise: walking  Wt Readings from Last 3 Encounters:  03/22/20 163 lb (73.9 kg)  09/17/19 162 lb 12.8 oz (73.8 kg)  07/26/18 161 lb (73 kg)    -------------------------------------------------------------------  Hypertension, follow-up:  BP Readings from Last 3 Encounters:  03/22/20 126/62  09/17/19 (!) 145/75  07/26/18 (!) 146/74    She was last seen for hypertension 1 year ago.  BP at that visit was 136/80. Management since that visit includes none. She reports good compliance with treatment. She is not having side effects.  She is exercising. She is adherent to low salt diet.   Outside blood pressures are 137/83. She is experiencing none.  Patient denies chest pain, chest pressure/discomfort, claudication, dyspnea, exertional chest pressure/discomfort, fatigue, irregular heart beat, lower extremity edema, near-syncope, orthopnea, palpitations, paroxysmal nocturnal dyspnea, syncope and tachypnea.   Cardiovascular risk factors include advanced age (older than 68 for men, 57 for women), diabetes mellitus, dyslipidemia and hypertension.  Use of agents associated with hypertension: NSAIDS.     Weight trend: fluctuating a bit Wt Readings from Last 3 Encounters:  03/22/20 163 lb (73.9 kg)  09/17/19 162 lb 12.8 oz (73.8 kg)  07/26/18 161 lb (73 kg)    Current diet: well balanced  ------------------------------------------------------------------------ She also reports she continues to have trouble dropping things with both hands  and feels unsteady on her feet, having to use a walker to keep from falling.   She was referred to neurology in 2019, diagnosed with peripheral neuropathy and CTS. She has since had CTS surgery, but continues to have trouble with grip strength. She was also prescribed gabapentin which  she did not tolerate.  She had MRI of brain ordered in October, but she did not go through with it because she was worried about exposure to contrast media.   Past Surgical History:  Procedure Laterality Date  . Bone Density Study  01/08/2011   T-1.2, L-Spine, T-1.9 Osteopenia  . CARPAL TUNNEL RELEASE Right   . CATARACT EXTRACTION, BILATERAL    . Left hip skin cancer resection  2010   Dermatology  . ORIF ANKLE FRACTURE Right 04/11/2018   Procedure: OPEN REDUCTION INTERNAL FIXATION (ORIF) ANKLE FRACTURE;  Surgeon: Kennedy Bucker, MD;  Location: [facility] ORS;  Service: Orthopedics;  Laterality: Right;  . TUBAL LIGATION         Medications: Outpatient Medications Prior to Visit  Medication Sig  . acetaminophen (TYLENOL) 500 MG tablet Take 1,000 mg by mouth 2 (two) times daily as needed (for pain.).  Marland Kitchen allopurinol (ZYLOPRIM) 300 MG tablet TAKE 1 TABLET EVERY DAY  . Ascorbic Acid (VITAMIN C) 1000 MG tablet Take 1,000 mg by mouth daily.  Marland Kitchen aspirin EC 81 MG tablet Take 81 mg by mouth daily.  Marland Kitchen atorvastatin (LIPITOR) 40 MG tablet Take 1 tablet (40 mg total) by mouth every evening.  . calcium-vitamin D (OSCAL 500/200 D-3) 500-200 MG-UNIT per tablet Take 1 tablet by mouth daily with breakfast.   . colchicine 0.6 MG tablet 2 tablets at first sign of gout, then one daily as needed  . gabapentin (NEURONTIN) 100 MG capsule TAKE 1 CAPSULE TWICE DAILY (Patient taking differently: Take 100 mg by mouth at bedtime. )  . HYDROcodone-acetaminophen (NORCO/VICODIN) 5-325 MG tablet Take 1-2 tablets by mouth every 4 (four) hours as needed for moderate pain.  . indomethacin (INDOCIN) 25 MG capsule Take 1-2 capsules (25-50 mg total) by mouth 2 (two) times daily with a meal. For gout (Patient taking differently: Take 25-50 mg by mouth 2 (two) times daily as needed (for gout flare ups). )  . metoprolol succinate (TOPROL-XL) 50 MG 24 hr tablet Take 1 tablet (50 mg total) by mouth daily. For high blood pressure  . Multiple  Vitamin (MULTIVITAMIN WITH MINERALS) TABS tablet Take 1 tablet by mouth daily.  . Omega-3 Fatty Acids (FISH OIL) 1000 MG CAPS Take 1,000 mg by mouth daily.  Marland Kitchen triamterene-hydrochlorothiazide (MAXZIDE) 75-50 MG tablet TAKE 1 TABLET EVERY DAY  . Vitamin E 400 UNITS TABS Take 400 Units by mouth daily.   . Vitamins/Minerals TABS Take 1 tablet by mouth daily.    No facility-administered medications prior to visit.    Review of Systems  Constitutional: Negative for appetite change, chills, fatigue and fever.  Respiratory: Negative for chest tightness and shortness of breath.   Cardiovascular: Negative for chest pain and palpitations.  Gastrointestinal: Negative for abdominal pain, nausea and vomiting.  Musculoskeletal: Positive for gait problem (off balance).  Neurological: Negative for dizziness and weakness (in hands; unable to grip items).    Last vitamin D Lab Results  Component Value Date   VD25OH 37.0 01/21/2018   Last vitamin B12 and Folate Lab Results  Component Value Date   VITAMINB12 >2000 (H) 09/17/2019        Objective:    BP 126/62 (BP Location:  Left Arm, Patient Position: Sitting, Cuff Size: Large)   Pulse 73   Temp (!) 97.3 F (36.3 C) (Temporal)   Resp 16   Wt 163 lb (73.9 kg)   SpO2 97% Comment: room air  BMI 26.31 kg/m     Physical Exam   General: Appearance:     Overweight female in no acute distress  Eyes:    PERRL, conjunctiva/corneas clear, EOM's intact       Lungs:     Clear to auscultation bilaterally, respirations unlabored  Heart:    Normal heart rate. Normal rhythm. No murmurs, rubs, or gallops.   MS:   All extremities are intact.   Neurologic:   Awake, alert, oriented x 3. No apparent focal neurological           defect.        Results for orders placed or performed in visit on 03/22/20  POCT HgB A1C  Result Value Ref Range   Hemoglobin A1C 7.5 (A) 4.0 - 5.6 %   Est. average glucose Bld gHb Est-mCnc 169   POCT UA - Microalbumin  Result  Value Ref Range   Microalbumin Ur, POC 50 mg/L      Assessment & Plan:    1. Type 2 diabetes mellitus with diabetic neuropathy, without long-term current use of insulin (HCC) A1c is up, but she reports she has not been following diabetic diet very well. Will reassess in 3 months and start metformin if not improving.  - Renal function panel  2. Cerebellar ataxia in diseases classified elsewhere [facility]) Has been present for a few year and slowly getting worse. She does not want to return to neurology due to having adverse reaction to gabapentin, but she will proceed with MRI previously ordered, but without contrast media per her request.  - MR Brain Wo Contrast; Future  3. Hyperlipidemia, mixed Doing well with addition of fish oil to atorvastatin since last visit.  - Lipid panel  4. Stage 3a chronic kidney disease  - Renal function panel      Mila Merry, MD  [facility] [phone] (phone) [phone] (fax)  [facility]

## 2020-03-23 LAB — RENAL FUNCTION PANEL
Albumin: 4.3 g/dL (ref 3.6–4.6)
BUN/Creatinine Ratio: 21 (ref 12–28)
BUN: 27 mg/dL (ref 8–27)
CO2: 22 mmol/L (ref 20–29)
Calcium: 10 mg/dL (ref 8.7–10.3)
Chloride: 98 mmol/L (ref 96–106)
Creatinine, Ser: 1.27 mg/dL — ABNORMAL HIGH (ref 0.57–1.00)
GFR calc Af Amer: 46 mL/min/{1.73_m2} — ABNORMAL LOW (ref >59)
GFR calc non Af Amer: 40 mL/min/{1.73_m2} — ABNORMAL LOW (ref >59)
Glucose: 189 mg/dL — ABNORMAL HIGH (ref 65–99)
Phosphorus: 2.9 mg/dL — ABNORMAL LOW (ref 3.0–4.3)
Potassium: 3.9 mmol/L (ref 3.5–5.2)
Sodium: 141 mmol/L (ref 134–144)

## 2020-03-23 LAB — LIPID PANEL
Chol/HDL Ratio: 4.3 ratio (ref 0.0–4.4)
Cholesterol, Total: 146 mg/dL (ref 100–199)
HDL: 34 mg/dL — ABNORMAL LOW (ref >39)
LDL Chol Calc (NIH): 52 mg/dL (ref 0–99)
Triglycerides: 398 mg/dL — ABNORMAL HIGH (ref 0–149)
VLDL Cholesterol Cal: 60 mg/dL — ABNORMAL HIGH (ref 5–40)

## 2020-04-04 ENCOUNTER — Other Ambulatory Visit: Payer: Self-pay

## 2020-04-04 ENCOUNTER — Ambulatory Visit
Admission: RE | Admit: 2020-04-04 | Discharge: 2020-04-04 | Disposition: A | Discharge status: Home / Self Care | Payer: Medicare HMO | Source: Ambulatory Visit | Attending: Family Medicine | Admitting: Family Medicine

## 2020-04-04 DIAGNOSIS — G3281 Cerebellar ataxia in diseases classified elsewhere: Secondary | ICD-10-CM | POA: Insufficient documentation

## 2020-04-04 DIAGNOSIS — R200 Anesthesia of skin: Secondary | ICD-10-CM | POA: Diagnosis not present

## 2020-04-05 ENCOUNTER — Other Ambulatory Visit: Payer: Self-pay

## 2020-04-05 DIAGNOSIS — R269 Unspecified abnormalities of gait and mobility: Secondary | ICD-10-CM

## 2020-04-06 ENCOUNTER — Other Ambulatory Visit: Payer: Medicare HMO

## 2020-04-16 ENCOUNTER — Other Ambulatory Visit: Payer: Self-pay | Admitting: Family Medicine

## 2020-04-16 NOTE — Telephone Encounter (Signed)
Requested medication (s) are due for refill today: Yes  Requested medication (s) are on the active medication list: Yes  Last refill:  03/08/19  Future visit scheduled: Yes  Notes to clinic:  Prescription has expired.    Requested Prescriptions  Pending Prescriptions Disp Refills   allopurinol (ZYLOPRIM) 300 MG tablet [Pharmacy Med Name: ALLOPURINOL 300 MG Tablet] 90 tablet 4    Sig: TAKE 1 TABLET EVERY DAY      Endocrinology:  Gout Agents Failed - 04/16/2020  1:28 PM      Failed - Uric Acid in normal range and within 360 days    Uric Acid  Date Value Ref Range Status  01/12/2017 3.7 2.5 - 7.1 mg/dL Final    Comment:               Therapeutic target for gout patients: <6.0          Failed - Cr in normal range and within 360 days    Creatinine, Ser  Date Value Ref Range Status  03/22/2020 1.27 (H) 0.57 - 1.00 mg/dL Final          Passed - Valid encounter within last 12 months    Recent Outpatient Visits           3 weeks ago Type 2 diabetes mellitus with diabetic neuropathy, without long-term current use of insulin (HCC)   [facility] [name], MD   7 months ago Need for influenza vaccination   [facility] [name], MD   1 year ago Type 2 diabetes mellitus with diabetic neuropathy, without long-term current use of insulin [facility])   [facility] [name], MD   1 year ago Hypokalemia   [facility] [name], MD   2 years ago Annual physical exam   [facility] [name], MD       Future Appointments             In 2 months [name], [name], MD [facility], [facility]

## 2020-04-19 DIAGNOSIS — R202 Paresthesia of skin: Secondary | ICD-10-CM | POA: Diagnosis not present

## 2020-04-19 DIAGNOSIS — E1342 Other specified diabetes mellitus with diabetic polyneuropathy: Secondary | ICD-10-CM | POA: Diagnosis not present

## 2020-04-19 DIAGNOSIS — R29818 Other symptoms and signs involving the nervous system: Secondary | ICD-10-CM | POA: Diagnosis not present

## 2020-04-19 DIAGNOSIS — G5603 Carpal tunnel syndrome, bilateral upper limbs: Secondary | ICD-10-CM | POA: Diagnosis not present

## 2020-04-23 ENCOUNTER — Other Ambulatory Visit: Payer: Self-pay | Admitting: Family Medicine

## 2020-04-27 ENCOUNTER — Telehealth: Payer: Self-pay | Admitting: Family Medicine

## 2020-04-27 NOTE — Chronic Care Management (AMB) (Signed)
  Chronic Care Management   Note  04/27/2020 Name: Teresa Shields MRN: [id number] DOB: March 15, 1936  Teresa Shields is a 84 y.o. year old female who is a primary care patient of [name], Kirstie Peri, MD. I reached out to [employer] by phone today in response to a referral sent by Teresa Shields's health plan.     Teresa Shields was given information about Chronic Care Management services today including:  1. CCM service includes personalized support from designated clinical staff supervised by her physician, including individualized plan of care and coordination with other care providers 2. 24/7 contact phone numbers for assistance for urgent and routine care needs. 3. Service will only be billed when office clinical staff spend 20 minutes or more in a month to coordinate care. 4. Only one practitioner may furnish and bill the service in a calendar month. 5. The patient may stop CCM services at any time (effective at the end of the month) by phone call to the office staff. 6. The patient will be responsible for cost sharing (co-pay) of up to 20% of the service fee (after annual deductible is met).  Patient agreed to services and verbal consent obtained.   Follow up plan: Telephone appointment with care management team member scheduled for:05/04/2020.  [place], [place] [id number] Direct Dial: [phone] [email or web address]: [email or web address]

## 2020-05-04 ENCOUNTER — Ambulatory Visit (INDEPENDENT_AMBULATORY_CARE_PROVIDER_SITE_OTHER): Payer: Medicare HMO

## 2020-05-04 DIAGNOSIS — I10 Essential (primary) hypertension: Secondary | ICD-10-CM

## 2020-05-04 DIAGNOSIS — R269 Unspecified abnormalities of gait and mobility: Secondary | ICD-10-CM

## 2020-05-04 DIAGNOSIS — E1140 Type 2 diabetes mellitus with diabetic neuropathy, unspecified: Secondary | ICD-10-CM | POA: Diagnosis not present

## 2020-05-04 NOTE — Chronic Care Management (AMB) (Signed)
Chronic Care Management   Initial Visit Note  05/04/2020 Name: Teresa Shields MRN: [id number] DOB: [birth year]  Primary Care Provider: Birdie Sons, MD Reason for referral : Chronic Care Management   Teresa Shields is a 84 y.o. year old female who is a primary care patient of [name], Kirstie Peri, MD. The CCM team was consulted for assistance with chronic disease management and care coordination. A telephonic assessment was conducted today.  Review of Ms. Amero's status, including review of consultants reports, relevant labs and test results was conducted today. Collaboration with appropriate care team members was performed as part of the comprehensive evaluation and provision of chronic care management services.     SDOH (Social Determinants of Health) assessments performed: Yes No further interventions required at this time.    Medications: Outpatient Encounter Medications as of 05/04/2020  Medication Sig  . acetaminophen (TYLENOL) 500 MG tablet Take 1,000 mg by mouth 2 (two) times daily as needed (for pain.).  Marland Kitchen allopurinol (ZYLOPRIM) 300 MG tablet TAKE 1 TABLET EVERY DAY  . Ascorbic Acid (VITAMIN C) 1000 MG tablet Take 1,000 mg by mouth daily.  Marland Kitchen aspirin EC 81 MG tablet Take 81 mg by mouth daily.  Marland Kitchen atorvastatin (LIPITOR) 40 MG tablet TAKE 1 TABLET EVERY EVENING  . colchicine 0.6 MG tablet 2 tablets at first sign of gout, then one daily as needed  . gabapentin (NEURONTIN) 100 MG capsule TAKE 1 CAPSULE TWICE DAILY (Patient taking differently: Take 100 mg by mouth at bedtime. )  . HYDROcodone-acetaminophen (NORCO/VICODIN) 5-325 MG tablet Take 1-2 tablets by mouth every 4 (four) hours as needed for moderate pain.  . indomethacin (INDOCIN) 25 MG capsule Take 1-2 capsules (25-50 mg total) by mouth 2 (two) times daily with a meal. For gout (Patient taking differently: Take 25-50 mg by mouth 2 (two) times daily as needed (for gout flare ups). )  . metoprolol succinate (TOPROL-XL) 50 MG  24 hr tablet Take 1 tablet (50 mg total) by mouth daily. For high blood pressure  . Multiple Vitamin (MULTIVITAMIN WITH MINERALS) TABS tablet Take 1 tablet by mouth daily.  . Omega-3 Fatty Acids (FISH OIL) 1000 MG CAPS Take 1,000 mg by mouth daily.  Marland Kitchen triamterene-hydrochlorothiazide (MAXZIDE) 75-50 MG tablet TAKE 1 TABLET EVERY DAY  . calcium-vitamin D (OSCAL 500/200 D-3) 500-200 MG-UNIT per tablet Take 1 tablet by mouth daily with breakfast.   . Vitamin E 400 UNITS TABS Take 400 Units by mouth daily.   . Vitamins/Minerals TABS Take 1 tablet by mouth daily.    No facility-administered encounter medications on file as of 05/04/2020.     Objective:  BP Readings from Last 3 Encounters:  03/22/20 126/62  09/17/19 (!) 145/75  07/26/18 (!) 146/74    Lab Results  Component Value Date   HGBA1C 7.5 (A) 03/22/2020    Lab Results  Component Value Date   CHOL 146 03/22/2020   HDL 34 (L) 03/22/2020   LDLCALC 52 03/22/2020   TRIG 398 (H) 03/22/2020   CHOLHDL 4.3 03/22/2020   Functional Status Survey: Is the patient deaf or have difficulty hearing?: No Does the patient have difficulty seeing, even when wearing glasses/contacts?: No Does the patient have difficulty concentrating, remembering, or making decisions?: No Does the patient have difficulty walking or climbing stairs?: Yes Does the patient have difficulty dressing or bathing?: No Does the patient have difficulty doing errands alone such as visiting a doctor's office or shopping?: No(Daughter provides transportatoin)    Depression  screen [facility] 2/9 05/04/2020 03/18/2020 07/26/2018  Decreased Interest 0 0 0  Down, Depressed, Hopeless 0 0 0  PHQ - 2 Score 0 0 0  Altered sleeping - - 0  Tired, decreased energy - - 0  Change in appetite - - 0  Feeling bad or failure about yourself  - - 0  Trouble concentrating - - 0  Moving slowly or fidgety/restless - - 0  Suicidal thoughts - - 0  PHQ-9 Score - - 0  Difficult doing work/chores - -  Not difficult at all    Fall Risk  05/04/2020 03/18/2020 07/26/2018 01/15/2018 01/05/2017  Falls in the past year? 0 0 Yes No No  Number falls in past yr: - 0 2 or more - -  Injury with Fall? - 0 Yes - -  Risk for fall due to : Impaired balance/gait - - - -  Follow up Falls prevention discussed - Falls prevention discussed - -    Goals Addressed            This Visit's Progress   . Chronic Disease Managment       CARE PLAN ENTRY (see longitudinal plan of care for additional care plan information)  Current Barriers:  . Chronic Disease Management support and education needs related to Hypertension, Hyperlipidemia and Diabetes.  Nurse Case Manager Clinical Goal(s):  Marland Kitchen Over the next days, patient will attend all scheduled medical appointments:  . Over the next 120 days, patient will take all medications as prescribed. . Over the next 90 days, patient will attend all scheduled medical appointments. . Over the next 90 days, patient will monitor her blood pressure and maintain a log. . Over the next 90 days, patient will adhere to recommended cardiac prudent/diabetic diet. . Over the next 60 days, patient will work with providers and care management team regarding possible need for physical therapy.  Interventions:  . Inter-disciplinary care team collaboration (see longitudinal plan of care) . Reviewed medications and indications for use.  Encouraged to take medications as prescribed and notify provider with concerns regarding prescribed regimen. Encouraged to notify care management team with concerns regarding medication management or prescription costs. . Discussed established blood pressure parameters and indications for notifying provider. Encouraged to monitor and record readings. Reports readings have been within range. Recent reading of 126/84. Marland Kitchen Discussed nutritional intake and compliance with recommended cardiac prudent/diabetic diet.  . Discussed safety precautions, activity  tolerance and ability to perform tasks in the home. Reports using precautions to prevent falls. States daughter Darryll Capers is available to assist as needed. Denies current need for additional assistance in the home. . Reviewed scheduled/pending appointments. Encouraged to attend appointments as scheduled to prevent delays in care. Encouraged to inform team if transportation assistance is required. . Discussed plans for ongoing care management and follow up. Provided direct contact information for care management team.    Patient Self Care Activities:  . Self administers medications  . Attends scheduled provider appointments . Calls pharmacy for medication refills . Performs ADL's independently  Initial goal documentation     . Risk for Falls       CARE PLAN ENTRY (see longitudinal plan of care for additional care plan information)  Current Barriers:  . Care Coordination needs related to impaired balance. (Risk for falls)  Clinical Goal(s):  Marland Kitchen Over the next 120 days, patient will not require hospitalization or emergent care due to fall related injuries. . Over the next 90 days, patient will use assistive device  when ambulating. . Over the next 90 days, patient will follow home safety and fall prevention measures. . Over the next 90 days, patient will keep providers and care management team informed of changes in activity tolerance and ability to ambulate.   Interventions:  . Assessed for falls within the last year. . Reviewed medications and discussed potential side effects. . Provided education regarding home safety and fall prevention. Encouraged to use assistive devices when ambulating. Encouraged to request assistance when needed and avoid activities that cause overexertion. . Discussed working status of emergency alert device. . Discussed availability of family members and options for in home assistance if needed. . Assessed working status of life alert bracelet and patient  adherence . Provided patient information for fall alert systems   Patient Self Care Activities:  . Requires assistance with IADLs.     Initial goal documentation        Ms. Hassing was given information about Chronic Care Management services including:  1. CCM service includes personalized support from designated clinical staff supervised by her physician, including individualized plan of care and coordination with other care providers 2. 24/7 contact phone numbers for assistance for urgent and routine care needs. 3. Service will only be billed when office clinical staff spend 20 minutes or more in a month to coordinate care. 4. Only one practitioner may furnish and bill the service in a calendar month. 5. The patient may stop CCM services at any time (effective at the end of the month) by phone call to the office staff. 6. The patient will be responsible for cost sharing (co-pay) of up to 20% of the service fee (after annual deductible is met).  Patient agreed to services and verbal consent obtained.    PLAN The care management team will follow-up with Ms. [name] within the next month.   Hor[facility] Care Management [phone]

## 2020-05-18 ENCOUNTER — Ambulatory Visit: Payer: Self-pay

## 2020-05-18 NOTE — Patient Instructions (Addendum)
Thank you for allowing the Chronic Care Management team to participate in your care.   Goals Addressed            This Visit's Progress   . Chronic Disease Managment       CARE PLAN ENTRY (see longitudinal plan of care for additional care plan information)  Current Barriers:  . Chronic Disease Management support and education needs related to Hypertension, Hyperlipidemia and Diabetes.  Nurse Case Manager Clinical Goal(s):  Marland Kitchen Over the next days, patient will attend all scheduled medical appointments:  . Over the next 120 days, patient will take all medications as prescribed. . Over the next 90 days, patient will attend all scheduled medical appointments. . Over the next 90 days, patient will monitor her blood pressure and maintain a log. . Over the next 90 days, patient will adhere to recommended cardiac prudent/diabetic diet. . Over the next 60 days, patient will work with providers and care management team regarding possible need for physical therapy.  Interventions:  . Inter-disciplinary care team collaboration (see longitudinal plan of care) . Reviewed medications and indications for use.  Encouraged to take medications as prescribed and notify provider with concerns regarding prescribed regimen. Encouraged to notify care management team with concerns regarding medication management or prescription costs. . Discussed established blood pressure parameters and indications for notifying provider. Encouraged to monitor and record readings. Reports readings have been within range. Recent reading of 126/84. Marland Kitchen Discussed nutritional intake and compliance with recommended cardiac prudent/diabetic diet.  . Discussed safety precautions, activity tolerance and ability to perform tasks in the home. Reports using precautions to prevent falls. States daughter Darryll Capers is available to assist as needed. Denies current need for additional assistance in the home. . Reviewed scheduled/pending appointments.  Encouraged to attend appointments as scheduled to prevent delays in care. Encouraged to inform team if transportation assistance is required. . Discussed plans for ongoing care management and follow up. Provided direct contact information for care management team.    Patient Self Care Activities:  . Self administers medications  . Attends scheduled provider appointments . Calls pharmacy for medication refills . Performs ADL's independently  Initial goal documentation     . Risk for Falls       CARE PLAN ENTRY (see longitudinal plan of care for additional care plan information)  Current Barriers:  . Care Coordination needs related to impaired balance. (Risk for falls)  Clinical Goal(s):  Marland Kitchen Over the next 120 days, patient will not require hospitalization or emergent care due to fall related injuries. . Over the next 90 days, patient will use assistive device when ambulating. . Over the next 90 days, patient will follow home safety and fall prevention measures. . Over the next 90 days, patient will keep providers and care management team informed of changes in activity tolerance and ability to ambulate.   Interventions:  . Assessed for falls within the last year. . Reviewed medications and discussed potential side effects. . Provided education regarding home safety and fall prevention. Encouraged to use assistive devices when ambulating. Encouraged to request assistance when needed and avoid activities that cause overexertion. . Discussed working status of emergency alert device. . Discussed availability of family members and options for in home assistance if needed. . Assessed working status of life alert bracelet and patient adherence . Provided patient information for fall alert systems   Patient Self Care Activities:  . Requires assistance with IADLs.     Initial goal documentation  Ms. Vicens was given information about Chronic Care Management services  including:  1. CCM service includes personalized support from designated clinical staff supervised by her physician, including individualized plan of care and coordination with other care providers 2. 24/7 contact phone numbers for assistance for urgent and routine care needs. 3. Service will only be billed when office clinical staff spend 20 minutes or more in a month to coordinate care. 4. Only one practitioner may furnish and bill the service in a calendar month. 5. The patient may stop CCM services at any time (effective at the end of the month) by phone call to the office staff. 6. The patient will be responsible for cost sharing (co-pay) of up to 20% of the service fee (after annual deductible is met).  Patient agreed to services and verbal consent obtained.    Ms. Millman verbalized understanding of the instructions provided during the telephonic outreach today. Declined need for mailed/printed copy of the instructions.  The care management team will follow-up within the next month.   Horris Latino [facility] Care Management [phone]

## 2020-05-25 ENCOUNTER — Ambulatory Visit (INDEPENDENT_AMBULATORY_CARE_PROVIDER_SITE_OTHER): Payer: Medicare HMO

## 2020-05-25 DIAGNOSIS — E1140 Type 2 diabetes mellitus with diabetic neuropathy, unspecified: Secondary | ICD-10-CM | POA: Diagnosis not present

## 2020-05-25 DIAGNOSIS — I10 Essential (primary) hypertension: Secondary | ICD-10-CM

## 2020-05-25 NOTE — Chronic Care Management (AMB) (Signed)
Encounter for CC scheduled in error. Please disregard.

## 2020-05-28 NOTE — Chronic Care Management (AMB) (Signed)
Chronic Care Management   Follow Up Note    Name: Teresa Shields MRN: [id number] DOB: 1936-03-15  Primary Care Provider: Birdie Sons, MD Reason for referral : Chronic Care Management   Teresa Shields is a 84 y.o. year old female who is a primary care patient of Caryn Section, Kirstie Peri, MD. She is currently engaged with the chronic care management team. A brief telephonic outreach was attempted today.  Review of Ms. Masek's status, including review of consultants reports, relevant labs and test results was conducted today. Collaboration with appropriate care team members was performed as part of the comprehensive evaluation and provision of chronic care management services.    SDOH (Social Determinants of Health) assessments performed: No     Outpatient Encounter Medications as of 05/25/2020  Medication Sig  . acetaminophen (TYLENOL) 500 MG tablet Take 1,000 mg by mouth 2 (two) times daily as needed (for pain.).  Marland Kitchen allopurinol (ZYLOPRIM) 300 MG tablet TAKE 1 TABLET EVERY DAY  . Ascorbic Acid (VITAMIN C) 1000 MG tablet Take 1,000 mg by mouth daily.  Marland Kitchen aspirin EC 81 MG tablet Take 81 mg by mouth daily.  Marland Kitchen atorvastatin (LIPITOR) 40 MG tablet TAKE 1 TABLET EVERY EVENING  . calcium-vitamin D (OSCAL 500/200 D-3) 500-200 MG-UNIT per tablet Take 1 tablet by mouth daily with breakfast.   . colchicine 0.6 MG tablet 2 tablets at first sign of gout, then one daily as needed  . gabapentin (NEURONTIN) 100 MG capsule TAKE 1 CAPSULE TWICE DAILY (Patient taking differently: Take 100 mg by mouth at bedtime. )  . HYDROcodone-acetaminophen (NORCO/VICODIN) 5-325 MG tablet Take 1-2 tablets by mouth every 4 (four) hours as needed for moderate pain.  . indomethacin (INDOCIN) 25 MG capsule Take 1-2 capsules (25-50 mg total) by mouth 2 (two) times daily with a meal. For gout (Patient taking differently: Take 25-50 mg by mouth 2 (two) times daily as needed (for gout flare ups). )  . metoprolol succinate  (TOPROL-XL) 50 MG 24 hr tablet Take 1 tablet (50 mg total) by mouth daily. For high blood pressure  . Multiple Vitamin (MULTIVITAMIN WITH MINERALS) TABS tablet Take 1 tablet by mouth daily.  . Omega-3 Fatty Acids (FISH OIL) 1000 MG CAPS Take 1,000 mg by mouth daily.  Marland Kitchen triamterene-hydrochlorothiazide (MAXZIDE) 75-50 MG tablet TAKE 1 TABLET EVERY DAY  . Vitamin E 400 UNITS TABS Take 400 Units by mouth daily.   . Vitamins/Minerals TABS Take 1 tablet by mouth daily.    No facility-administered encounter medications on file as of 05/25/2020.     Objective:  BP Readings from Last 3 Encounters:  03/22/20 126/62  09/17/19 (!) 145/75  07/26/18 (!) 146/74    Lab Results  Component Value Date   CHOL 146 03/22/2020   HDL 34 (L) 03/22/2020   LDLCALC 52 03/22/2020   TRIG 398 (H) 03/22/2020   CHOLHDL 4.3 03/22/2020   Lab Results  Component Value Date   HGBA1C 7.5 (A) 03/22/2020     Goals Addressed            This Visit's Progress   . Chronic Disease Managment       CARE PLAN ENTRY (see longitudinal plan of care for additional care plan information)  Current Barriers:  . Chronic Disease Management support and education needs related to Hypertension, Hyperlipidemia and Diabetes.  Case Manager Clinical Goal(s):  Marland Kitchen Over the next days, patient will attend all scheduled medical appointments:  . Over the next 120 days, patient  will take all medications as prescribed. . Over the next 90 days, patient will attend all scheduled medical appointments. . Over the next 90 days, patient will monitor her blood pressure and maintain a log. . Over the next 90 days, patient will adhere to recommended cardiac prudent/diabetic diet. . Over the next 60 days, patient will work with providers and care management team regarding possible need for physical therapy.  Interventions:  . Inter-disciplinary care team collaboration (see longitudinal plan of care) . Reviewed medications and indications for use.  Neurology team recently offered to prescribe Lyrica and discontinue gabapentin. She prefers to discuss this with Dr. Sherrie Mustache during her visit on 06/23/20. Denies other concerns regarding medications or prescription costs. . Discussed blood pressure readings. Reports readings have been in the 120's over 80's.  . Discussed nutritional intake and compliance with recommended diet. No changes with appetite. Currently, she does not monitor her blood glucose  levels but attempting to adhere to recommended diet. Reports taking Boost daily between meals.   . Discussed safety precautions, activity tolerance and ability to perform tasks in the home. Reports following precautions and using walker her walker. She was offered a referral for hand therapy in addition to physical therapy. She prefers to decide after her visit with Dr. Sherrie Mustache. . Reviewed scheduled/pending appointments. Scheduled for PCP visit with Dr. Sherrie Mustache on 06/23/20.      Patient Self Care Activities:  . Self administers medications  . Attends scheduled provider appointments . Calls pharmacy for medication refills . Performs ADL's independently  Please see past updates related to this goal by clicking on the "Past Updates" button in the selected goal          PLAN The care management team will follow-up with Ms. Magda next month.   Katha Cabal [facility] Care Management [phone]

## 2020-05-31 NOTE — Patient Instructions (Signed)
Thank you for allowing the Chronic Care Management team to participate in your care.  Goals Addressed            This Visit's Progress   . Chronic Disease Managment       CARE PLAN ENTRY (see longitudinal plan of care for additional care plan information)  Current Barriers:  . Chronic Disease Management support and education needs related to Hypertension, Hyperlipidemia and Diabetes.  Case Manager Clinical Goal(s):  Marland Kitchen Over the next days, patient will attend all scheduled medical appointments:  . Over the next 120 days, patient will take all medications as prescribed. . Over the next 90 days, patient will attend all scheduled medical appointments. . Over the next 90 days, patient will monitor her blood pressure and maintain a log. . Over the next 90 days, patient will adhere to recommended cardiac prudent/diabetic diet. . Over the next 60 days, patient will work with providers and care management team regarding possible need for physical therapy.  Interventions:  . Inter-disciplinary care team collaboration (see longitudinal plan of care) . Reviewed medications and indications for use. Neurology team recently offered to prescribe Lyrica and discontinue gabapentin. She prefers to discuss this with Dr. Sherrie Mustache during her visit on 06/23/20. Denies other concerns regarding medications or prescription costs. . Discussed blood pressure readings. Reports readings have been in the 120's over 80's.  . Discussed nutritional intake and compliance with recommended diet. No changes with appetite. Currently, she does not monitor her blood glucose  levels but attempting to adhere to recommended diet. Reports taking Boost daily between meals.   . Discussed safety precautions, activity tolerance and ability to perform tasks in the home. Reports following precautions and using walker her walker. She was offered a referral for hand therapy in addition to physical therapy. She prefers to decide after her visit  with Dr. Sherrie Mustache. . Reviewed scheduled/pending appointments. Scheduled for PCP visit with Dr. Sherrie Mustache on 06/23/20.      Patient Self Care Activities:  . Self administers medications  . Attends scheduled provider appointments . Calls pharmacy for medication refills . Performs ADL's independently  Please see past updates related to this goal by clicking on the "Past Updates" button in the selected goal         Teresa Shields verbalized understanding of the information discussed during the telephonic outreach today. Declined need for mailed/printed instructions.   The care management team will follow-up with Teresa Shields next month.   Katha Cabal [facility] Care Management [phone]

## 2020-06-14 ENCOUNTER — Other Ambulatory Visit: Payer: Self-pay | Admitting: Family Medicine

## 2020-06-22 NOTE — Progress Notes (Signed)
I,Roshena L Chambers,acting as a scribe for Lelon Huh, MD.,have documented all relevant documentation on the behalf of Lelon Huh, MD,as directed by  Lelon Huh, MD while in the presence of Lelon Huh, MD.  Established patient visit   Patient: Teresa Shields   DOB: February 24, 1936   84 y.o. Female  MRN: [id number] Visit Date: 06/23/2020  Today's healthcare provider: Lelon Huh, MD   Chief Complaint  Patient presents with  . Diabetes   Subjective    HPI  Diabetes Mellitus Type II, Follow-up  Lab Results  Component Value Date   HGBA1C 7.5 (A) 03/22/2020   HGBA1C 6.8 (A) 09/17/2019   HGBA1C 6.8 (A) 07/26/2018   Wt Readings from Last 3 Encounters:  06/23/20 161 lb (73 kg)  03/22/20 163 lb (73.9 kg)  09/17/19 162 lb 12.8 oz (73.8 kg)   Last seen for diabetes 3 months ago.  Management since then includes advising patient to improve diet. She reports good compliance with treatment. She is not having side effects.  Symptoms: No fatigue No foot ulcerations  No appetite changes No nausea  No paresthesia of the feet  No polydipsia  No polyuria No visual disturbances   No vomiting     Home blood sugar records: blood sugars are not checked  Episodes of hypoglycemia? No    Current insulin regiment: none Most Recent Eye Exam: not UTD Current exercise: walking Current diet habits: well balanced  Pertinent Labs: Lab Results  Component Value Date   CHOL 146 03/22/2020   HDL 34 (L) 03/22/2020   LDLCALC 52 03/22/2020   TRIG 398 (H) 03/22/2020   CHOLHDL 4.3 03/22/2020   Lab Results  Component Value Date   NA 141 03/22/2020   K 3.9 03/22/2020   CREATININE 1.27 (H) 03/22/2020   GFRNONAA 40 (L) 03/22/2020   GFRAA 46 (L) 03/22/2020   GLUCOSE 189 (H) 03/22/2020     --------------------------------------------------------------------------------------------------- Her only complaints today are that she continues to have constant pain in the palms of hands  and has trouble with her gait and balance requiring a walker. She was referred to neurology and both problems were felt to be related to diabetic neuropathy. She was recommended to have home PT for gait training which was deferred to PCP. They also recommended change from gabapentin to Lyrica, which was also deferred. Patient states that gabapentin did not help with neuropathy and caused bizarre dreams, so she stopped it a few months.   Was noted to have elevated eGFR on labs from April and was advise to start drinking more water which she has been doing. .       Medications: Outpatient Medications Prior to Visit  Medication Sig  . acetaminophen (TYLENOL) 500 MG tablet Take 1,000 mg by mouth 2 (two) times daily as needed (for pain.).  Marland Kitchen allopurinol (ZYLOPRIM) 300 MG tablet TAKE 1 TABLET EVERY DAY  . Ascorbic Acid (VITAMIN C) 1000 MG tablet Take 1,000 mg by mouth daily.  Marland Kitchen aspirin EC 81 MG tablet Take 81 mg by mouth daily.  Marland Kitchen atorvastatin (LIPITOR) 40 MG tablet TAKE 1 TABLET EVERY EVENING  . calcium-vitamin D (OSCAL 500/200 D-3) 500-200 MG-UNIT per tablet Take 1 tablet by mouth daily with breakfast.   . colchicine 0.6 MG tablet 2 tablets at first sign of gout, then one daily as needed  . HYDROcodone-acetaminophen (NORCO/VICODIN) 5-325 MG tablet Take 1-2 tablets by mouth every 4 (four) hours as needed for moderate pain.  . indomethacin (INDOCIN) 25  MG capsule Take 1-2 capsules (25-50 mg total) by mouth 2 (two) times daily with a meal. For gout (Patient taking differently: Take 25-50 mg by mouth 2 (two) times daily as needed (for gout flare ups). )  . metoprolol succinate (TOPROL-XL) 50 MG 24 hr tablet TAKE 1 TABLET EVERY DAY FOR HIGH BLOOD PRESSURE  . Multiple Vitamin (MULTIVITAMIN WITH MINERALS) TABS tablet Take 1 tablet by mouth daily.  . Omega-3 Fatty Acids (FISH OIL) 1000 MG CAPS Take 1,000 mg by mouth daily.  Marland Kitchen triamterene-hydrochlorothiazide (MAXZIDE) 75-50 MG tablet TAKE 1 TABLET EVERY DAY   . Vitamins/Minerals TABS Take 1 tablet by mouth daily.   Marland Kitchen gabapentin (NEURONTIN) 100 MG capsule TAKE 1 CAPSULE TWICE DAILY (Patient not taking: Reported on 06/23/2020)  . [DISCONTINUED] Vitamin E 400 UNITS TABS Take 400 Units by mouth daily.    No facility-administered medications prior to visit.    Review of Systems  Constitutional: Negative for appetite change, chills, fatigue and fever.  Respiratory: Negative for chest tightness and shortness of breath.   Cardiovascular: Negative for chest pain and palpitations.  Gastrointestinal: Negative for abdominal pain, nausea and vomiting.  Neurological: Negative for dizziness and weakness.      Objective    BP 119/70 (BP Location: Right Arm, Patient Position: Sitting, Cuff Size: Large)   Pulse 68   Temp (!) 97.3 F (36.3 C) (Temporal)   Resp 16   Ht _0  (1.676 m)   Wt 161 lb (73 kg)   BMI 25.99 kg/m    Physical Exam  General appearance:  Overweight female, cooperative and in no acute distress Head: Normocephalic, without obvious abnormality, atraumatic Respiratory: Respirations even and unlabored, normal respiratory rate   Results for orders placed or performed in visit on 06/23/20  POCT HgB A1C  Result Value Ref Range   Hemoglobin A1C 7.4 (A) 4.0 - 5.6 %   Est. average glucose Bld gHb Est-mCnc 166     Assessment & Plan     1. Type 2 diabetes mellitus with diabetic neuropathy, without long-term current use of insulin ([place]) Fairly well controlled with diet with a1c improved a bit. Consider metformin if eGFR improves.    2. Neuralgia  pregabalin (LYRICA) 50 MG capsule; Take 1 capsule (50 mg total) by mouth 3 (three) times daily.  Dispense: 90 capsule; Refill: 3  3. Diabetes mellitus with nephropathy ([place])   4. Chronic kidney disease, stage 3b Encourage abundant water intake. No lab available this week. Plan on rechecking renal panel at follow up   5. Gait disturbance Per neurology thought to be secondary to  neuropathy.  - Ambulatory referral to Home Health   No follow-ups on file.      The entirety of the information documented in the History of Present Illness, Review of Systems and Physical Exam were personally obtained by me. Portions of this information were initially documented by the CMA and reviewed by me for thoroughness and accuracy.      Lelon Huh, MD  [facility] [phone] (phone) [phone] (fax)  [place]

## 2020-06-23 ENCOUNTER — Encounter: Payer: Self-pay | Admitting: Family Medicine

## 2020-06-23 ENCOUNTER — Other Ambulatory Visit: Payer: Self-pay

## 2020-06-23 ENCOUNTER — Ambulatory Visit (INDEPENDENT_AMBULATORY_CARE_PROVIDER_SITE_OTHER): Payer: Medicare HMO | Admitting: Family Medicine

## 2020-06-23 VITALS — BP 119/70 | HR 68 | Temp 97.3°F | Resp 16 | Ht 66.0 in | Wt 161.0 lb

## 2020-06-23 DIAGNOSIS — E1121 Type 2 diabetes mellitus with diabetic nephropathy: Secondary | ICD-10-CM

## 2020-06-23 DIAGNOSIS — E1140 Type 2 diabetes mellitus with diabetic neuropathy, unspecified: Secondary | ICD-10-CM | POA: Diagnosis not present

## 2020-06-23 DIAGNOSIS — R269 Unspecified abnormalities of gait and mobility: Secondary | ICD-10-CM

## 2020-06-23 DIAGNOSIS — M792 Neuralgia and neuritis, unspecified: Secondary | ICD-10-CM

## 2020-06-23 DIAGNOSIS — N1832 Chronic kidney disease, stage 3b: Secondary | ICD-10-CM | POA: Diagnosis not present

## 2020-06-23 LAB — POCT GLYCOSYLATED HEMOGLOBIN (HGB A1C)
Est. average glucose Bld gHb Est-mCnc: 166
Hemoglobin A1C: 7.4 % — AB (ref 4.0–5.6)

## 2020-06-23 MED ORDER — PREGABALIN 50 MG PO CAPS: 50.0000 mg | ORAL_CAPSULE | Freq: Three times a day (TID) | ORAL | 3 refills | Status: DC

## 2020-06-23 NOTE — Progress Notes (Deleted)
Established patient visit   Patient: Teresa Shields   DOB: 1936-02-10   84 y.o. Female  MRN: [id number] Visit Date: 09/24/2020  Today's healthcare provider: Lelon Huh, MD   No chief complaint on file.  Subjective    HPI  Follow up for gait and balance disturbance  The patient was last seen for this on July 14th. Changes made at last visit include referral to Home Physical therapy. Had previously been evaluated by neurology and was thought to be secondary to neuropathy. .  She reports {excellent/good/fair/poor:19665} compliance with treatment. She feels that condition is {improved/worse/unchanged:3041574}. She {is/is not:21021397} having side effects. ***  -----------------------------------------------------------------------------------------  Follow up for neuropathy of hands.   The patient was last seen for this on July 14th. Changes made at last visit includes starting Lyrica as she had previously tried gabapentin which caused bizarre dreams and did not help with pain in hands. .  She reports {excellent/good/fair/poor:19665} compliance with treatment. She feels that condition is {improved/worse/unchanged:3041574}. She {is/is not:21021397} having side effects. ***  -----------------------------------------------------------------------------------------  Diabetes Mellitus Type II, Follow-up  Lab Results  Component Value Date   HGBA1C 7.4 (A) 06/23/2020   HGBA1C 7.5 (A) 03/22/2020   HGBA1C 6.8 (A) 09/17/2019   Wt Readings from Last 3 Encounters:  06/23/20 161 lb (73 kg)  03/22/20 163 lb (73.9 kg)  09/17/19 162 lb 12.8 oz (73.8 kg)   Last seen for diabetes on July 14th.  Management since then includes increasing water in diet due to increase in eGFR. Considered metformin, but held of due to chronic kidney disease.  . She reports {excellent/good/fair/poor:19665} compliance with treatment. She {is/is not:21021397} having side effects. {document side  effects if present:1} Symptoms: {Yes/No:20286} fatigue {Yes/No:20286} foot ulcerations  {Yes/No:20286} appetite changes {Yes/No:20286} nausea  {Yes/No:20286} paresthesia of the feet  {Yes/No:20286} polydipsia  {Yes/No:20286} polyuria {Yes/No:20286} visual disturbances   {Yes/No:20286} vomiting     Home blood sugar records: {diabetes glucometry results:16657}  Episodes of hypoglycemia? {Yes/No:20286} {enter symptoms and frequency of symptoms if yes:1}   Current insulin regiment: {enter 'none' or type of insulin and number of units taken with each dose of each insulin formulation that the patient is taking:1} Most Recent Eye Exam: *** {Current exercise:16438:::1} {Current diet habits:16563:::1}  Pertinent Labs: Lab Results  Component Value Date   CHOL 146 03/22/2020   HDL 34 (L) 03/22/2020   LDLCALC 52 03/22/2020   TRIG 398 (H) 03/22/2020   CHOLHDL 4.3 03/22/2020   Lab Results  Component Value Date   NA 141 03/22/2020   K 3.9 03/22/2020   CREATININE 1.27 (H) 03/22/2020   GFRNONAA 40 (L) 03/22/2020   GFRAA 46 (L) 03/22/2020   GLUCOSE 189 (H) 03/22/2020     ---------------------------------------------------------------------------------------------------   {Show patient history (optional):23778::" "}   Medications: Outpatient Medications Prior to Visit  Medication Sig  . acetaminophen (TYLENOL) 500 MG tablet Take 1,000 mg by mouth 2 (two) times daily as needed (for pain.).  Marland Kitchen allopurinol (ZYLOPRIM) 300 MG tablet TAKE 1 TABLET EVERY DAY  . Ascorbic Acid (VITAMIN C) 1000 MG tablet Take 1,000 mg by mouth daily.  Marland Kitchen aspirin EC 81 MG tablet Take 81 mg by mouth daily.  Marland Kitchen atorvastatin (LIPITOR) 40 MG tablet TAKE 1 TABLET EVERY EVENING  . calcium-vitamin D (OSCAL 500/200 D-3) 500-200 MG-UNIT per tablet Take 1 tablet by mouth daily with breakfast.   . colchicine 0.6 MG tablet 2 tablets at first sign of gout, then one daily as needed  .  HYDROcodone-acetaminophen (NORCO/VICODIN)  5-325 MG tablet Take 1-2 tablets by mouth every 4 (four) hours as needed for moderate pain.  . indomethacin (INDOCIN) 25 MG capsule Take 1-2 capsules (25-50 mg total) by mouth 2 (two) times daily with a meal. For gout (Patient taking differently: Take 25-50 mg by mouth 2 (two) times daily as needed (for gout flare ups). )  . metoprolol succinate (TOPROL-XL) 50 MG 24 hr tablet TAKE 1 TABLET EVERY DAY FOR HIGH BLOOD PRESSURE  . Multiple Vitamin (MULTIVITAMIN WITH MINERALS) TABS tablet Take 1 tablet by mouth daily.  . Omega-3 Fatty Acids (FISH OIL) 1000 MG CAPS Take 1,000 mg by mouth daily.  . pregabalin (LYRICA) 50 MG capsule Take 1 capsule (50 mg total) by mouth 3 (three) times daily.  Marland Kitchen triamterene-hydrochlorothiazide (MAXZIDE) 75-50 MG tablet TAKE 1 TABLET EVERY DAY  . Vitamins/Minerals TABS Take 1 tablet by mouth daily.    No facility-administered medications prior to visit.    Review of Systems  {Heme  Chem  Endocrine  Serology  Results Review (optional):23779::" "}  Objective    There were no vitals taken for this visit. {Show previous vital signs (optional):23777::" "}  Physical Exam  ***  No results found for any visits on 09/24/20.  Assessment & Plan     ***  No follow-ups on file.      {provider attestation***:1}   Lelon Huh, MD  [facility] [phone] (phone) [phone] (fax)  [place]

## 2020-06-29 DIAGNOSIS — N1832 Chronic kidney disease, stage 3b: Secondary | ICD-10-CM | POA: Diagnosis not present

## 2020-06-29 DIAGNOSIS — J309 Allergic rhinitis, unspecified: Secondary | ICD-10-CM | POA: Diagnosis not present

## 2020-06-29 DIAGNOSIS — M810 Age-related osteoporosis without current pathological fracture: Secondary | ICD-10-CM | POA: Diagnosis not present

## 2020-06-29 DIAGNOSIS — E1122 Type 2 diabetes mellitus with diabetic chronic kidney disease: Secondary | ICD-10-CM | POA: Diagnosis not present

## 2020-06-29 DIAGNOSIS — E782 Mixed hyperlipidemia: Secondary | ICD-10-CM | POA: Diagnosis not present

## 2020-06-29 DIAGNOSIS — M109 Gout, unspecified: Secondary | ICD-10-CM | POA: Diagnosis not present

## 2020-06-29 DIAGNOSIS — M792 Neuralgia and neuritis, unspecified: Secondary | ICD-10-CM | POA: Diagnosis not present

## 2020-06-29 DIAGNOSIS — I129 Hypertensive chronic kidney disease with stage 1 through stage 4 chronic kidney disease, or unspecified chronic kidney disease: Secondary | ICD-10-CM | POA: Diagnosis not present

## 2020-06-29 DIAGNOSIS — E1140 Type 2 diabetes mellitus with diabetic neuropathy, unspecified: Secondary | ICD-10-CM | POA: Diagnosis not present

## 2020-07-01 ENCOUNTER — Telehealth: Payer: Self-pay

## 2020-07-01 ENCOUNTER — Ambulatory Visit: Payer: Self-pay

## 2020-07-01 ENCOUNTER — Other Ambulatory Visit: Payer: Self-pay | Admitting: Family Medicine

## 2020-07-01 NOTE — Chronic Care Management (AMB) (Signed)
  Chronic Care Management   Outreach Note  07/01/2020 Name: Teresa Shields MRN: [id number] DOB: [birth year]  Primary Care Provider: [name], MD Reason for referral : Chronic Care Management   An unsuccessful telephone outreach was attempted today. [name] is currently engaged with the chronic care management team.   A HIPAA compliant voice message was left requesting a return call.    Follow Up Plan: The care management team will reach out to [name] again next week.   Katha Cabal [facility] Practice/[facility] Care Management [phone]

## 2020-07-02 DIAGNOSIS — E1140 Type 2 diabetes mellitus with diabetic neuropathy, unspecified: Secondary | ICD-10-CM | POA: Diagnosis not present

## 2020-07-02 DIAGNOSIS — M792 Neuralgia and neuritis, unspecified: Secondary | ICD-10-CM | POA: Diagnosis not present

## 2020-07-02 DIAGNOSIS — E1122 Type 2 diabetes mellitus with diabetic chronic kidney disease: Secondary | ICD-10-CM | POA: Diagnosis not present

## 2020-07-02 DIAGNOSIS — I129 Hypertensive chronic kidney disease with stage 1 through stage 4 chronic kidney disease, or unspecified chronic kidney disease: Secondary | ICD-10-CM | POA: Diagnosis not present

## 2020-07-02 DIAGNOSIS — N1832 Chronic kidney disease, stage 3b: Secondary | ICD-10-CM | POA: Diagnosis not present

## 2020-07-02 DIAGNOSIS — E782 Mixed hyperlipidemia: Secondary | ICD-10-CM | POA: Diagnosis not present

## 2020-07-02 DIAGNOSIS — J309 Allergic rhinitis, unspecified: Secondary | ICD-10-CM | POA: Diagnosis not present

## 2020-07-02 DIAGNOSIS — M109 Gout, unspecified: Secondary | ICD-10-CM | POA: Diagnosis not present

## 2020-07-02 DIAGNOSIS — M810 Age-related osteoporosis without current pathological fracture: Secondary | ICD-10-CM | POA: Diagnosis not present

## 2020-07-05 DIAGNOSIS — E1122 Type 2 diabetes mellitus with diabetic chronic kidney disease: Secondary | ICD-10-CM | POA: Diagnosis not present

## 2020-07-05 DIAGNOSIS — E1140 Type 2 diabetes mellitus with diabetic neuropathy, unspecified: Secondary | ICD-10-CM | POA: Diagnosis not present

## 2020-07-05 DIAGNOSIS — E782 Mixed hyperlipidemia: Secondary | ICD-10-CM | POA: Diagnosis not present

## 2020-07-05 DIAGNOSIS — I129 Hypertensive chronic kidney disease with stage 1 through stage 4 chronic kidney disease, or unspecified chronic kidney disease: Secondary | ICD-10-CM | POA: Diagnosis not present

## 2020-07-05 DIAGNOSIS — M109 Gout, unspecified: Secondary | ICD-10-CM | POA: Diagnosis not present

## 2020-07-05 DIAGNOSIS — M810 Age-related osteoporosis without current pathological fracture: Secondary | ICD-10-CM | POA: Diagnosis not present

## 2020-07-05 DIAGNOSIS — N1832 Chronic kidney disease, stage 3b: Secondary | ICD-10-CM | POA: Diagnosis not present

## 2020-07-05 DIAGNOSIS — M792 Neuralgia and neuritis, unspecified: Secondary | ICD-10-CM | POA: Diagnosis not present

## 2020-07-05 DIAGNOSIS — J309 Allergic rhinitis, unspecified: Secondary | ICD-10-CM | POA: Diagnosis not present

## 2020-07-06 DIAGNOSIS — E1122 Type 2 diabetes mellitus with diabetic chronic kidney disease: Secondary | ICD-10-CM | POA: Diagnosis not present

## 2020-07-06 DIAGNOSIS — E782 Mixed hyperlipidemia: Secondary | ICD-10-CM

## 2020-07-06 DIAGNOSIS — M792 Neuralgia and neuritis, unspecified: Secondary | ICD-10-CM | POA: Diagnosis not present

## 2020-07-06 DIAGNOSIS — J309 Allergic rhinitis, unspecified: Secondary | ICD-10-CM | POA: Diagnosis not present

## 2020-07-06 DIAGNOSIS — M810 Age-related osteoporosis without current pathological fracture: Secondary | ICD-10-CM | POA: Diagnosis not present

## 2020-07-06 DIAGNOSIS — E1140 Type 2 diabetes mellitus with diabetic neuropathy, unspecified: Secondary | ICD-10-CM | POA: Diagnosis not present

## 2020-07-06 DIAGNOSIS — I129 Hypertensive chronic kidney disease with stage 1 through stage 4 chronic kidney disease, or unspecified chronic kidney disease: Secondary | ICD-10-CM | POA: Diagnosis not present

## 2020-07-06 DIAGNOSIS — N1832 Chronic kidney disease, stage 3b: Secondary | ICD-10-CM

## 2020-07-06 DIAGNOSIS — M109 Gout, unspecified: Secondary | ICD-10-CM | POA: Diagnosis not present

## 2020-07-07 DIAGNOSIS — M810 Age-related osteoporosis without current pathological fracture: Secondary | ICD-10-CM | POA: Diagnosis not present

## 2020-07-07 DIAGNOSIS — M792 Neuralgia and neuritis, unspecified: Secondary | ICD-10-CM | POA: Diagnosis not present

## 2020-07-07 DIAGNOSIS — N1832 Chronic kidney disease, stage 3b: Secondary | ICD-10-CM | POA: Diagnosis not present

## 2020-07-07 DIAGNOSIS — E1122 Type 2 diabetes mellitus with diabetic chronic kidney disease: Secondary | ICD-10-CM | POA: Diagnosis not present

## 2020-07-07 DIAGNOSIS — E782 Mixed hyperlipidemia: Secondary | ICD-10-CM | POA: Diagnosis not present

## 2020-07-07 DIAGNOSIS — E1140 Type 2 diabetes mellitus with diabetic neuropathy, unspecified: Secondary | ICD-10-CM | POA: Diagnosis not present

## 2020-07-07 DIAGNOSIS — J309 Allergic rhinitis, unspecified: Secondary | ICD-10-CM | POA: Diagnosis not present

## 2020-07-07 DIAGNOSIS — I129 Hypertensive chronic kidney disease with stage 1 through stage 4 chronic kidney disease, or unspecified chronic kidney disease: Secondary | ICD-10-CM | POA: Diagnosis not present

## 2020-07-07 DIAGNOSIS — M109 Gout, unspecified: Secondary | ICD-10-CM | POA: Diagnosis not present

## 2020-07-08 ENCOUNTER — Ambulatory Visit (INDEPENDENT_AMBULATORY_CARE_PROVIDER_SITE_OTHER): Payer: Medicare HMO

## 2020-07-08 ENCOUNTER — Other Ambulatory Visit: Payer: Self-pay | Admitting: Family Medicine

## 2020-07-08 DIAGNOSIS — I10 Essential (primary) hypertension: Secondary | ICD-10-CM

## 2020-07-08 DIAGNOSIS — E1140 Type 2 diabetes mellitus with diabetic neuropathy, unspecified: Secondary | ICD-10-CM

## 2020-07-08 DIAGNOSIS — R269 Unspecified abnormalities of gait and mobility: Secondary | ICD-10-CM

## 2020-07-08 MED ORDER — PREGABALIN 50 MG PO CAPS: 50.0000 mg | ORAL_CAPSULE | Freq: Three times a day (TID) | ORAL | 1 refills | Status: DC

## 2020-07-08 NOTE — Progress Notes (Signed)
Received Heather Roberts RN that patient called stating that Lyrica was working well and requested prescription be sent to her mail order pharmacy.

## 2020-07-08 NOTE — Chronic Care Management (AMB) (Signed)
Chronic Care Management   Follow Up Note   07/08/2020 Name: Teresa Shields MRN: [id number] DOB: [birth year]  Primary Care Provider: [name], MD Reason for referral : Chronic Care Management   Teresa Shields is a 84 y.o. year old female who is a primary care patient of Sherrie Mustache, [name], MD. She is currently engaged with the chronic care management team. A routine telephonic outreach was conducted today.  Review of Teresa Shields's status, including review of consultants reports, relevant labs and test results was conducted today. Collaboration with appropriate care team members was performed as part of the comprehensive evaluation and provision of chronic care management services.    SDOH (Social Determinants of Health) assessments performed: No    Outpatient Encounter Medications as of 07/08/2020  Medication Sig  . acetaminophen (TYLENOL) 500 MG tablet Take 1,000 mg by mouth 2 (two) times daily as needed (for pain.).  Marland Kitchen allopurinol (ZYLOPRIM) 300 MG tablet TAKE 1 TABLET EVERY DAY  . Ascorbic Acid (VITAMIN C) 1000 MG tablet Take 1,000 mg by mouth daily.  Marland Kitchen aspirin EC 81 MG tablet Take 81 mg by mouth daily.  Marland Kitchen atorvastatin (LIPITOR) 40 MG tablet TAKE 1 TABLET EVERY EVENING  . calcium-vitamin D (OSCAL 500/200 D-3) 500-200 MG-UNIT per tablet Take 1 tablet by mouth daily with breakfast.   . colchicine 0.6 MG tablet 2 tablets at first sign of gout, then one daily as needed  . HYDROcodone-acetaminophen (NORCO/VICODIN) 5-325 MG tablet Take 1-2 tablets by mouth every 4 (four) hours as needed for moderate pain.  . indomethacin (INDOCIN) 25 MG capsule Take 1-2 capsules (25-50 mg total) by mouth 2 (two) times daily with a meal. For gout (Patient taking differently: Take 25-50 mg by mouth 2 (two) times daily as needed (for gout flare ups). )  . metoprolol succinate (TOPROL-XL) 50 MG 24 hr tablet TAKE 1 TABLET EVERY DAY FOR HIGH BLOOD PRESSURE  . Multiple Vitamin (MULTIVITAMIN WITH MINERALS)  TABS tablet Take 1 tablet by mouth daily.  . Omega-3 Fatty Acids (FISH OIL) 1000 MG CAPS Take 1,000 mg by mouth daily.  . pregabalin (LYRICA) 50 MG capsule Take 1 capsule (50 mg total) by mouth 3 (three) times daily.  Marland Kitchen triamterene-hydrochlorothiazide (MAXZIDE) 75-50 MG tablet TAKE 1 TABLET EVERY DAY  . Vitamins/Minerals TABS Take 1 tablet by mouth daily.    No facility-administered encounter medications on file as of 07/08/2020.      Goals Addressed            This Visit's Progress   . Chronic Disease Managment       CARE PLAN ENTRY (see longitudinal plan of care for additional care plan information)  Current Barriers:  . Chronic Disease Management support and education needs related to Hypertension, Hyperlipidemia and Diabetes.  Case Manager Clinical Goal(s):  Marland Kitchen Over the next days, patient will attend all scheduled medical appointments:  . Over the next 120 days, patient will take all medications as prescribed. . Over the next 90 days, patient will attend all scheduled medical appointments. . Over the next 90 days, patient will monitor her blood pressure and maintain a log. . Over the next 90 days, patient will adhere to recommended cardiac prudent/diabetic diet. . Over the next 60 days, patient will work with providers and care management team regarding possible need for physical therapy.  Interventions:  . Inter-disciplinary care team collaboration (see longitudinal plan of care) . Reviewed medications. Recently changed from gabapentin to Lyrica. Reports very good  pain relief since starting Lyrica.    . Discussed recent blood pressure readings. Reports readings have remained within range. Reading today was 126/74.  Marland Kitchen Reviewed safety precautions and current activity level. Currently attending physical therapy sessions twice a week. Reports significant improvement with gait and balance. She continues to use her walker. Current goal is to transition to a cane. Attempting to walk  for at least 30 min/day. No recent falls.   . Discussed plan for ongoing care management and follow-up. Teresa Shields reports doing very well. Very pleased with level of activity since changing medications and continuing PT. Denies immediate care management concerns or additional need for assistance in the home. Will plan to outreach in mid September.   Patient Self Care Activities:  . Self administers medications  . Attends scheduled provider appointments . Calls pharmacy for medication refills . Performs ADL's independently  Please see past updates related to this goal by clicking on the "Past Updates" button in the selected goal         PLAN  The care management team will follow-up with Teresa Shields within the next two months.   Katha Cabal [facility] Practice/[facility] Care Management [phone]

## 2020-07-13 DIAGNOSIS — M792 Neuralgia and neuritis, unspecified: Secondary | ICD-10-CM | POA: Diagnosis not present

## 2020-07-13 DIAGNOSIS — E1122 Type 2 diabetes mellitus with diabetic chronic kidney disease: Secondary | ICD-10-CM | POA: Diagnosis not present

## 2020-07-13 DIAGNOSIS — I129 Hypertensive chronic kidney disease with stage 1 through stage 4 chronic kidney disease, or unspecified chronic kidney disease: Secondary | ICD-10-CM | POA: Diagnosis not present

## 2020-07-13 DIAGNOSIS — M810 Age-related osteoporosis without current pathological fracture: Secondary | ICD-10-CM | POA: Diagnosis not present

## 2020-07-13 DIAGNOSIS — E1140 Type 2 diabetes mellitus with diabetic neuropathy, unspecified: Secondary | ICD-10-CM | POA: Diagnosis not present

## 2020-07-13 DIAGNOSIS — J309 Allergic rhinitis, unspecified: Secondary | ICD-10-CM | POA: Diagnosis not present

## 2020-07-13 DIAGNOSIS — N1832 Chronic kidney disease, stage 3b: Secondary | ICD-10-CM | POA: Diagnosis not present

## 2020-07-13 DIAGNOSIS — E782 Mixed hyperlipidemia: Secondary | ICD-10-CM | POA: Diagnosis not present

## 2020-07-13 DIAGNOSIS — M109 Gout, unspecified: Secondary | ICD-10-CM | POA: Diagnosis not present

## 2020-07-13 NOTE — Patient Instructions (Addendum)
Thank you for allowing the Chronic Care Management team to participate in your care.   Goals Addressed            This Visit's Progress   . Chronic Disease Managment       CARE PLAN ENTRY (see longitudinal plan of care for additional care plan information)  Current Barriers:  . Chronic Disease Management support and education needs related to Hypertension, Hyperlipidemia and Diabetes.  Case Manager Clinical Goal(s):  Marland Kitchen Over the next days, patient will attend all scheduled medical appointments:  . Over the next 120 days, patient will take all medications as prescribed. . Over the next 90 days, patient will attend all scheduled medical appointments. . Over the next 90 days, patient will monitor her blood pressure and maintain a log. . Over the next 90 days, patient will adhere to recommended cardiac prudent/diabetic diet. . Over the next 60 days, patient will work with providers and care management team regarding possible need for physical therapy.  Interventions:  . Inter-disciplinary care team collaboration (see longitudinal plan of care) . Reviewed medications. Recently changed from gabapentin to Lyrica. Reports very good pain relief since starting Lyrica.    . Discussed recent blood pressure readings. Reports readings have remained within range. Reading today was 126/74.  Marland Kitchen Reviewed safety precautions and current activity level. Currently attending physical therapy sessions twice a week. Reports significant improvement with gait and balance. She continues to use her walker. Current goal is to transition to a cane. Attempting to walk for at least 30 min/day. No recent falls.   . Discussed plan for ongoing care management and follow-up. Teresa Shields reports doing very well. Very pleased with level of activity since changing medications and continuing PT. Denies immediate care management concerns or additional need for assistance in the home. Will plan to outreach in mid  September.   Patient Self Care Activities:  . Self administers medications  . Attends scheduled provider appointments . Calls pharmacy for medication refills . Performs ADL's independently  Please see past updates related to this goal by clicking on the "Past Updates" button in the selected goal         Teresa Shields verbalized understanding of the information discussed during the telephonic outreach today. Declined need for a mailed/printed copy of the instructions.   The care management team will follow-up with Teresa Shields within the next two months.   Teresa Shields [facility] Practice/[facility] Care Management [phone]

## 2020-07-16 DIAGNOSIS — N1832 Chronic kidney disease, stage 3b: Secondary | ICD-10-CM | POA: Diagnosis not present

## 2020-07-16 DIAGNOSIS — I129 Hypertensive chronic kidney disease with stage 1 through stage 4 chronic kidney disease, or unspecified chronic kidney disease: Secondary | ICD-10-CM | POA: Diagnosis not present

## 2020-07-16 DIAGNOSIS — M810 Age-related osteoporosis without current pathological fracture: Secondary | ICD-10-CM | POA: Diagnosis not present

## 2020-07-16 DIAGNOSIS — J309 Allergic rhinitis, unspecified: Secondary | ICD-10-CM | POA: Diagnosis not present

## 2020-07-16 DIAGNOSIS — E782 Mixed hyperlipidemia: Secondary | ICD-10-CM | POA: Diagnosis not present

## 2020-07-16 DIAGNOSIS — E1140 Type 2 diabetes mellitus with diabetic neuropathy, unspecified: Secondary | ICD-10-CM | POA: Diagnosis not present

## 2020-07-16 DIAGNOSIS — E1122 Type 2 diabetes mellitus with diabetic chronic kidney disease: Secondary | ICD-10-CM | POA: Diagnosis not present

## 2020-07-16 DIAGNOSIS — M109 Gout, unspecified: Secondary | ICD-10-CM | POA: Diagnosis not present

## 2020-07-16 DIAGNOSIS — M792 Neuralgia and neuritis, unspecified: Secondary | ICD-10-CM | POA: Diagnosis not present

## 2020-07-21 DIAGNOSIS — N1832 Chronic kidney disease, stage 3b: Secondary | ICD-10-CM | POA: Diagnosis not present

## 2020-07-21 DIAGNOSIS — I129 Hypertensive chronic kidney disease with stage 1 through stage 4 chronic kidney disease, or unspecified chronic kidney disease: Secondary | ICD-10-CM | POA: Diagnosis not present

## 2020-07-21 DIAGNOSIS — M109 Gout, unspecified: Secondary | ICD-10-CM | POA: Diagnosis not present

## 2020-07-21 DIAGNOSIS — E1140 Type 2 diabetes mellitus with diabetic neuropathy, unspecified: Secondary | ICD-10-CM | POA: Diagnosis not present

## 2020-07-21 DIAGNOSIS — J309 Allergic rhinitis, unspecified: Secondary | ICD-10-CM | POA: Diagnosis not present

## 2020-07-21 DIAGNOSIS — M810 Age-related osteoporosis without current pathological fracture: Secondary | ICD-10-CM | POA: Diagnosis not present

## 2020-07-21 DIAGNOSIS — E782 Mixed hyperlipidemia: Secondary | ICD-10-CM | POA: Diagnosis not present

## 2020-07-21 DIAGNOSIS — E1122 Type 2 diabetes mellitus with diabetic chronic kidney disease: Secondary | ICD-10-CM | POA: Diagnosis not present

## 2020-07-21 DIAGNOSIS — M792 Neuralgia and neuritis, unspecified: Secondary | ICD-10-CM | POA: Diagnosis not present

## 2020-07-26 DIAGNOSIS — N1832 Chronic kidney disease, stage 3b: Secondary | ICD-10-CM | POA: Diagnosis not present

## 2020-07-26 DIAGNOSIS — E1140 Type 2 diabetes mellitus with diabetic neuropathy, unspecified: Secondary | ICD-10-CM | POA: Diagnosis not present

## 2020-07-26 DIAGNOSIS — M810 Age-related osteoporosis without current pathological fracture: Secondary | ICD-10-CM | POA: Diagnosis not present

## 2020-07-26 DIAGNOSIS — M109 Gout, unspecified: Secondary | ICD-10-CM | POA: Diagnosis not present

## 2020-07-26 DIAGNOSIS — J309 Allergic rhinitis, unspecified: Secondary | ICD-10-CM | POA: Diagnosis not present

## 2020-07-26 DIAGNOSIS — E1122 Type 2 diabetes mellitus with diabetic chronic kidney disease: Secondary | ICD-10-CM | POA: Diagnosis not present

## 2020-07-26 DIAGNOSIS — M792 Neuralgia and neuritis, unspecified: Secondary | ICD-10-CM | POA: Diagnosis not present

## 2020-07-26 DIAGNOSIS — E782 Mixed hyperlipidemia: Secondary | ICD-10-CM | POA: Diagnosis not present

## 2020-07-26 DIAGNOSIS — I129 Hypertensive chronic kidney disease with stage 1 through stage 4 chronic kidney disease, or unspecified chronic kidney disease: Secondary | ICD-10-CM | POA: Diagnosis not present

## 2020-08-03 DIAGNOSIS — M810 Age-related osteoporosis without current pathological fracture: Secondary | ICD-10-CM | POA: Diagnosis not present

## 2020-08-03 DIAGNOSIS — I129 Hypertensive chronic kidney disease with stage 1 through stage 4 chronic kidney disease, or unspecified chronic kidney disease: Secondary | ICD-10-CM | POA: Diagnosis not present

## 2020-08-03 DIAGNOSIS — E782 Mixed hyperlipidemia: Secondary | ICD-10-CM | POA: Diagnosis not present

## 2020-08-03 DIAGNOSIS — M792 Neuralgia and neuritis, unspecified: Secondary | ICD-10-CM | POA: Diagnosis not present

## 2020-08-03 DIAGNOSIS — N1832 Chronic kidney disease, stage 3b: Secondary | ICD-10-CM | POA: Diagnosis not present

## 2020-08-03 DIAGNOSIS — J309 Allergic rhinitis, unspecified: Secondary | ICD-10-CM | POA: Diagnosis not present

## 2020-08-03 DIAGNOSIS — E1140 Type 2 diabetes mellitus with diabetic neuropathy, unspecified: Secondary | ICD-10-CM | POA: Diagnosis not present

## 2020-08-03 DIAGNOSIS — M109 Gout, unspecified: Secondary | ICD-10-CM | POA: Diagnosis not present

## 2020-08-03 DIAGNOSIS — E1122 Type 2 diabetes mellitus with diabetic chronic kidney disease: Secondary | ICD-10-CM | POA: Diagnosis not present

## 2020-08-11 DIAGNOSIS — I129 Hypertensive chronic kidney disease with stage 1 through stage 4 chronic kidney disease, or unspecified chronic kidney disease: Secondary | ICD-10-CM | POA: Diagnosis not present

## 2020-08-11 DIAGNOSIS — N1832 Chronic kidney disease, stage 3b: Secondary | ICD-10-CM | POA: Diagnosis not present

## 2020-08-11 DIAGNOSIS — J309 Allergic rhinitis, unspecified: Secondary | ICD-10-CM | POA: Diagnosis not present

## 2020-08-11 DIAGNOSIS — M109 Gout, unspecified: Secondary | ICD-10-CM | POA: Diagnosis not present

## 2020-08-11 DIAGNOSIS — E1122 Type 2 diabetes mellitus with diabetic chronic kidney disease: Secondary | ICD-10-CM | POA: Diagnosis not present

## 2020-08-11 DIAGNOSIS — E782 Mixed hyperlipidemia: Secondary | ICD-10-CM | POA: Diagnosis not present

## 2020-08-11 DIAGNOSIS — M810 Age-related osteoporosis without current pathological fracture: Secondary | ICD-10-CM | POA: Diagnosis not present

## 2020-08-11 DIAGNOSIS — E1140 Type 2 diabetes mellitus with diabetic neuropathy, unspecified: Secondary | ICD-10-CM | POA: Diagnosis not present

## 2020-08-11 DIAGNOSIS — M792 Neuralgia and neuritis, unspecified: Secondary | ICD-10-CM | POA: Diagnosis not present

## 2020-08-24 ENCOUNTER — Telehealth: Payer: Self-pay

## 2020-08-24 NOTE — Telephone Encounter (Signed)
°  Chronic Care Management   Outreach Note  08/24/2020 Name: Teresa Shields MRN: [id number] DOB: [birth year]  Primary Care Provider: [name], MD Reason for referral : Chronic Care Management     An unsuccessful telephone outreach was attempted today. She is currently engaged with the chronic care management team.   A HIPAA compliant voice message was left today requesting a return call.     Follow Up Plan: A member of the care management team will reach out to Teresa Shields again within the next two weeks.    [facility] Care Management [facility] [phone]

## 2020-09-03 ENCOUNTER — Other Ambulatory Visit: Payer: Self-pay | Admitting: Family Medicine

## 2020-09-17 ENCOUNTER — Telehealth: Payer: Self-pay

## 2020-09-17 NOTE — Telephone Encounter (Signed)
°  Chronic Care Management   Outreach Note  09/17/2020 Name: Teresa Shields MRN: [id number] DOB: Apr 19, 1936  Primary Care Provider: [name], MD Reason for referral : Chronic Care Management    An unsuccessful telephone outreach was attempted today. She is currently engaged with the chronic care management team.     Follow Up Plan:  Call dropped while attempted to leave a voice message today. 2nd and 3rd attempts were also unsuccessful.  A member of the care management team will attempt to reach [name] again within the next two weeks.     [facility] Care Management [facility] [phone]

## 2020-09-24 ENCOUNTER — Ambulatory Visit: Payer: Self-pay | Admitting: Family Medicine

## 2020-09-24 NOTE — Progress Notes (Signed)
Established patient visit   Patient: Teresa Shields   DOB: 08-15-1936   84 y.o. Female  MRN: [id number] Visit Date: 09/27/2020  Today's healthcare provider: Lelon Huh, MD   Chief Complaint  Patient presents with  . Diabetes   Subjective    HPI  Diabetes Mellitus Type II, Follow-up  Lab Results  Component Value Date   HGBA1C 7.4 (A) 06/23/2020   HGBA1C 7.5 (A) 03/22/2020   HGBA1C 6.8 (A) 09/17/2019   Wt Readings from Last 3 Encounters:  09/27/20 166 lb 6.4 oz (75.5 kg)  06/23/20 161 lb (73 kg)  03/22/20 163 lb (73.9 kg)   Last seen for diabetes 3 months ago.  Management since then includes Consider metformin if eGFR improves.  . She reports good compliance with treatment. She is not having side effects.  Symptoms: No fatigue No foot ulcerations  No appetite changes No nausea  No paresthesia of the feet  No polydipsia  No polyuria No visual disturbances   No vomiting     Home blood sugar records: fasting range: patient is not checking  Episodes of hypoglycemia? No    Current insulin regiment: None Most Recent Eye Exam: not UTD  Current exercise: walking Current diet habits: in general, a "healthy" diet    Pertinent Labs: Lab Results  Component Value Date   CHOL 146 03/22/2020   HDL 34 (L) 03/22/2020   LDLCALC 52 03/22/2020   TRIG 398 (H) 03/22/2020   CHOLHDL 4.3 03/22/2020   Lab Results  Component Value Date   NA 141 03/22/2020   K 3.9 03/22/2020   CREATININE 1.27 (H) 03/22/2020   GFRNONAA 40 (L) 03/22/2020   GFRAA 46 (L) 03/22/2020   GLUCOSE 189 (H) 03/22/2020     --------------------------------------------------------------------------------------------------- She has also completed PT for balance and again training and states it has helped. However she does occasionally feel like her left knee gives out. She has a little arthritis pain in it. She has been walking for exercise up to 30 minutes several days a week which she is doing well  with.      Medications: Outpatient Medications Prior to Visit  Medication Sig  . acetaminophen (TYLENOL) 500 MG tablet Take 1,000 mg by mouth 2 (two) times daily as needed (for pain.).  Marland Kitchen allopurinol (ZYLOPRIM) 300 MG tablet TAKE 1 TABLET EVERY DAY  . Ascorbic Acid (VITAMIN C) 1000 MG tablet Take 1,000 mg by mouth daily.  Marland Kitchen aspirin EC 81 MG tablet Take 81 mg by mouth daily.  Marland Kitchen atorvastatin (LIPITOR) 40 MG tablet TAKE 1 TABLET EVERY EVENING  . calcium-vitamin D (OSCAL 500/200 D-3) 500-200 MG-UNIT per tablet Take 1 tablet by mouth daily with breakfast.   . colchicine 0.6 MG tablet 2 tablets at first sign of gout, then one daily as needed  . HYDROcodone-acetaminophen (NORCO/VICODIN) 5-325 MG tablet Take 1-2 tablets by mouth every 4 (four) hours as needed for moderate pain.  . indomethacin (INDOCIN) 25 MG capsule Take 1-2 capsules (25-50 mg total) by mouth 2 (two) times daily with a meal. For gout (Patient taking differently: Take 25-50 mg by mouth 2 (two) times daily as needed (for gout flare ups). )  . metoprolol succinate (TOPROL-XL) 50 MG 24 hr tablet TAKE 1 TABLET EVERY DAY FOR HIGH BLOOD PRESSURE  . Multiple Vitamin (MULTIVITAMIN WITH MINERALS) TABS tablet Take 1 tablet by mouth daily.  . Omega-3 Fatty Acids (FISH OIL) 1000 MG CAPS Take 1,000 mg by mouth daily.  Marland Kitchen  pregabalin (LYRICA) 50 MG capsule Take 1 capsule (50 mg total) by mouth 3 (three) times daily.  Marland Kitchen triamterene-hydrochlorothiazide (MAXZIDE) 75-50 MG tablet TAKE 1 TABLET EVERY DAY  . Vitamins/Minerals TABS Take 1 tablet by mouth daily.    No facility-administered medications prior to visit.       Objective    BP (!) 154/69 (BP Location: Left Arm, Patient Position: Sitting, Cuff Size: Normal)   Pulse 67   Temp 98.3 F (36.8 C) (Oral)   Ht _0  (1.676 m)   Wt 166 lb 6.4 oz (75.5 kg)   SpO2 100%   BMI 26.86 kg/m    Physical Exam   General appearance:  Overweight female, cooperative and in no acute distress Head:  Normocephalic, without obvious abnormality, atraumatic Respiratory: Respirations even and unlabored, normal respiratory rate Extremities: All extremities are intact.  Skin: Skin color, texture, turgor normal. No rashes seen  Psych: Appropriate mood and affect. Neurologic: Mental status: Alert, oriented to person, place, and time, thought content appropriate.   Results for orders placed or performed in visit on 09/27/20  POCT HgB A1C  Result Value Ref Range   Hemoglobin A1C 7.4 (A) 4.0 - 5.6 %   Est. average glucose Bld gHb Est-mCnc      Assessment & Plan     1. Type 2 diabetes mellitus with diabetic neuropathy, without long-term current use of insulin (HCC) Stable, now exercising more consistently. No medications for now. Will recheck a1c in about 3 months.  - pregabalin (LYRICA) 50 MG capsule; Take 1 capsule (50 mg total) by mouth 3 (three) times daily.  Dispense: 270 capsule; Refill: 1  2. Hypertension, essential, benign Usually well controlled. She states she hasn't taken her regularly medications yet today. Continue current medications.    3. Stage 3b chronic kidney disease ([place]) Was advised after previous visit to increase water consumption.  - VITAMIN D 25 Hydroxy (Vit-D Deficiency, Fractures) - Renal function panel - PTH, Intact and Calcium  4. Chronic pain of left knee Recommend she wear an elastic knee brace whenever she is standing or ambulating.   5. Neuralgia She feels Lyrica is working better than gabapentin. Continue current dosing regiment.   6. Need for influenza vaccination  - Flu Vaccine QUAD High Dose(Fluad)    Will schedule follow up after reviewing labs.       The entirety of the information documented in the History of Present Illness, Review of Systems and Physical Exam were personally obtained by me. Portions of this information were initially documented by the CMA and reviewed by me for thoroughness and accuracy.      Lelon Huh, MD    [facility] [phone] (phone) [phone] (fax)  [place]

## 2020-09-27 ENCOUNTER — Other Ambulatory Visit: Payer: Self-pay

## 2020-09-27 ENCOUNTER — Ambulatory Visit (INDEPENDENT_AMBULATORY_CARE_PROVIDER_SITE_OTHER): Payer: Medicare HMO | Admitting: Family Medicine

## 2020-09-27 ENCOUNTER — Encounter: Payer: Self-pay | Admitting: Family Medicine

## 2020-09-27 VITALS — BP 154/69 | HR 67 | Temp 98.3°F | Ht 66.0 in | Wt 166.4 lb

## 2020-09-27 DIAGNOSIS — I10 Essential (primary) hypertension: Secondary | ICD-10-CM

## 2020-09-27 DIAGNOSIS — N1832 Chronic kidney disease, stage 3b: Secondary | ICD-10-CM | POA: Diagnosis not present

## 2020-09-27 DIAGNOSIS — M792 Neuralgia and neuritis, unspecified: Secondary | ICD-10-CM | POA: Diagnosis not present

## 2020-09-27 DIAGNOSIS — Z23 Encounter for immunization: Secondary | ICD-10-CM | POA: Diagnosis not present

## 2020-09-27 DIAGNOSIS — G8929 Other chronic pain: Secondary | ICD-10-CM | POA: Diagnosis not present

## 2020-09-27 DIAGNOSIS — E1140 Type 2 diabetes mellitus with diabetic neuropathy, unspecified: Secondary | ICD-10-CM

## 2020-09-27 DIAGNOSIS — M25562 Pain in left knee: Secondary | ICD-10-CM | POA: Diagnosis not present

## 2020-09-27 LAB — POCT GLYCOSYLATED HEMOGLOBIN (HGB A1C): Hemoglobin A1C: 7.4 % — AB (ref 4.0–5.6)

## 2020-09-27 MED ORDER — PREGABALIN 50 MG PO CAPS: 50.0000 mg | ORAL_CAPSULE | Freq: Three times a day (TID) | ORAL | 1 refills | Status: DC

## 2020-09-29 LAB — RENAL FUNCTION PANEL
Albumin: 4.3 g/dL (ref 3.6–4.6)
BUN/Creatinine Ratio: 21 (ref 12–28)
BUN: 31 mg/dL — ABNORMAL HIGH (ref 8–27)
CO2: 24 mmol/L (ref 20–29)
Calcium: 10.1 mg/dL (ref 8.7–10.3)
Chloride: 101 mmol/L (ref 96–106)
Creatinine, Ser: 1.51 mg/dL — ABNORMAL HIGH (ref 0.57–1.00)
GFR calc Af Amer: 37 mL/min/{1.73_m2} — ABNORMAL LOW (ref >59)
GFR calc non Af Amer: 32 mL/min/{1.73_m2} — ABNORMAL LOW (ref >59)
Glucose: 180 mg/dL — ABNORMAL HIGH (ref 65–99)
Phosphorus: 3.4 mg/dL (ref 3.0–4.3)
Potassium: 3.4 mmol/L — ABNORMAL LOW (ref 3.5–5.2)
Sodium: 144 mmol/L (ref 134–144)

## 2020-09-29 LAB — PTH, INTACT AND CALCIUM: PTH: 43 pg/mL (ref 15–65)

## 2020-09-29 LAB — VITAMIN D 25 HYDROXY (VIT D DEFICIENCY, FRACTURES): Vit D, 25-Hydroxy: 34.2 ng/mL (ref 30.0–100.0)

## 2020-10-14 ENCOUNTER — Telehealth: Payer: Self-pay | Admitting: Family Medicine

## 2020-10-14 MED ORDER — ALLOPURINOL 300 MG PO TABS: 300.0000 mg | ORAL_TABLET | Freq: Every day | ORAL | 0 refills | Status: DC

## 2020-10-14 NOTE — Telephone Encounter (Signed)
Patient of Dr. Sherrie Mustache , please review and advise if appropriate to refill. KW

## 2020-10-14 NOTE — Telephone Encounter (Signed)
Reviewed labs. Ok to refill GFR over 30  Keep follow up appointment with Sherrie Mustache [name], MD PCP advised.

## 2020-10-14 NOTE — Telephone Encounter (Signed)
[facility] faxed refill request for the following medications:  allopurinol (ZYLOPRIM) 300 MG tablet  90 day supply  Last Rx: 07/01/2020 LOV: 09/27/2020 Please advise. Thanks TNP

## 2020-12-09 ENCOUNTER — Telehealth: Payer: Self-pay

## 2020-12-09 NOTE — Telephone Encounter (Signed)
°  Chronic Care Management   Outreach Note  12/09/2020 Name: PRESTON WEILL MRN: [id number] DOB: 07-Dec-1936  Primary Care Provider: [name], MD Reason for referral: Chronic Care Provider   An unsuccessful telephone outreach was attempted today.  Ms. Lobban is enrolled in the chronic care management program.     Follow Up Plan:  Unable to connect with Ms. Starkes today due to multiple call drops. A member of the care management team will attempt another outreach within the next two weeks.    [facility] Care Management [facility] [phone]

## 2020-12-11 ENCOUNTER — Other Ambulatory Visit: Payer: Self-pay | Admitting: Adult Health

## 2021-01-27 ENCOUNTER — Telehealth: Payer: Self-pay

## 2021-01-31 ENCOUNTER — Telehealth: Payer: Self-pay

## 2021-01-31 NOTE — Telephone Encounter (Signed)
  Chronic Care Management   Outreach Note  01/31/2021 Name: Teresa Shields MRN: [id number] DOB: 12/16/1935  Primary Care Provider: [name], MD Reason for referral : Chronic Care Management   An unsuccessful telephone outreach was attempted today. She is currently enrolled in the chronic care management program.    Follow Up Plan:  A HIPAA compliant voice message was left today requesting a return call.   [facility] Care Management [facility] [phone]

## 2021-02-08 ENCOUNTER — Telehealth: Payer: Self-pay

## 2021-02-08 NOTE — Telephone Encounter (Signed)
  Chronic Care Management   Outreach Note  02/08/2021 Name: Teresa Shields MRN: [id number] DOB: 07/31/1936  Primary Care Provider: [name], MD Reason for referral : Chronic Care Management   An unsuccessful telephone outreach was attempted today. Teresa Shields is currently enrolled in the Chronic Care Management program. Since the last outreach attempt, she has contacted the team. A routine telephonic outreach was scheduled for today.   Follow Up Plan:  Unable to leave a voice message due to the voice mailbox being full. A member of the care management team will attempt to reach Teresa Shields again within the next week.    [facility] Care [facility] [phone]

## 2021-02-15 ENCOUNTER — Ambulatory Visit (INDEPENDENT_AMBULATORY_CARE_PROVIDER_SITE_OTHER): Payer: Medicare HMO

## 2021-02-15 DIAGNOSIS — I10 Essential (primary) hypertension: Secondary | ICD-10-CM | POA: Diagnosis not present

## 2021-02-15 DIAGNOSIS — E1140 Type 2 diabetes mellitus with diabetic neuropathy, unspecified: Secondary | ICD-10-CM | POA: Diagnosis not present

## 2021-02-15 DIAGNOSIS — E782 Mixed hyperlipidemia: Secondary | ICD-10-CM | POA: Diagnosis not present

## 2021-02-15 NOTE — Chronic Care Management (AMB) (Signed)
Chronic Care Management   Follow Up Note   02/15/2021 Name: Teresa Shields MRN: [id number] DOB: August 27, 1936  Primary Care Provider: Birdie Sons, MD Reason for referral : Chronic Care Management   Teresa Shields is a 85 y.o. year old female who is a primary care patient of Teresa Sons, MD. She is currently enrolled in the Chronic Care Management program.  Review of Teresa Shields's status, including review of consultants reports, relevant labs and test results was conducted today. Collaboration with appropriate care team members was performed as part of the comprehensive evaluation and provision of chronic care management services.    SDOH (Social Determinants of Health) assessments performed: No   Outpatient Encounter Medications as of 02/15/2021  Medication Sig  . acetaminophen (TYLENOL) 500 MG tablet Take 1,000 mg by mouth 2 (two) times daily as needed (for pain.).  Marland Kitchen allopurinol (ZYLOPRIM) 300 MG tablet TAKE 1 TABLET EVERY DAY  . Ascorbic Acid (VITAMIN C) 1000 MG tablet Take 1,000 mg by mouth daily.  Marland Kitchen aspirin EC 81 MG tablet Take 81 mg by mouth daily.  Marland Kitchen atorvastatin (LIPITOR) 40 MG tablet TAKE 1 TABLET EVERY EVENING  . calcium-vitamin D (OSCAL 500/200 D-3) 500-200 MG-UNIT per tablet Take 1 tablet by mouth daily with breakfast.   . colchicine 0.6 MG tablet 2 tablets at first sign of gout, then one daily as needed  . HYDROcodone-acetaminophen (NORCO/VICODIN) 5-325 MG tablet Take 1-2 tablets by mouth every 4 (four) hours as needed for moderate pain.  . indomethacin (INDOCIN) 25 MG capsule Take 1-2 capsules (25-50 mg total) by mouth 2 (two) times daily with a meal. For gout (Patient taking differently: Take 25-50 mg by mouth 2 (two) times daily as needed (for gout flare ups). )  . metoprolol succinate (TOPROL-XL) 50 MG 24 hr tablet TAKE 1 TABLET EVERY DAY FOR HIGH BLOOD PRESSURE  . Multiple Vitamin (MULTIVITAMIN WITH MINERALS) TABS tablet Take 1 tablet by mouth daily.  . Omega-3  Fatty Acids (FISH OIL) 1000 MG CAPS Take 1,000 mg by mouth daily.  . pregabalin (LYRICA) 50 MG capsule Take 1 capsule (50 mg total) by mouth 3 (three) times daily.  Marland Kitchen triamterene-hydrochlorothiazide (MAXZIDE) 75-50 MG tablet TAKE 1 TABLET EVERY DAY  . Vitamins/Minerals TABS Take 1 tablet by mouth daily.    No facility-administered encounter medications on file as of 02/15/2021.     Objective:  Patient Care Plan: Diabetes Type 2 (Adult)    Problem Identified: Disease Progression (Diabetes, Type 2)     Long-Range Goal: Disease Progression Prevented or Minimized   Start Date: 02/15/2021  Expected End Date: 06/15/2021  Priority: High  Note:   Objective:  Lab Results  Component Value Date   HGBA1C 7.4 (A) 09/27/2020 .   Lab Results  Component Value Date   CREATININE 1.51 (H) 09/27/2020   CREATININE 1.27 (H) 03/22/2020   CREATININE 1.09 (H) 09/17/2019 .   Marland Kitchen No results found for: EGFR   Current Barriers:  . Chronic Disease Management support and educational needs r/t Diabetes self-management.  Case Manager Clinical Goal(s):  Marland Kitchen Over the next 120 days, patient will demonstrate improved adherence to prescribed treatment plan for Diabetes self management as evidenced by adherence to ADA/ carb modified diet.  Interventions:  . Collaboration with Teresa Sons, MD regarding development and update of comprehensive plan of care as evidenced by provider attestation and co-signature . Inter-disciplinary care team collaboration (see longitudinal plan of care) . Discussed current plan for Diabetes  management. Reports not monitoring her blood glucose. Currently attempting to control her diet. She reports good nutritional intake. Declines need for nutritional resources. Her A1C is currently not at goal. She is aware of her provider's plan to consider Metformin.  . Discussed importance of completing recommended DM preventive care.    Patient Goals/Self-Care Activities Over the next 120 days,  patient will:  -Continue compliance with ADA/carb modified diet.  -Attend all scheduled provider appointments -Complete labs as scheduled -Notify provider or care management team with health related questions and concerns as needed    Follow Up Plan:  Will follow up next month   Patient Care Plan: Hypertension and Hyperlipidemia    Problem Identified: Hypertension (Hypertension)     Long-Range Goal: Hypertension and Hyperlipidemia monitored   Start Date: 02/15/2021  Expected End Date: 06/15/2021  Priority: High  Note:   Objective:  . Last practice recorded BP readings:  BP Readings from Last 3 Encounters:  09/27/20 (!) 154/69  06/23/20 119/70  03/22/20 126/62   . Most recent eGFR/CrCl: No results found for: EGFR  No components found for: CRCL  Lab Results  Component Value Date   CHOL 146 03/22/2020   HDL 34 (L) 03/22/2020   LDLCALC 52 03/22/2020   TRIG 398 (H) 03/22/2020   CHOLHDL 4.3 03/22/2020     Current Barriers:  . Chronic Disease Management support and educational needs r/t Hypertension and Hyperlipidemia  Case Manager Clinical Goal(s):  Marland Kitchen Over the next 120 days, patient will demonstrate improved adherence to prescribed treatment plan as evidenced by taking all medications as prescribed, monitoring, and recording blood pressure and adhering to a cardiac prudent/heart healthy diet.  Interventions:  . Collaboration with Teresa Sons, MD regarding development and update of comprehensive plan of care as evidenced by provider attestation and co-signature . Inter-disciplinary care team collaboration (see longitudinal plan of care) . Reviewed medications. Encouraged to continue taking as prescribed and notify provider if unable to tolerate prescribed regimen.  . Provided information regarding established blood pressure parameters along with indications for notifying a provider. Reports home readings have been within range. Encouraged to continue monitoring and record  readings. . Discussed activity level and compliance with recommended cardiac prudent diet. Activity is limited d/t impaired gait and balance. She is attempting to adhere to a cardiac/prudent heart healthy diet. Encouraged to continue reading nutritional labels and monitoring sodium intake.  Marland Kitchen Discussed complications of uncontrolled blood pressure. Reviewed s/sx of heart attack, stroke and worsening symptoms that require immediate medical attention.     Patient Goals/Self-Care Activities: -Self-administer medications as prescribed -Attend all scheduled provider appointments -Monitor and record blood pressure -Adhere to recommended cardiac prudent/heart healthy diet -Notify provider or care management team with questions and new concerns as needed   Follow Up Plan:  Will follow up next month    Patient Care Plan: Fall Risk (Adult)    Problem Identified: Fall Risk     Long-Range Goal: Absence of Fall and Fall-Related Injury   Start Date: 02/15/2021  Expected End Date: 04/16/2021  Priority: High  Note:   Fall Risk  02/15/2021 05/04/2020 03/18/2020 07/26/2018 01/15/2018  Falls in the past year? 0 0 0 Yes No  Number falls in past yr: 0 - 0 2 or more -  Injury with Fall? - - 0 Yes -  Risk for fall due to : Impaired balance/gait;Other (Comment) Impaired balance/gait - - -  Risk for fall due to: Comment Reports increased gout flares over the past  month - - - -  Follow up Falls prevention discussed Falls prevention discussed - Falls prevention discussed -    Current Barriers:  Marland Kitchen Moderate Risk for Falls d/t Impaired gait/balance.  Clinical Goal(s):  Marland Kitchen Over the next 120 days, patient will not experience falls or require emergent treatment d/t fall related injuries.  Interventions:  . Collaboration with Teresa Sons, MD regarding development and update of comprehensive plan of care as evidenced by provider attestation and co-signature . Inter-disciplinary care team collaboration (see  longitudinal plan of care) . Reviewed medications and discussed potential side effects such as lightheadedness and dizziness. Encouraged to notify Dr. Caryn Section if unable to tolerate prescribed regimen. . Discussed safety and fall prevention measures. Encouraged  to continue using her cane or walker when ambulating. Advised to avoid overreaching and avoid attempting to lift weighted objects d/t impaired balance. . Discussed ability to perform ADL's and tasks in the home. Discussed possible need for in-home assistance. Declines current need for additional assistance. Good family support. Daughter is available to assist and provide transportation. Agreed to update if additional assistance is needed.   Self-Care Deficits/Patient Goals:  Over the next 120 days, patient will: -Utilize assistive device appropriately with all ambulation -Use caution when standing and changing positions -Wear secure fitting non skid footwear when ambulating -Ensure pathways are clear and well lit -Contact provider or the care management team with questions and new concerns   Follow Up Plan:  Will follow up next month       PLAN A member of the care management team will follow up with Mrs. Noell next month.     [facility] Care [facility] [phone]

## 2021-02-18 NOTE — Patient Instructions (Signed)
Thank you for allowing the Chronic Care Management team to participate in your care.   Patient Care Plan: Diabetes Type 2 (Adult)    Problem Identified: Disease Progression (Diabetes, Type 2)     Long-Range Goal: Disease Progression Prevented or Minimized   Start Date: 02/15/2021  Expected End Date: 06/15/2021  Priority: High  Note:   Objective:  Lab Results  Component Value Date   HGBA1C 7.4 (A) 09/27/2020 .   Lab Results  Component Value Date   CREATININE 1.51 (H) 09/27/2020   CREATININE 1.27 (H) 03/22/2020   CREATININE 1.09 (H) 09/17/2019 .   Marland Kitchen No results found for: EGFR   Current Barriers:  . Chronic Disease Management support and educational needs r/t Diabetes self-management.  Case Manager Clinical Goal(s):  Marland Kitchen Over the next 120 days, patient will demonstrate improved adherence to prescribed treatment plan for Diabetes self management as evidenced by adherence to ADA/ carb modified diet.  Interventions:  . Collaboration with Teresa Sons, MD regarding development and update of comprehensive plan of care as evidenced by provider attestation and co-signature . Inter-disciplinary care team collaboration (see longitudinal plan of care) . Discussed current plan for Diabetes management. Reports not monitoring her blood glucose. Currently attempting to control her diet. She reports good nutritional intake. Declines need for nutritional resources. Her A1C is currently not at goal. She is aware of her provider's plan to consider Metformin.  . Discussed importance of completing recommended DM preventive care.    Patient Goals/Self-Care Activities Over the next 120 days, patient will:  -Continue compliance with ADA/carb modified diet.  -Attend all scheduled provider appointments -Complete labs as scheduled -Notify provider or care management team with health related questions and concerns as needed    Follow Up Plan:  Will follow up next month   Patient Care Plan:  Hypertension and Hyperlipidemia    Problem Identified: Hypertension (Hypertension)     Long-Range Goal: Hypertension and Hyperlipidemia monitored   Start Date: 02/15/2021  Expected End Date: 06/15/2021  Priority: High  Note:   Objective:  . Last practice recorded BP readings:  BP Readings from Last 3 Encounters:  09/27/20 (!) 154/69  06/23/20 119/70  03/22/20 126/62   . Most recent eGFR/CrCl: No results found for: EGFR  No components found for: CRCL  Lab Results  Component Value Date   CHOL 146 03/22/2020   HDL 34 (L) 03/22/2020   LDLCALC 52 03/22/2020   TRIG 398 (H) 03/22/2020   CHOLHDL 4.3 03/22/2020     Current Barriers:  . Chronic Disease Management support and educational needs r/t Hypertension and Hyperlipidemia  Case Manager Clinical Goal(s):  Marland Kitchen Over the next 120 days, patient will demonstrate improved adherence to prescribed treatment plan as evidenced by taking all medications as prescribed, monitoring, and recording blood pressure and adhering to a cardiac prudent/heart healthy diet.  Interventions:  . Collaboration with Teresa Sons, MD regarding development and update of comprehensive plan of care as evidenced by provider attestation and co-signature . Inter-disciplinary care team collaboration (see longitudinal plan of care) . Reviewed medications. Encouraged to continue taking as prescribed and notify provider if unable to tolerate prescribed regimen.  . Provided information regarding established blood pressure parameters along with indications for notifying a provider. Reports home readings have been within range. Encouraged to continue monitoring and record readings. . Discussed activity level and compliance with recommended cardiac prudent diet. Activity is limited d/t impaired gait and balance. She is attempting to adhere to a cardiac/prudent  heart healthy diet. Encouraged to continue reading nutritional labels and monitoring sodium intake.  Marland Kitchen Discussed  complications of uncontrolled blood pressure. Reviewed s/sx of heart attack, stroke and worsening symptoms that require immediate medical attention.   Patient Goals/Self-Care Activities: -Self-administer medications as prescribed -Attend all scheduled provider appointments -Monitor and record blood pressure -Adhere to recommended cardiac prudent/heart healthy diet -Notify provider or care management team with questions and new concerns as needed   Follow Up Plan:  Will follow up next month    Patient Care Plan: Fall Risk (Adult)    Problem Identified: Fall Risk     Long-Range Goal: Absence of Fall and Fall-Related Injury   Start Date: 02/15/2021  Expected End Date: 04/16/2021  Priority: High  Note:   Fall Risk  02/15/2021 05/04/2020 03/18/2020 07/26/2018 01/15/2018  Falls in the past year? 0 0 0 Yes No  Number falls in past yr: 0 - 0 2 or more -  Injury with Fall? - - 0 Yes -  Risk for fall due to : Impaired balance/gait;Other (Comment) Impaired balance/gait - - -  Risk for fall due to: Comment Reports increased gout flares over the past month - - - -  Follow up Falls prevention discussed Falls prevention discussed - Falls prevention discussed -    Current Barriers:  Marland Kitchen Moderate Risk for Falls d/t Impaired gait/balance.  Clinical Goal(s):  Marland Kitchen Over the next 120 days, patient will not experience falls or require emergent treatment d/t fall related injuries.  Interventions:  . Collaboration with Teresa Sons, MD regarding development and update of comprehensive plan of care as evidenced by provider attestation and co-signature . Inter-disciplinary care team collaboration (see longitudinal plan of care) . Reviewed medications and discussed potential side effects such as lightheadedness and dizziness. Encouraged to notify Dr. Caryn Section if unable to tolerate prescribed regimen. . Discussed safety and fall prevention measures. Encouraged  to continue using her cane or walker when ambulating.  Advised to avoid overreaching and avoid attempting to lift weighted objects d/t impaired balance. . Discussed ability to perform ADL's and tasks in the home. Discussed possible need for in-home assistance. Declines current need for additional assistance. Good family support. Daughter is available to assist and provide transportation. Agreed to update if additional assistance is needed.   Self-Care Deficits/Patient Goals:  Over the next 120 days, patient will: -Utilize assistive device appropriately with all ambulation -Use caution when standing and changing positions -Wear secure fitting non skid footwear when ambulating -Ensure pathways are clear and well lit -Contact provider or the care management team with questions and new concerns   Follow Up Plan:  Will follow up next month        Teresa Shields verbalized understanding of the information discussed during the telephonic outreach today. Declined need for mailed/printed instructions. A member of the care management team will follow up with Teresa Shields next month.   Teresa Shields [facility] Care [facility] [phone]

## 2021-02-23 ENCOUNTER — Other Ambulatory Visit: Payer: Self-pay | Admitting: Adult Health

## 2021-03-14 ENCOUNTER — Ambulatory Visit (INDEPENDENT_AMBULATORY_CARE_PROVIDER_SITE_OTHER): Payer: Medicare HMO | Admitting: Family Medicine

## 2021-03-14 ENCOUNTER — Other Ambulatory Visit: Payer: Self-pay

## 2021-03-14 VITALS — BP 159/56 | HR 66 | Ht 66.0 in | Wt 171.0 lb

## 2021-03-14 DIAGNOSIS — N183 Chronic kidney disease, stage 3 unspecified: Secondary | ICD-10-CM

## 2021-03-14 DIAGNOSIS — I10 Essential (primary) hypertension: Secondary | ICD-10-CM

## 2021-03-14 DIAGNOSIS — E782 Mixed hyperlipidemia: Secondary | ICD-10-CM | POA: Diagnosis not present

## 2021-03-14 DIAGNOSIS — E1140 Type 2 diabetes mellitus with diabetic neuropathy, unspecified: Secondary | ICD-10-CM

## 2021-03-14 LAB — POCT GLYCOSYLATED HEMOGLOBIN (HGB A1C)
Estimated Average Glucose: 174
Hemoglobin A1C: 7.7 % — AB (ref 4.0–5.6)

## 2021-03-14 MED ORDER — METFORMIN HCL ER 500 MG PO TB24: 500.0000 mg | ORAL_TABLET | Freq: Every day | ORAL | 4 refills | Status: DC

## 2021-03-14 MED ORDER — PREGABALIN 75 MG PO CAPS: 75.0000 mg | ORAL_CAPSULE | Freq: Three times a day (TID) | ORAL | 1 refills | Status: DC

## 2021-03-14 NOTE — Progress Notes (Signed)
Established patient visit   Patient: Teresa Shields   DOB: 1936-07-31   85 y.o. Female  MRN: [id number] Visit Date: 03/14/2021  Today's healthcare provider: Mila Merry, MD   Chief Complaint  Patient presents with  . Diabetes  . Hypertension   Subjective    HPI  Diabetes Mellitus Type II, Follow-up  Lab Results  Component Value Date   HGBA1C 7.7 (A) 03/14/2021   HGBA1C 7.4 (A) 09/27/2020   HGBA1C 7.4 (A) 06/23/2020   Wt Readings from Last 3 Encounters:  03/14/21 171 lb (77.6 kg)  09/27/20 166 lb 6.4 oz (75.5 kg)  06/23/20 161 lb (73 kg)   Last seen for diabetes 6 months ago.  Management since then includes none; No medications for now. Stable, now exercising more consistently. She reports n/a compliance with treatment. She is not having side effects.  Symptoms: No fatigue No foot ulcerations  No appetite changes No nausea  Yes paresthesia of the feet  No polydipsia  No polyuria Yes visual disturbances   No vomiting     Home blood sugar records: n/a  Episodes of hypoglycemia? No    Current insulin regiment:   Current exercise: walking Current diet habits: in general, a "healthy" diet    Pertinent Labs: Lab Results  Component Value Date   CHOL 146 03/22/2020   HDL 34 (L) 03/22/2020   LDLCALC 52 03/22/2020   TRIG 398 (H) 03/22/2020   CHOLHDL 4.3 03/22/2020   Lab Results  Component Value Date   NA 144 09/27/2020   K 3.4 (L) 09/27/2020   CREATININE 1.51 (H) 09/27/2020   GFRNONAA 32 (L) 09/27/2020   GFRAA 37 (L) 09/27/2020   GLUCOSE 180 (H) 09/27/2020     --------------------------------------------------------------------------------------------------- Hypertension, follow-up  BP Readings from Last 3 Encounters:  03/14/21 (!) 159/56  09/27/20 (!) 154/69  06/23/20 119/70   Wt Readings from Last 3 Encounters:  03/14/21 171 lb (77.6 kg)  09/27/20 166 lb 6.4 oz (75.5 kg)  06/23/20 161 lb (73 kg)     She was last seen for hypertension 6  months ago.  BP at that visit was 154/69. Management since that visit includes none; continue current medications.  She reports excellent compliance with treatment. She is not having side effects.  She is following a Regular diet. She is exercising. She does not smoke.  Use of agents associated with hypertension: NSAIDS.   Outside blood pressures are 130s/70s. Symptoms: No chest pain No chest pressure  No palpitations No syncope  No dyspnea No orthopnea  No paroxysmal nocturnal dyspnea Yes lower extremity edema - swelling in feet   Pertinent labs: Lab Results  Component Value Date   CHOL 146 03/22/2020   HDL 34 (L) 03/22/2020   LDLCALC 52 03/22/2020   TRIG 398 (H) 03/22/2020   CHOLHDL 4.3 03/22/2020   Lab Results  Component Value Date   NA 144 09/27/2020   K 3.4 (L) 09/27/2020   CREATININE 1.51 (H) 09/27/2020   GFRNONAA 32 (L) 09/27/2020   GFRAA 37 (L) 09/27/2020   GLUCOSE 180 (H) 09/27/2020     The ASCVD Risk score Denman George DC Jr., et al., 2013) failed to calculate for the following reasons:   The 2013 ASCVD risk score is only valid for ages 42 to 5   ---------------------------------------------------------------------------------------------------      Medications: Outpatient Medications Prior to Visit  Medication Sig  . acetaminophen (TYLENOL) 500 MG tablet Take 1,000 mg by mouth 2 (two) times  daily as needed (for pain.).  Marland Kitchen allopurinol (ZYLOPRIM) 300 MG tablet TAKE 1 TABLET EVERY DAY  . Ascorbic Acid (VITAMIN C) 1000 MG tablet Take 1,000 mg by mouth daily.  Marland Kitchen aspirin EC 81 MG tablet Take 81 mg by mouth daily.  Marland Kitchen atorvastatin (LIPITOR) 40 MG tablet TAKE 1 TABLET EVERY EVENING  . calcium-vitamin D (OSCAL WITH D) 500-200 MG-UNIT tablet Take 1 tablet by mouth daily with breakfast.   . colchicine 0.6 MG tablet 2 tablets at first sign of gout, then one daily as needed  . HYDROcodone-acetaminophen (NORCO/VICODIN) 5-325 MG tablet Take 1-2 tablets by mouth every 4  (four) hours as needed for moderate pain.  . indomethacin (INDOCIN) 25 MG capsule Take 1-2 capsules (25-50 mg total) by mouth 2 (two) times daily with a meal. For gout (Patient taking differently: Take 25-50 mg by mouth 2 (two) times daily as needed (for gout flare ups).)  . metoprolol succinate (TOPROL-XL) 50 MG 24 hr tablet TAKE 1 TABLET EVERY DAY FOR HIGH BLOOD PRESSURE  . Multiple Vitamin (MULTIVITAMIN WITH MINERALS) TABS tablet Take 1 tablet by mouth daily.  . Omega-3 Fatty Acids (FISH OIL) 1000 MG CAPS Take 1,000 mg by mouth daily.  . pregabalin (LYRICA) 50 MG capsule Take 1 capsule (50 mg total) by mouth 3 (three) times daily.  Marland Kitchen triamterene-hydrochlorothiazide (MAXZIDE) 75-50 MG tablet TAKE 1 TABLET EVERY DAY  . Vitamins/Minerals TABS Take 1 tablet by mouth daily.    No facility-administered medications prior to visit.        Objective    BP (!) 159/56 (BP Location: Right Arm, Patient Position: Sitting, Cuff Size: Normal)   Pulse 66   Ht 5\' 6"  (1.676 m)   Wt 171 lb (77.6 kg)   SpO2 100%   BMI 27.60 kg/m     Physical Exam   General appearance:  Well developed, well nourished female, cooperative and in no acute distress Head: Normocephalic, without obvious abnormality, atraumatic Respiratory: Respirations even and unlabored, normal respiratory rate Extremities: All extremities are intact.  Skin: Skin color, texture, turgor normal. No rashes seen  Psych: Appropriate mood and affect. Neurologic: Mental status: Alert, oriented to person, place, and time, thought content appropriate.  Results for orders placed or performed in visit on 03/14/21  POCT glycosylated hemoglobin (Hb A1C)  Result Value Ref Range   Hemoglobin A1C 7.7 (A) 4.0 - 5.6 %   Estimated Average Glucose 174     Assessment & Plan     1. Type 2 diabetes mellitus with diabetic neuropathy, without long-term current use of insulin (HCC) A1c is steadily rising. Start  metFORMIN (GLUCOPHAGE-XR) 500 MG 24 hr  tablet; Take 1 tablet (500 mg total) by mouth daily.  Dispense: 90 tablet; Refill: 4. Follow up to check A1c in 3 months.   For neuropathy, increase pregabalin (LYRICA) 75 MG capsule; Take 1 capsule (75 mg total) by mouth 3 (three) times daily.  Dispense: 270 capsule; Refill: 1  - Comprehensive metabolic panel  2. Stage 3 chronic kidney disease, unspecified whether stage 3a or 3b CKD (HCC)  - Parathyroid hormone, intact (no Ca)  3. Hypertension, essential, benign She reports home systolic BP consistently 130-140 range. Continue current medications.    4. Hypercalcemia  - Parathyroid hormone, intact (no Ca)  5. Hyperlipidemia, mixed  She is tolerating atorvastatin well with no adverse effects.    - Lipid panel        The entirety of the information documented in the History of  Present Illness, Review of Systems and Physical Exam were personally obtained by me. Portions of this information were initially documented by the CMA and reviewed by me for thoroughness and accuracy.      Lelon Huh, MD  [facility] [phone] (phone) [phone] (fax)  [place]

## 2021-03-15 ENCOUNTER — Telehealth: Payer: Self-pay

## 2021-03-15 LAB — COMPREHENSIVE METABOLIC PANEL
ALT: 28 IU/L (ref 0–32)
AST: 22 IU/L (ref 0–40)
Albumin/Globulin Ratio: 2 (ref 1.2–2.2)
Albumin: 4.3 g/dL (ref 3.6–4.6)
Alkaline Phosphatase: 116 IU/L (ref 44–121)
BUN/Creatinine Ratio: 26 (ref 12–28)
BUN: 30 mg/dL — ABNORMAL HIGH (ref 8–27)
Bilirubin Total: 0.6 mg/dL (ref 0.0–1.2)
CO2: 22 mmol/L (ref 20–29)
Calcium: 9.8 mg/dL (ref 8.7–10.3)
Chloride: 100 mmol/L (ref 96–106)
Creatinine, Ser: 1.15 mg/dL — ABNORMAL HIGH (ref 0.57–1.00)
Globulin, Total: 2.1 g/dL (ref 1.5–4.5)
Glucose: 199 mg/dL — ABNORMAL HIGH (ref 65–99)
Potassium: 3.9 mmol/L (ref 3.5–5.2)
Sodium: 142 mmol/L (ref 134–144)
Total Protein: 6.4 g/dL (ref 6.0–8.5)
eGFR: 48 mL/min/{1.73_m2} — ABNORMAL LOW (ref >59)

## 2021-03-15 LAB — LIPID PANEL
Chol/HDL Ratio: 4.9 ratio — ABNORMAL HIGH (ref 0.0–4.4)
Cholesterol, Total: 167 mg/dL (ref 100–199)
HDL: 34 mg/dL — ABNORMAL LOW (ref >39)
LDL Chol Calc (NIH): 71 mg/dL (ref 0–99)
Triglycerides: 394 mg/dL — ABNORMAL HIGH (ref 0–149)
VLDL Cholesterol Cal: 62 mg/dL — ABNORMAL HIGH (ref 5–40)

## 2021-03-15 LAB — PARATHYROID HORMONE, INTACT (NO CA): PTH: 46 pg/mL (ref 15–65)

## 2021-03-15 NOTE — Telephone Encounter (Signed)
I called patient and patient verbalized understanding of information below.   

## 2021-03-18 ENCOUNTER — Ambulatory Visit (INDEPENDENT_AMBULATORY_CARE_PROVIDER_SITE_OTHER): Payer: Medicare HMO

## 2021-03-18 DIAGNOSIS — E782 Mixed hyperlipidemia: Secondary | ICD-10-CM

## 2021-03-18 DIAGNOSIS — I10 Essential (primary) hypertension: Secondary | ICD-10-CM | POA: Diagnosis not present

## 2021-03-18 DIAGNOSIS — E1140 Type 2 diabetes mellitus with diabetic neuropathy, unspecified: Secondary | ICD-10-CM

## 2021-03-18 DIAGNOSIS — R269 Unspecified abnormalities of gait and mobility: Secondary | ICD-10-CM

## 2021-03-18 NOTE — Chronic Care Management (AMB) (Signed)
Chronic Care Management   Follow Up Note   03/18/2021 Name: Teresa Shields MRN: [id number] DOB: [birth year]  Primary Care Provider: Birdie Sons, MD Reason for referral : Chronic Care Management   Teresa Shields is a 85 y.o. year old female who is a primary care patient of Birdie Sons, MD. Teresa Shields is currently enrolled in the Chronic Care Management program.  Review of Teresa Shields's status, including review of consultants reports, relevant labs and test results was conducted today. Collaboration with appropriate care team members was performed as part of the comprehensive evaluation and provision of chronic care management services.    SDOH (Social Determinants of Health) assessments performed: No    Outpatient Encounter Medications as of 03/18/2021  Medication Sig  . acetaminophen (TYLENOL) 500 MG tablet Take 1,000 mg by mouth 2 (two) times daily as needed (for pain.).  Marland Kitchen allopurinol (ZYLOPRIM) 300 MG tablet TAKE 1 TABLET EVERY DAY  . Ascorbic Acid (VITAMIN C) 1000 MG tablet Take 1,000 mg by mouth daily.  Marland Kitchen aspirin EC 81 MG tablet Take 81 mg by mouth daily.  Marland Kitchen atorvastatin (LIPITOR) 40 MG tablet TAKE 1 TABLET EVERY EVENING  . calcium-vitamin D (OSCAL WITH D) 500-200 MG-UNIT tablet Take 1 tablet by mouth daily with breakfast.   . colchicine 0.6 MG tablet 2 tablets at first sign of gout, then one daily as needed  . HYDROcodone-acetaminophen (NORCO/VICODIN) 5-325 MG tablet Take 1-2 tablets by mouth every 4 (four) hours as needed for moderate pain.  . indomethacin (INDOCIN) 25 MG capsule Take 1-2 capsules (25-50 mg total) by mouth 2 (two) times daily with a meal. For gout (Patient taking differently: Take 25-50 mg by mouth 2 (two) times daily as needed (for gout flare ups).)  . metFORMIN (GLUCOPHAGE-XR) 500 MG 24 hr tablet Take 1 tablet (500 mg total) by mouth daily.  . metoprolol succinate (TOPROL-XL) 50 MG 24 hr tablet TAKE 1 TABLET EVERY DAY FOR HIGH BLOOD PRESSURE  . Multiple  Vitamin (MULTIVITAMIN WITH MINERALS) TABS tablet Take 1 tablet by mouth daily.  . Omega-3 Fatty Acids (FISH OIL) 1000 MG CAPS Take 1,000 mg by mouth daily.  . pregabalin (LYRICA) 75 MG capsule Take 1 capsule (75 mg total) by mouth 3 (three) times daily.  Marland Kitchen triamterene-hydrochlorothiazide (MAXZIDE) 75-50 MG tablet TAKE 1 TABLET EVERY DAY  . Vitamins/Minerals TABS Take 1 tablet by mouth daily.    No facility-administered encounter medications on file as of 03/18/2021.     Objective:  Patient Care Plan: Diabetes Type 2 (Adult)    Problem Identified: Disease Progression (Diabetes, Type 2)     Long-Range Goal: Disease Progression Prevented or Minimized   Start Date: 02/15/2021  Expected End Date: 06/15/2021  Priority: High  Note:   Objective:   Lab Results  Component Value Date   HGBA1C 7.7 (A) 03/14/2021   Lab Results  Component Value Date   CREATININE 1.15 (H) 03/14/2021    No results found for: EGFR  Current Barriers:  . Chronic Disease Management support and educational needs r/t Diabetes self-management.  Case Manager Clinical Goal(s):  Marland Kitchen Over the next 120 days, patient will demonstrate improved adherence to prescribed treatment plan for Diabetes self management as evidenced by adherence to ADA/ carb modified diet.  Interventions:  . Collaboration with Birdie Sons, MD regarding development and update of comprehensive plan of care as evidenced by provider attestation and co-signature . Inter-disciplinary care team collaboration (see longitudinal plan of care) . Discussed current  plan for Diabetes management. Her recent A1C was not at goal. Metformin was ordered and Teresa Shields reports taking as prescribed. Reports very good nutritional intake. Teresa Shields currently does not monitor her blood glucose levels but will consider if her next A1C does not improve. We discussed s/sx of hypoglycemia and hyperglycemia along with appropriate interventions. . Discussed importance of completing recommended  DM preventive care. Encouraged to complete foot care as recommended. Teresa Shields is due for an eye exam and will contact her Optometrist to schedule.   Patient Goals/Self-Care Activities -Continue compliance with ADA/carb modified diet.  -Attend all scheduled provider appointments -Complete labs as scheduled -Notify provider or care management team with health related questions and concern as needed    Follow Up Plan:  Will follow up within three months.   Patient Care Plan: Hypertension and Hyperlipidemia    Problem Identified: Hypertension (Hypertension)     Long-Range Goal: Hypertension and Hyperlipidemia monitored   Start Date: 02/15/2021  Expected End Date: 06/15/2021  Priority: High  Note:   Objective:  . Last practice recorded BP readings:  BP Readings from Last 3 Encounters:  03/14/21 (!) 159/56  09/27/20 (!) 154/69  06/23/20 119/70    . Most recent eGFR/CrCl: No results found for: EGFR  No components found for: CRCL  Lab Results  Component Value Date   CHOL 167 03/14/2021   HDL 34 (L) 03/14/2021   LDLCALC 71 03/14/2021   TRIG 394 (H) 03/14/2021   CHOLHDL 4.9 (H) 03/14/2021     Current Barriers:  . Chronic Disease Management support and educational needs r/t Hypertension and Hyperlipidemia  Case Manager Clinical Goal(s):  Marland Kitchen Over the next 120 days, patient will demonstrate improved adherence to prescribed treatment plan as evidenced by taking all medications as prescribed, monitoring, and recording blood pressure and adhering to a cardiac prudent/heart healthy diet.  Interventions:  . Collaboration with Birdie Sons, MD regarding development and update of comprehensive plan of care as evidenced by provider attestation and co-signature . Inter-disciplinary care team collaboration (see longitudinal plan of care) . Reviewed compliance with current treatment plan. Reports excellent compliance with medications. Attempting to maintain a heart healthy diet. Teresa Shields is monitoring  her BP at home as recommended. Reports systolic readings in the 818'E. Reports diastolic readings in the 99'B to 70's. Reviewed parameters and indications for notifying a provider. Encouraged to continue monitoring and recording readings.  . Discussed complications of uncontrolled blood pressure. Reviewed s/sx of heart attack, stroke and worsening symptoms that require immediate medical attention.    Patient Goals/Self-Care Activities: -Self-administer medications as prescribed -Attend all scheduled provider appointments -Monitor and record blood pressure -Adhere to recommended cardiac prudent/heart healthy diet -Notify provider or care management team with questions and new concerns as needed   Follow Up Plan:  Will follow up in three months    Patient Care Plan: Fall Risk (Adult)    Problem Identified: Fall Risk     Long-Range Goal: Absence of Fall and Fall-Related Injury   Start Date: 02/15/2021  Expected End Date: 04/16/2021  Priority: High  Note:   Fall Risk  02/15/2021 05/04/2020 03/18/2020 07/26/2018 01/15/2018  Falls in the past year? 0 0 0 Yes No  Number falls in past yr: 0 - 0 2 or more -  Injury with Fall? - - 0 Yes -  Risk for fall due to : Impaired balance/gait;Other (Comment) Impaired balance/gait - - -  Risk for fall due to: Comment Reports increased gout flares over the past month - - - -  Follow up Falls prevention discussed Falls prevention discussed - Falls prevention discussed -    Current Barriers:  . Risk for Falls d/t Impaired gait/balance.  Clinical Goal(s):  Marland Kitchen Over the next 120 days, patient will not experience falls or require emergent treatment d/t fall related injuries.  Interventions:  . Collaboration with Birdie Sons, MD regarding development and update of comprehensive plan of care as evidenced by provider attestation and co-signature . Inter-disciplinary care team collaboration (see longitudinal plan of care) . Reviewed medications and discussed  potential side effects such as lightheadedness and dizziness. Encouraged to notify Dr. Caryn Section if unable to tolerate prescribed regimen. . Discussed safety and fall prevention measures. Reports balance has improved and Teresa Shields has been able to utilize the steps. Denies falls. No changes in activity tolerance. Encouraged to continue using her walker or cane as needed.   Self-Care Deficits/Patient Goals: -Utilize assistive device appropriately with all ambulation -Use caution when standing and changing positions -Wear secure fitting non skid footwear when ambulating -Ensure pathways are clear and well lit -Contact provider or the care management team with questions and new concerns  Plan Will follow up in three months      PLAN A member of the care management team will follow up with Ms. Junker in three months.    Cristy Friedlander [facility] Care Management [facility] [phone]

## 2021-03-21 NOTE — Patient Instructions (Addendum)
Thank you for allowing the Chronic Care Management team to participate in your care. It was a pleasure speaking with you today. Please feel free to contact me with questions.   Goals Addressed: Patient Care Plan: Diabetes Type 2 (Adult)    Problem Identified: Disease Progression (Diabetes, Type 2)     Long-Range Goal: Disease Progression Prevented or Minimized   Start Date: 02/15/2021  Expected End Date: 06/15/2021  Priority: High  Note:   Objective:   Lab Results  Component Value Date   HGBA1C 7.7 (A) 03/14/2021   Lab Results  Component Value Date   CREATININE 1.15 (H) 03/14/2021    No results found for: EGFR  Current Barriers:  . Chronic Disease Management support and educational needs r/t Diabetes self-management.  Case Manager Clinical Goal(s):  Marland Kitchen Over the next 120 days, patient will demonstrate improved adherence to prescribed treatment plan for Diabetes self management as evidenced by adherence to ADA/ carb modified diet.  Interventions:  . Collaboration with Birdie Sons, MD regarding development and update of comprehensive plan of care as evidenced by provider attestation and co-signature . Inter-disciplinary care team collaboration (see longitudinal plan of care) . Discussed current plan for Diabetes management. Her recent A1C was not at goal. Metformin was ordered and she reports taking as prescribed. Reports very good nutritional intake. She currently does not monitor her blood glucose levels but will consider if her next A1C does not improve. We discussed s/sx of hypoglycemia and hyperglycemia along with appropriate interventions. . Discussed importance of completing recommended DM preventive care. Encouraged to complete foot care as recommended. She is due for an eye exam and will contact her Optometrist to schedule.   Patient Goals/Self-Care Activities -Continue compliance with ADA/carb modified diet.  -Attend all scheduled provider appointments -Complete labs  as scheduled -Notify provider or care management team with health related questions and concern as needed    Follow Up Plan:  Will follow up within three months.   Patient Care Plan: Hypertension and Hyperlipidemia    Problem Identified: Hypertension (Hypertension)     Long-Range Goal: Hypertension and Hyperlipidemia monitored   Start Date: 02/15/2021  Expected End Date: 06/15/2021  Priority: High  Note:   Objective:  . Last practice recorded BP readings:  BP Readings from Last 3 Encounters:  03/14/21 (!) 159/56  09/27/20 (!) 154/69  06/23/20 119/70    . Most recent eGFR/CrCl: No results found for: EGFR  No components found for: CRCL  Lab Results  Component Value Date   CHOL 167 03/14/2021   HDL 34 (L) 03/14/2021   LDLCALC 71 03/14/2021   TRIG 394 (H) 03/14/2021   CHOLHDL 4.9 (H) 03/14/2021     Current Barriers:  . Chronic Disease Management support and educational needs r/t Hypertension and Hyperlipidemia  Case Manager Clinical Goal(s):  Marland Kitchen Over the next 120 days, patient will demonstrate improved adherence to prescribed treatment plan as evidenced by taking all medications as prescribed, monitoring, and recording blood pressure and adhering to a cardiac prudent/heart healthy diet.  Interventions:  . Collaboration with Birdie Sons, MD regarding development and update of comprehensive plan of care as evidenced by provider attestation and co-signature . Inter-disciplinary care team collaboration (see longitudinal plan of care) . Reviewed compliance with current treatment plan. Reports excellent compliance with medications. Attempting to maintain a heart healthy diet. She is monitoring her BP at home as recommended. Reports systolic readings in the 794'I. Reports diastolic readings in the 01'K to 70's. Reviewed parameters  and indications for notifying a provider. Encouraged to continue monitoring and recording readings.  . Discussed complications of uncontrolled blood  pressure. Reviewed s/sx of heart attack, stroke and worsening symptoms that require immediate medical attention.    Patient Goals/Self-Care Activities: -Self-administer medications as prescribed -Attend all scheduled provider appointments -Monitor and record blood pressure -Adhere to recommended cardiac prudent/heart healthy diet -Notify provider or care management team with questions and new concerns as needed   Follow Up Plan:  Will follow up in three months    Patient Care Plan: Fall Risk (Adult)    Problem Identified: Fall Risk     Long-Range Goal: Absence of Fall and Fall-Related Injury   Start Date: 02/15/2021  Expected End Date: 04/16/2021  Priority: High  Note:   Fall Risk  02/15/2021 05/04/2020 03/18/2020 07/26/2018 01/15/2018  Falls in the past year? 0 0 0 Yes No  Number falls in past yr: 0 - 0 2 or more -  Injury with Fall? - - 0 Yes -  Risk for fall due to : Impaired balance/gait;Other (Comment) Impaired balance/gait - - -  Risk for fall due to: Comment Reports increased gout flares over the past month - - - -  Follow up Falls prevention discussed Falls prevention discussed - Falls prevention discussed -    Current Barriers:  . Risk for Falls d/t Impaired gait/balance.  Clinical Goal(s):  Marland Kitchen Over the next 120 days, patient will not experience falls or require emergent treatment d/t fall related injuries.  Interventions:  . Collaboration with Birdie Sons, MD regarding development and update of comprehensive plan of care as evidenced by provider attestation and co-signature . Inter-disciplinary care team collaboration (see longitudinal plan of care) . Reviewed medications and discussed potential side effects such as lightheadedness and dizziness. Encouraged to notify Dr. Caryn Section if unable to tolerate prescribed regimen. . Discussed safety and fall prevention measures. Reports balance has improved and she has been able to utilize the steps. Denies falls. No changes in  activity tolerance. Encouraged to continue using her walker or cane as needed.   Self-Care Deficits/Patient Goals: -Utilize assistive device appropriately with all ambulation -Use caution when standing and changing positions -Wear secure fitting non skid footwear when ambulating -Ensure pathways are clear and well lit -Contact provider or the care management team with questions and new concerns  Plan Will follow up in three months          Teresa Shields verbalized understanding of the information discussed during the telephonic outreach today. Declined need for mailed/printed instructions. A member of the care management team will follow up with Teresa Shields in three months.    Cristy [facility] Care Management [facility] [phone]

## 2021-03-23 ENCOUNTER — Other Ambulatory Visit: Payer: Self-pay | Admitting: Family Medicine

## 2021-05-25 ENCOUNTER — Other Ambulatory Visit: Payer: Self-pay | Admitting: Family Medicine

## 2021-05-25 NOTE — Telephone Encounter (Signed)
Requested Prescriptions  Pending Prescriptions Disp Refills  . allopurinol (ZYLOPRIM) 300 MG tablet [Pharmacy Med Name: ALLOPURINOL 300 MG Tablet] 90 tablet 0    Sig: TAKE 1 TABLET EVERY DAY     Endocrinology:  Gout Agents Failed - 05/25/2021  7:02 PM      Failed - Uric Acid in normal range and within 360 days    Uric Acid  Date Value Ref Range Status  01/12/2017 3.7 2.5 - 7.1 mg/dL Final    Comment:               Therapeutic target for gout patients: <6.0         Failed - Cr in normal range and within 360 days    Creatinine, Ser  Date Value Ref Range Status  03/14/2021 1.15 (H) 0.57 - 1.00 mg/dL Final         Passed - Valid encounter within last 12 months    Recent Outpatient Visits          2 months ago Type 2 diabetes mellitus with diabetic neuropathy, without long-term current use of insulin (HCC)   [facility] [name], MD   8 months ago Type 2 diabetes mellitus with diabetic neuropathy, without long-term current use of insulin (HCC)   [facility] [name], MD   11 months ago Type 2 diabetes mellitus with diabetic neuropathy, without long-term current use of insulin (HCC)   [facility] [name], MD   1 year ago Type 2 diabetes mellitus with diabetic neuropathy, without long-term current use of insulin [facility])   [facility] [name], MD   1 year ago Need for influenza vaccination   [facility] [name], MD      Future Appointments            In 1 month [name], [name], MD [facility], [facility]

## 2021-06-24 ENCOUNTER — Other Ambulatory Visit: Payer: Self-pay

## 2021-06-24 ENCOUNTER — Encounter: Payer: Self-pay | Admitting: Family Medicine

## 2021-06-24 ENCOUNTER — Ambulatory Visit
Admission: RE | Admit: 2021-06-24 | Discharge: 2021-06-24 | Disposition: A | Discharge status: Home / Self Care | Payer: Medicare HMO | Attending: Family Medicine | Admitting: Family Medicine

## 2021-06-24 ENCOUNTER — Ambulatory Visit
Admission: RE | Admit: 2021-06-24 | Discharge: 2021-06-24 | Disposition: A | Discharge status: Home / Self Care | Payer: Medicare HMO | Source: Ambulatory Visit | Attending: Family Medicine | Admitting: Family Medicine

## 2021-06-24 ENCOUNTER — Ambulatory Visit (INDEPENDENT_AMBULATORY_CARE_PROVIDER_SITE_OTHER): Payer: Medicare HMO | Admitting: Family Medicine

## 2021-06-24 ENCOUNTER — Other Ambulatory Visit: Payer: Self-pay | Admitting: Family Medicine

## 2021-06-24 VITALS — BP 109/68 | HR 76 | Temp 97.2°F | Resp 16 | Wt 167.0 lb

## 2021-06-24 DIAGNOSIS — M25552 Pain in left hip: Secondary | ICD-10-CM

## 2021-06-24 DIAGNOSIS — E1140 Type 2 diabetes mellitus with diabetic neuropathy, unspecified: Secondary | ICD-10-CM | POA: Diagnosis not present

## 2021-06-24 DIAGNOSIS — M25562 Pain in left knee: Secondary | ICD-10-CM

## 2021-06-24 LAB — POCT GLYCOSYLATED HEMOGLOBIN (HGB A1C)
Est. average glucose Bld gHb Est-mCnc: 166
Hemoglobin A1C: 7.4 % — AB (ref 4.0–5.6)

## 2021-06-24 NOTE — Progress Notes (Signed)
Established patient visit   Patient: Teresa Shields   DOB: 12/05/1936   85 y.o. Female  MRN: [id number] Visit Date: 06/24/2021  Today's healthcare provider: Mila Merry, MD   Chief Complaint  Patient presents with   Diabetes   Hyperlipidemia   Hypertension   Subjective    HPI  Diabetes Mellitus Type II, Follow-up  Lab Results  Component Value Date   HGBA1C 7.4 (A) 06/24/2021   HGBA1C 7.7 (A) 03/14/2021   HGBA1C 7.4 (A) 09/27/2020   Wt Readings from Last 3 Encounters:  06/24/21 167 lb (75.8 kg)  03/14/21 171 lb (77.6 kg)  09/27/20 166 lb 6.4 oz (75.5 kg)   Last seen for diabetes 3 months ago.  Management since then includes starting Metformin 500mg  daily. For neuropathy, increased pregabalin (LYRICA) to 75 MG capsule; Take 1 capsule (75 mg total) by mouth 3 (three) times daily. She reports good compliance with treatment. She is not having side effects.  Symptoms: No fatigue No foot ulcerations  No appetite changes No nausea  Yes paresthesia of the feet (pain in feet) No polydipsia  No polyuria No visual disturbances   No vomiting     Home blood sugar records:  blood sugars are not checked  Episodes of hypoglycemia? No    Current insulin regiment: none Most Recent Eye Exam: not  UTD Current exercise: walking Current diet habits: well balanced  Pertinent Labs: Lab Results  Component Value Date   CHOL 167 03/14/2021   HDL 34 (L) 03/14/2021   LDLCALC 71 03/14/2021   TRIG 394 (H) 03/14/2021   CHOLHDL 4.9 (H) 03/14/2021   Lab Results  Component Value Date   NA 142 03/14/2021   K 3.9 03/14/2021   CREATININE 1.15 (H) 03/14/2021   GFRNONAA 32 (L) 09/27/2020   GFRAA 37 (L) 09/27/2020   GLUCOSE 199 (H) 03/14/2021     ---------------------------------------------------------------------------------------------------   Lipid/Cholesterol, Follow-up  Last lipid panel Other pertinent labs  Lab Results  Component Value Date   CHOL 167 03/14/2021    HDL 34 (L) 03/14/2021   LDLCALC 71 03/14/2021   TRIG 394 (H) 03/14/2021   CHOLHDL 4.9 (H) 03/14/2021   Lab Results  Component Value Date   ALT 28 03/14/2021   AST 22 03/14/2021   PLT 283 09/17/2019   TSH 1.690 01/12/2017     She was last seen for this 3 months ago.  Management since that visit includes advising patient to stay on fish oil since triglycerides were high.  She reports good compliance with treatment. She is not having side effects.   Symptoms: No chest pain No chest pressure/discomfort  No dyspnea No lower extremity edema  No numbness or tingling of extremity No orthopnea  No palpitations No paroxysmal nocturnal dyspnea  No speech difficulty No syncope   Current diet: well balanced Current exercise: walking  The ASCVD Risk score 03/12/2017 DC Jr., et al., 2013) failed to calculate for the following reasons:   The 2013 ASCVD risk score is only valid for ages 36 to 29  ---------------------------------------------------------------------------------------------------   Hypertension, follow-up  BP Readings from Last 3 Encounters:  06/24/21 109/68  03/14/21 (!) 159/56  09/27/20 (!) 154/69   Wt Readings from Last 3 Encounters:  06/24/21 167 lb (75.8 kg)  03/14/21 171 lb (77.6 kg)  09/27/20 166 lb 6.4 oz (75.5 kg)     She was last seen for hypertension 3 months ago.  BP at that visit was 159/56. Management since  that visit includes continuing the same medications.  She reports good compliance with treatment. She is not having side effects.  She is following a Regular diet. She is exercising. She does not smoke.  Use of agents associated with hypertension: NSAIDS.   Outside blood pressures are 112/72 this morning at home. Symptoms: No chest pain No chest pressure  No palpitations No syncope  No dyspnea No orthopnea  No paroxysmal nocturnal dyspnea No lower extremity edema   Pertinent labs: Lab Results  Component Value Date   CHOL 167 03/14/2021    HDL 34 (L) 03/14/2021   LDLCALC 71 03/14/2021   TRIG 394 (H) 03/14/2021   CHOLHDL 4.9 (H) 03/14/2021   Lab Results  Component Value Date   NA 142 03/14/2021   K 3.9 03/14/2021   CREATININE 1.15 (H) 03/14/2021   GFRNONAA 32 (L) 09/27/2020   GFRAA 37 (L) 09/27/2020   GLUCOSE 199 (H) 03/14/2021     The ASCVD Risk score Denman George DC Jr., et al., 2013) failed to calculate for the following reasons:   The 2013 ASCVD risk score is only valid for ages 79 to 88   ---------------------------------------------------------------------------------------------------      Medications: Outpatient Medications Prior to Visit  Medication Sig   acetaminophen (TYLENOL) 500 MG tablet Take 1,000 mg by mouth 2 (two) times daily as needed (for pain.).   allopurinol (ZYLOPRIM) 300 MG tablet TAKE 1 TABLET EVERY DAY   Ascorbic Acid (VITAMIN C) 1000 MG tablet Take 1,000 mg by mouth daily.   aspirin EC 81 MG tablet Take 81 mg by mouth daily.   atorvastatin (LIPITOR) 40 MG tablet TAKE 1 TABLET EVERY EVENING   calcium-vitamin D (OSCAL WITH D) 500-200 MG-UNIT tablet Take 1 tablet by mouth daily with breakfast.    colchicine 0.6 MG tablet 2 tablets at first sign of gout, then one daily as needed   HYDROcodone-acetaminophen (NORCO/VICODIN) 5-325 MG tablet Take 1-2 tablets by mouth every 4 (four) hours as needed for moderate pain.   indomethacin (INDOCIN) 25 MG capsule Take 1-2 capsules (25-50 mg total) by mouth 2 (two) times daily with a meal. For gout (Patient taking differently: Take 25-50 mg by mouth 2 (two) times daily as needed (for gout flare ups).)   metFORMIN (GLUCOPHAGE-XR) 500 MG 24 hr tablet Take 1 tablet (500 mg total) by mouth daily.   metoprolol succinate (TOPROL-XL) 50 MG 24 hr tablet TAKE 1 TABLET EVERY DAY FOR HIGH BLOOD PRESSURE   Multiple Vitamin (MULTIVITAMIN WITH MINERALS) TABS tablet Take 1 tablet by mouth daily.   Omega-3 Fatty Acids (FISH OIL) 1000 MG CAPS Take 1,000 mg by mouth daily.    pregabalin (LYRICA) 75 MG capsule Take 1 capsule (75 mg total) by mouth 3 (three) times daily.   triamterene-hydrochlorothiazide (MAXZIDE) 75-50 MG tablet TAKE 1 TABLET EVERY DAY   Vitamins/Minerals TABS Take 1 tablet by mouth daily.    No facility-administered medications prior to visit.    Review of Systems  Constitutional:  Negative for appetite change, chills, fatigue and fever.  Respiratory:  Negative for chest tightness and shortness of breath.   Cardiovascular:  Negative for chest pain and palpitations.  Gastrointestinal:  Negative for abdominal pain, nausea and vomiting.  Musculoskeletal:  Positive for arthralgias (in left leg).  Neurological:  Positive for weakness (in left leg). Negative for dizziness.      Objective    BP 109/68 (BP Location: Left Arm, Patient Position: Sitting, Cuff Size: Large)   Pulse 76  Temp (!) 97.2 F (36.2 C) (Temporal)   Resp 16   Wt 167 lb (75.8 kg)   BMI 26.95 kg/m     Physical Exam   General: Appearance:     Well developed, well nourished female in no acute distress  Eyes:    PERRL, conjunctiva/corneas clear, EOM's intact       Lungs:     Clear to auscultation bilaterally, respirations unlabored  Heart:    Normal heart rate. Normal rhythm. No murmurs, rubs, or gallops.    MS:   All extremities are intact.    Neurologic:   Awake, alert, oriented x 3. No apparent focal neurological defect.         Results for orders placed or performed in visit on 06/24/21  POCT HgB A1C  Result Value Ref Range   Hemoglobin A1C 7.4 (A) 4.0 - 5.6 %   Est. average glucose Bld gHb Est-mCnc 166     Assessment & Plan     1. Type 2 diabetes mellitus with diabetic neuropathy, without long-term current use of insulin (HCC) Doing well with initiation of metformin. Reinforced health diet choices. Follow up 4 months.   2. Left hip pain  - DG Hip Unilat W OR W/O Pelvis 1V Left; Future  3. Acute pain of left knee  - DG Knee Complete 4 Views Left;  Future   Future Appointments  Date Time Provider Department Center  06/27/2021 10:00 AM [facility] CASE MANAGER [facility]  11/01/2021 10:20 AM [name], [name], MD [facility]        The entirety of the information documented in the History of Present Illness, Review of Systems and Physical Exam were personally obtained by me. Portions of this information were initially documented by the CMA and reviewed by me for thoroughness and accuracy.     Mila Merry, MD  [facility] [phone] (phone) [phone] (fax)  [facility]

## 2021-06-25 ENCOUNTER — Other Ambulatory Visit: Payer: Self-pay | Admitting: Family Medicine

## 2021-06-25 NOTE — Telephone Encounter (Signed)
Requested Prescriptions  Pending Prescriptions Disp Refills  . atorvastatin (LIPITOR) 40 MG tablet [Pharmacy Med Name: ATORVASTATIN CALCIUM 40 MG Tablet] 90 tablet 2    Sig: TAKE 1 TABLET EVERY EVENING     Cardiovascular:  Antilipid - Statins Failed - 06/25/2021  1:39 PM      Failed - HDL in normal range and within 360 days    HDL  Date Value Ref Range Status  03/14/2021 34 (L) >39 mg/dL Final         Failed - Triglycerides in normal range and within 360 days    Triglycerides  Date Value Ref Range Status  03/14/2021 394 (H) 0 - 149 mg/dL Final         Passed - Total Cholesterol in normal range and within 360 days    Cholesterol, Total  Date Value Ref Range Status  03/14/2021 167 100 - 199 mg/dL Final         Passed - LDL in normal range and within 360 days    LDL Chol Calc (NIH)  Date Value Ref Range Status  03/14/2021 71 0 - 99 mg/dL Final         Passed - Patient is not pregnant      Passed - Valid encounter within last 12 months    Recent Outpatient Visits          Yesterday Type 2 diabetes mellitus with diabetic neuropathy, without long-term current use of insulin (HCC)   [facility] [name], MD   3 months ago Type 2 diabetes mellitus with diabetic neuropathy, without long-term current use of insulin (HCC)   [facility] [name], MD   9 months ago Type 2 diabetes mellitus with diabetic neuropathy, without long-term current use of insulin (HCC)   [facility] [name], MD   1 year ago Type 2 diabetes mellitus with diabetic neuropathy, without long-term current use of insulin (HCC)   [facility] [name], MD   1 year ago Type 2 diabetes mellitus with diabetic neuropathy, without long-term current use of insulin [facility])   [facility] [name], [name], MD      Future Appointments            In 4 months [name], [name], MD [facility], [facility]

## 2021-06-27 ENCOUNTER — Other Ambulatory Visit: Payer: Self-pay

## 2021-06-27 ENCOUNTER — Ambulatory Visit (INDEPENDENT_AMBULATORY_CARE_PROVIDER_SITE_OTHER): Payer: Medicare HMO

## 2021-06-27 DIAGNOSIS — I10 Essential (primary) hypertension: Secondary | ICD-10-CM

## 2021-06-27 DIAGNOSIS — E1140 Type 2 diabetes mellitus with diabetic neuropathy, unspecified: Secondary | ICD-10-CM

## 2021-06-27 DIAGNOSIS — E782 Mixed hyperlipidemia: Secondary | ICD-10-CM | POA: Diagnosis not present

## 2021-06-27 DIAGNOSIS — M25552 Pain in left hip: Secondary | ICD-10-CM

## 2021-06-27 NOTE — Chronic Care Management (AMB) (Signed)
Chronic Care Management   CCM RN Visit Note  06/27/2021 Name: Teresa Shields MRN: [id number] DOB: 23-Jul-1936  Subjective: Teresa Shields is a 85 y.o. year old female who is a primary care patient of [name], Kirstie Peri, MD. The care management team was consulted for assistance with disease management and care coordination needs.    Engaged with patient by telephone for follow up visit in response to provider referral for case management and/or care coordination services.   Consent to Services:  The patient was given information about Chronic Care Management services, agreed to services, and gave verbal consent prior to initiation of services.  Please see initial visit note for detailed documentation.   Assessment: Review of patient past medical history, allergies, medications, health status, including review of consultants reports, laboratory and other test data, was performed as part of comprehensive evaluation and provision of chronic care management services.   SDOH (Social Determinants of Health) assessments and interventions performed:    CCM Care Plan  Allergies  Allergen Reactions   Naproxen Diarrhea    Outpatient Encounter Medications as of 06/27/2021  Medication Sig   acetaminophen (TYLENOL) 500 MG tablet Take 1,000 mg by mouth 2 (two) times daily as needed (for pain.).   allopurinol (ZYLOPRIM) 300 MG tablet TAKE 1 TABLET EVERY DAY   Ascorbic Acid (VITAMIN C) 1000 MG tablet Take 1,000 mg by mouth daily.   aspirin EC 81 MG tablet Take 81 mg by mouth daily.   atorvastatin (LIPITOR) 40 MG tablet TAKE 1 TABLET EVERY EVENING   calcium-vitamin D (OSCAL WITH D) 500-200 MG-UNIT tablet Take 1 tablet by mouth daily with breakfast.    colchicine 0.6 MG tablet 2 tablets at first sign of gout, then one daily as needed   HYDROcodone-acetaminophen (NORCO/VICODIN) 5-325 MG tablet Take 1-2 tablets by mouth every 4 (four) hours as needed for moderate pain.   indomethacin (INDOCIN) 25 MG  capsule Take 1-2 capsules (25-50 mg total) by mouth 2 (two) times daily with a meal. For gout (Patient taking differently: Take 25-50 mg by mouth 2 (two) times daily as needed (for gout flare ups).)   metFORMIN (GLUCOPHAGE-XR) 500 MG 24 hr tablet Take 1 tablet (500 mg total) by mouth daily.   metoprolol succinate (TOPROL-XL) 50 MG 24 hr tablet TAKE 1 TABLET EVERY DAY FOR HIGH BLOOD PRESSURE   Multiple Vitamin (MULTIVITAMIN WITH MINERALS) TABS tablet Take 1 tablet by mouth daily.   Omega-3 Fatty Acids (FISH OIL) 1000 MG CAPS Take 1,000 mg by mouth daily.   pregabalin (LYRICA) 75 MG capsule Take 1 capsule (75 mg total) by mouth 3 (three) times daily.   triamterene-hydrochlorothiazide (MAXZIDE) 75-50 MG tablet TAKE 1 TABLET EVERY DAY   Vitamins/Minerals TABS Take 1 tablet by mouth daily.    No facility-administered encounter medications on file as of 06/27/2021.    Patient Active Problem List   Diagnosis Date Noted   CKD (chronic kidney disease) stage 3, GFR 30-59 ml/min (HCC) 03/22/2020   Ankle fracture 05/08/2018   Bimalleolar ankle fracture 04/11/2018      03/08/2018   Diabetes mellitus with nephropathy ([place]) 01/17/2018   Arthralgia 07/04/2016   Edema 07/04/2016   Osteopenia 06/27/2015   Type 2 diabetes mellitus with diabetic neuropathy, without long-term current use of insulin ([place]) 06/18/2015   Hypokalemia 06/18/2015   Lumbago 06/18/2015   Neuralgia 06/18/2015   Allergic rhinitis 03/18/2010   Hypercalcemia 09/22/2009   Gout 01/15/2008   Hyperlipidemia, mixed 01/15/2008   Hypertension, essential,  benign 01/15/2008   Malignant neoplasm of skin of leg 01/15/2008    Conditions to be addressed/monitored:HTN, DMII, and Fall Risk Patient Care Plan: Diabetes Type 2 (Adult)     Problem Identified: Disease Progression (Diabetes, Type 2)      Long-Range Goal: Disease Progression Prevented or Minimized   Start Date: 06/27/2021  Expected End Date: 09/25/2021  Priority: Medium  Note:     Current Barriers:  Chronic Disease Management support and educational needs r/t Diabetes self-management.  Case Manager Clinical Goal(s):  Over the next 90 days, patient will demonstrate improved adherence to prescribed treatment plan for Diabetes self management as evidenced by adherence to ADA/ carb modified diet.  Interventions:  Collaboration with Birdie Sons, MD regarding development and update of comprehensive plan of care as evidenced by provider attestation and co-signature Inter-disciplinary care team collaboration (see longitudinal plan of care) Reviewed medications and current plan for Diabetes management. Reports excellent compliance with medications. Denies concerns regarding medication management or prescription cost. Continues to receive medications via [facility] mail order. Reviewed s/sx of hypoglycemia and hyperglycemia along with appropriate interventions. Report not monitoring blood glucose levels. Denies experiencing concerning symptoms. Reports  doing well with monitoring carb intake. Attempting to maintain a diabetic diet.     Patient Goals/Self-Care Activities -Continue compliance with ADA/carb modified diet.  -Attend all scheduled provider appointments -Complete labs as scheduled -Notify provider or care management team with health related questions and concern as needed    Follow Up Plan:  Will follow up within three months.    Patient Care Plan: Hypertension and Hyperlipidemia     Problem Identified: Hypertension (Hypertension) Resolved 06/27/2021     Long-Range Goal: Hypertension and Hyperlipidemia monitored Completed 06/27/2021  Start Date: 02/15/2021  Expected End Date: 06/15/2021  Priority: High  Note:   Current Barriers:  Chronic Disease Management support and educational needs r/t Hypertension and Hyperlipidemia  Case Manager Clinical Goal(s):  Over the next 120 days, patient will demonstrate improved adherence to prescribed treatment plan as  evidenced by taking all medications as prescribed, monitoring, and recording blood pressure and adhering to a cardiac prudent/heart healthy diet.  Interventions:  Collaboration with Birdie Sons, MD regarding development and update of comprehensive plan of care as evidenced by provider attestation and co-signature Inter-disciplinary care team collaboration (see longitudinal plan of care) Reviewed compliance with current treatment plan. Reports excellent compliance with medications.  Reviewed established BP parameters. Reports home readings have been within range. Reports systolic readings in the 832'N and diastolic readings in the 19'T and 70's. Reports doing very well with nutritional intake. Encouraged to continue monitoring and recording readings.  Reviewed s/sx of heart attack, stroke and worsening symptoms that require immediate medical attention.    Patient Goals/Self-Care Activities: -Self-administer medications as prescribed -Attend all scheduled provider appointments -Monitor and record blood pressure -Adhere to recommended cardiac prudent/heart healthy diet -Notify provider or care management team with questions and new concerns as needed  Goal Met     Patient Care Plan: Fall Risk (Adult)  Completed 06/27/2021   Problem Identified: Fall Risk Resolved 06/27/2021     Long-Range Goal: Absence of Fall and Fall-Related Injury Completed 06/27/2021  Start Date: 02/15/2021  Expected End Date: 04/16/2021  This Visit's Progress: On track  Priority: High  Note:   Current Barriers:  Moderate Risk for Falls d/t Impaired gait/balance.  Clinical Goal(s):  Over the next 120 days, patient will not experience falls or require emergent treatment d/t fall related injuries.  Interventions:  Collaboration with Birdie Sons, MD regarding development and update of comprehensive plan of care as evidenced by provider attestation and co-signature Inter-disciplinary care team collaboration  (see longitudinal plan of care) Reviewed safety and fall prevention measures. Reports using her walker when ambulating as advised. Completed x-rays as ordered d/t pain in her left hip and knee. Denies recent gout flares. Reports being very careful when ambulating. Denies recent falls. Reports being able to perform ADL's and small tasks independently. Reports her daughter remains readily available to assist when needed. Denies need for additional assistive or safety devices. Denies need for additional assistance in the home.    Self-Care Deficits/Patient Goals: -Utilize assistive device appropriately with all ambulation -Use caution when standing and changing positions -Wear secure fitting non skid footwear when ambulating -Ensure pathways are clear and well lit -Contact provider or the care management team with questions and new concerns  Goal Met       PLAN: A member of the care management team will follow up with Teresa Shields in three months.   Cristy [facility] Care Management [facility] [phone]

## 2021-06-27 NOTE — Progress Notes (Signed)
Order for referral placed. Patient advised.  

## 2021-06-27 NOTE — Patient Instructions (Addendum)
Thank you for allowing the Chronic Care Management team to participate in your care.   Goals Addressed: Patient Care Plan: Diabetes Type 2 (Adult)     Problem Identified: Disease Progression (Diabetes, Type 2)      Long-Range Goal: Disease Progression Prevented or Minimized   Start Date: 06/27/2021  Expected End Date: 09/25/2021  Priority: Medium  Note:    Current Barriers:  Chronic Disease Management support and educational needs r/t Diabetes self-management.  Case Manager Clinical Goal(s):  Over the next 90 days, patient will demonstrate improved adherence to prescribed treatment plan for Diabetes self management as evidenced by adherence to ADA/ carb modified diet.  Interventions:  Collaboration with Birdie Sons, MD regarding development and update of comprehensive plan of care as evidenced by provider attestation and co-signature Inter-disciplinary care team collaboration (see longitudinal plan of care) Reviewed medications and current plan for Diabetes management. Reports excellent compliance with medications. Denies concerns regarding medication management or prescription cost. Continues to receive medications via [facility] mail order. Reviewed s/sx of hypoglycemia and hyperglycemia along with appropriate interventions. Report not monitoring blood glucose levels. Denies experiencing concerning symptoms. Reports  doing well with monitoring carb intake. Attempting to maintain a diabetic diet.     Patient Goals/Self-Care Activities -Continue compliance with ADA/carb modified diet.  -Attend all scheduled provider appointments -Complete labs as scheduled -Notify provider or care management team with health related questions and concern as needed    Follow Up Plan:  Will follow up within three months.    Patient Care Plan: Hypertension and Hyperlipidemia     Problem Identified: Hypertension (Hypertension) Resolved 06/27/2021     Long-Range Goal: Hypertension and  Hyperlipidemia monitored Completed 06/27/2021  Start Date: 02/15/2021  Expected End Date: 06/15/2021  Priority: High  Note:   Current Barriers:  Chronic Disease Management support and educational needs r/t Hypertension and Hyperlipidemia  Case Manager Clinical Goal(s):  Over the next 120 days, patient will demonstrate improved adherence to prescribed treatment plan as evidenced by taking all medications as prescribed, monitoring, and recording blood pressure and adhering to a cardiac prudent/heart healthy diet.  Interventions:  Collaboration with Birdie Sons, MD regarding development and update of comprehensive plan of care as evidenced by provider attestation and co-signature Inter-disciplinary care team collaboration (see longitudinal plan of care) Reviewed compliance with current treatment plan. Reports excellent compliance with medications.  Reviewed established BP parameters. Reports home readings have been within range. Reports systolic readings in the 144'Y and diastolic readings in the 18'H and 70's. Reports doing very well with nutritional intake. Encouraged to continue monitoring and recording readings.  Reviewed s/sx of heart attack, stroke and worsening symptoms that require immediate medical attention.    Patient Goals/Self-Care Activities: -Self-administer medications as prescribed -Attend all scheduled provider appointments -Monitor and record blood pressure -Adhere to recommended cardiac prudent/heart healthy diet -Notify provider or care management team with questions and new concerns as needed  Goal Met     Patient Care Plan: Fall Risk (Adult)  Completed 06/27/2021   Problem Identified: Fall Risk Resolved 06/27/2021     Long-Range Goal: Absence of Fall and Fall-Related Injury Completed 06/27/2021  Start Date: 02/15/2021  Expected End Date: 04/16/2021  This Visit's Progress: On track  Priority: High  Note:   Current Barriers:  Moderate Risk for Falls d/t Impaired  gait/balance.  Clinical Goal(s):  Over the next 120 days, patient will not experience falls or require emergent treatment d/t fall related injuries.  Interventions:  Collaboration with  Birdie Sons, MD regarding development and update of comprehensive plan of care as evidenced by provider attestation and co-signature Inter-disciplinary care team collaboration (see longitudinal plan of care) Reviewed safety and fall prevention measures. Reports using her walker when ambulating as advised. Completed x-rays as ordered d/t pain in her left hip and knee. Denies recent gout flares. Reports being very careful when ambulating. Denies recent falls. Reports being able to perform ADL's and small tasks independently. Reports her daughter remains readily available to assist when needed. Denies need for additional assistive or safety devices. Denies need for additional assistance in the home.    Self-Care Deficits/Patient Goals: -Utilize assistive device appropriately with all ambulation -Use caution when standing and changing positions -Wear secure fitting non skid footwear when ambulating -Ensure pathways are clear and well lit -Contact provider or the care management team with questions and new concerns  Goal Met        Teresa Shields verbalized understanding of the information discussed during the telephonic outreach today. Declined need for mailed/printed instructions. A member of the care management team will follow up in three months.   [facility]risty [facility] Care Management [facility] [phone]

## 2021-07-06 DIAGNOSIS — M1612 Unilateral primary osteoarthritis, left hip: Secondary | ICD-10-CM | POA: Diagnosis not present

## 2021-08-05 ENCOUNTER — Other Ambulatory Visit: Payer: Self-pay | Admitting: Family Medicine

## 2021-08-31 ENCOUNTER — Other Ambulatory Visit: Payer: Self-pay

## 2021-08-31 DIAGNOSIS — E1140 Type 2 diabetes mellitus with diabetic neuropathy, unspecified: Secondary | ICD-10-CM

## 2021-08-31 NOTE — Telephone Encounter (Signed)
[facility] faxed refill request for the following medications:  pregabalin (LYRICA) 75 MG capsule   Please advise.

## 2021-08-31 NOTE — Telephone Encounter (Signed)
Last refill: 03/14/2021 # 270, with 1 refill  Last office visit: 06/24/2021  Next office visit: 11/01/2021

## 2021-09-01 MED ORDER — PREGABALIN 75 MG PO CAPS: 75.0000 mg | ORAL_CAPSULE | Freq: Three times a day (TID) | ORAL | 1 refills | Status: DC

## 2021-09-26 ENCOUNTER — Ambulatory Visit (INDEPENDENT_AMBULATORY_CARE_PROVIDER_SITE_OTHER): Payer: Medicare HMO

## 2021-09-26 DIAGNOSIS — E1140 Type 2 diabetes mellitus with diabetic neuropathy, unspecified: Secondary | ICD-10-CM

## 2021-09-26 NOTE — Patient Instructions (Addendum)
Thank you for allowing the Chronic Care Management team to participate in your care.    Patient Care Plan: Diabetes Type 2 (Adult)     Problem Identified: Disease Progression (Diabetes, Type 2)      Long-Range Goal: Disease Progression Prevented or Minimized   Start Date: 06/27/2021  Expected End Date: 09/25/2021  Priority: Medium  Note:   Lab Results  Component Value Date   HGBA1C 7.4 (A) 06/24/2021    Current Barriers:  Chronic Disease Management support and educational needs r/t Diabetes self-management.  Case Manager Clinical Goal(s):  Over the next 90 days, patient will demonstrate improved adherence to prescribed treatment plan for Diabetes self management as evidenced by taking medications as prescribed and adherence to ADA/ carb modified diet.  Interventions:  Collaboration with [name], MD regarding development and update of comprehensive plan of care as evidenced by provider attestation and co-signature Inter-disciplinary care team collaboration (see longitudinal plan of care) Reviewed medications and current plan for Diabetes management. Remains compliant with medications. Currently not monitoring blood glucose levels. Denies s/sx of hypoglycemia or hyperglycemia. Unable to exercise regularly d/t impaired balance but reports doing very well with nutritional intake. Declined need for additional diabetic resources. Discussed pending provider appointment and A1C check. Scheduled to complete a routine follow up next month. Will re evaluate needs after updated A1C is obtained.    Patient Goals/Self-Care Activities Self administer medications as prescribed Continue compliance with ADA/carb modified diet.  Attend provider follow up as scheduled in November Notify provider or care management team with health related questions and concern as needed   Follow Up Plan:  Will follow up within two months.      Teresa Shields verbalized understanding of the information  discussed during the telephonic outreach. Declined need for mailed/printed instructions. A member of the care management team will follow up in two months.   Teresa Shields [facility] Care Management [facility] [phone]

## 2021-09-26 NOTE — Chronic Care Management (AMB) (Signed)
Chronic Care Management   CCM RN Visit Note  09/26/2021 Name: Teresa Shields MRN: [id number] DOB: Feb 07, 1936  Subjective: Teresa Shields is a 85 y.o. year old female who is a primary care patient of [name], [name], MD. The care management team was consulted for assistance with disease management and care coordination needs.    Engaged with patient by telephone for follow up visit in response to provider referral for case management and care coordination services.   Consent to Services:  The patient was given information about Chronic Care Management services, agreed to services, and gave verbal consent prior to initiation of services.  Please see initial visit note for detailed documentation.   Assessment: Review of patient past medical history, allergies, medications, health status, including review of consultants reports, laboratory and other test data, was performed as part of comprehensive evaluation and provision of chronic care management services.   SDOH (Social Determinants of Health) assessments and interventions performed: No    CCM Care Plan  Allergies  Allergen Reactions   Naproxen Diarrhea    Outpatient Encounter Medications as of 09/26/2021  Medication Sig   acetaminophen (TYLENOL) 500 MG tablet Take 1,000 mg by mouth 2 (two) times daily as needed (for pain.).   allopurinol (ZYLOPRIM) 300 MG tablet TAKE 1 TABLET EVERY DAY   Ascorbic Acid (VITAMIN C) 1000 MG tablet Take 1,000 mg by mouth daily.   aspirin EC 81 MG tablet Take 81 mg by mouth daily.   atorvastatin (LIPITOR) 40 MG tablet TAKE 1 TABLET EVERY EVENING   calcium-vitamin D (OSCAL WITH D) 500-200 MG-UNIT tablet Take 1 tablet by mouth daily with breakfast.    colchicine 0.6 MG tablet 2 tablets at first sign of gout, then one daily as needed   HYDROcodone-acetaminophen (NORCO/VICODIN) 5-325 MG tablet Take 1-2 tablets by mouth every 4 (four) hours as needed for moderate pain.   indomethacin (INDOCIN) 25 MG  capsule Take 1-2 capsules (25-50 mg total) by mouth 2 (two) times daily with a meal. For gout (Patient not taking: Reported on 09/26/2021)   metFORMIN (GLUCOPHAGE-XR) 500 MG 24 hr tablet Take 1 tablet (500 mg total) by mouth daily.   metoprolol succinate (TOPROL-XL) 50 MG 24 hr tablet TAKE 1 TABLET EVERY DAY FOR HIGH BLOOD PRESSURE   Multiple Vitamin (MULTIVITAMIN WITH MINERALS) TABS tablet Take 1 tablet by mouth daily.   Omega-3 Fatty Acids (FISH OIL) 1000 MG CAPS Take 1,000 mg by mouth daily.   pregabalin (LYRICA) 75 MG capsule Take 1 capsule (75 mg total) by mouth 3 (three) times daily.   triamterene-hydrochlorothiazide (MAXZIDE) 75-50 MG tablet TAKE 1 TABLET EVERY DAY   Vitamins/Minerals TABS Take 1 tablet by mouth daily.    No facility-administered encounter medications on file as of 09/26/2021.    Patient Active Problem List   Diagnosis Date Noted   CKD (chronic kidney disease) stage 3, GFR 30-59 ml/min (HCC) 03/22/2020   Ankle fracture 05/08/2018   Bimalleolar ankle fracture 04/11/2018      03/08/2018   Diabetes mellitus with nephropathy (HCC) 01/17/2018   Arthralgia 07/04/2016   Edema 07/04/2016   Osteopenia 06/27/2015   Type 2 diabetes mellitus with diabetic neuropathy, without long-term current use of insulin (HCC) 06/18/2015   Hypokalemia 06/18/2015   Lumbago 06/18/2015   Neuralgia 06/18/2015   Allergic rhinitis 03/18/2010   Hypercalcemia 09/22/2009   Gout 01/15/2008   Hyperlipidemia, mixed 01/15/2008   Hypertension, essential, benign 01/15/2008   Malignant neoplasm of skin of leg 01/15/2008  Conditions to be addressed/monitored: DMII Patient Care Plan: Diabetes Type 2 (Adult)     Problem Identified: Disease Progression (Diabetes, Type 2)      Long-Range Goal: Disease Progression Prevented or Minimized   Start Date: 06/27/2021  Expected End Date: 09/25/2021  Priority: Medium  Note:   Lab Results  Component Value Date   HGBA1C 7.4 (A) 06/24/2021    Current  Barriers:  Chronic Disease Management support and educational needs r/t Diabetes self-management.  Case Manager Clinical Goal(s):  Over the next 90 days, patient will demonstrate improved adherence to prescribed treatment plan for Diabetes self management as evidenced by taking medications as prescribed and adherence to ADA/ carb modified diet.  Interventions:  Collaboration with [name], MD regarding development and update of comprehensive plan of care as evidenced by provider attestation and co-signature Inter-disciplinary care team collaboration (see longitudinal plan of care) Reviewed medications and current plan for Diabetes management. Remains compliant with medications. Currently not monitoring blood glucose levels. Denies s/sx of hypoglycemia or hyperglycemia. Unable to exercise regularly d/t impaired balance but reports doing very well with nutritional intake. Declined need for additional diabetic resources. Discussed pending provider appointment and A1C check. Scheduled to complete a routine follow up next month. Will re evaluate needs after updated A1C is obtained.    Patient Goals/Self-Care Activities Self administer medications as prescribed Continue compliance with ADA/carb modified diet.  Attend provider follow up as scheduled in November Notify provider or care management team with health related questions and concern as needed   Follow Up Plan:  Will follow up within two months.     PLAN A member of the care management team will follow up in two months.   France Ravens [facility] Care Management [facility] [phone]

## 2021-10-10 DIAGNOSIS — E1140 Type 2 diabetes mellitus with diabetic neuropathy, unspecified: Secondary | ICD-10-CM | POA: Diagnosis not present

## 2021-10-19 ENCOUNTER — Other Ambulatory Visit: Payer: Self-pay | Admitting: Family Medicine

## 2021-11-01 ENCOUNTER — Other Ambulatory Visit: Payer: Self-pay

## 2021-11-01 ENCOUNTER — Encounter: Payer: Self-pay | Admitting: Family Medicine

## 2021-11-01 ENCOUNTER — Ambulatory Visit (INDEPENDENT_AMBULATORY_CARE_PROVIDER_SITE_OTHER): Payer: Medicare HMO | Admitting: Family Medicine

## 2021-11-01 VITALS — BP 139/58 | HR 71 | Temp 98.3°F | Ht 66.0 in | Wt 166.3 lb

## 2021-11-01 DIAGNOSIS — Z23 Encounter for immunization: Secondary | ICD-10-CM

## 2021-11-01 DIAGNOSIS — N390 Urinary tract infection, site not specified: Secondary | ICD-10-CM

## 2021-11-01 DIAGNOSIS — R319 Hematuria, unspecified: Secondary | ICD-10-CM | POA: Diagnosis not present

## 2021-11-01 DIAGNOSIS — E1140 Type 2 diabetes mellitus with diabetic neuropathy, unspecified: Secondary | ICD-10-CM

## 2021-11-01 LAB — POCT URINALYSIS DIPSTICK
Bilirubin, UA: NEGATIVE
Glucose, UA: NEGATIVE
Ketones, UA: NEGATIVE
Nitrite, UA: POSITIVE
Protein, UA: POSITIVE — AB
Spec Grav, UA: 1.01 (ref 1.010–1.025)
Urobilinogen, UA: 0.2 E.U./dL
pH, UA: 7 (ref 5.0–8.0)

## 2021-11-01 LAB — POCT GLYCOSYLATED HEMOGLOBIN (HGB A1C)
Est. average glucose Bld gHb Est-mCnc: 160
Hemoglobin A1C: 7.2 % — AB (ref 4.0–5.6)

## 2021-11-01 MED ORDER — CEPHALEXIN 500 MG PO CAPS: 500.0000 mg | ORAL_CAPSULE | Freq: Two times a day (BID) | ORAL | 0 refills | Status: AC

## 2021-11-01 MED ORDER — GLIPIZIDE ER 2.5 MG PO TB24: 2.5000 mg | ORAL_TABLET | Freq: Every day | ORAL | 1 refills | Status: DC

## 2021-11-01 NOTE — Progress Notes (Addendum)
Established patient visit   Patient: Teresa Shields   DOB: 1935-12-25   85 y.o. Female  MRN: [id number] Visit Date: 11/01/2021  Today's healthcare provider: Lelon Huh, MD   Chief Complaint  Patient presents with   Diabetes   Hypertension   Subjective    HPI  Diabetes Mellitus Type II, Follow-up  Lab Results  Component Value Date   HGBA1C 7.2 (A) 11/01/2021   HGBA1C 7.4 (A) 06/24/2021   HGBA1C 7.7 (A) 03/14/2021   Wt Readings from Last 3 Encounters:  11/01/21 166 lb 4.8 oz (75.4 kg)  06/24/21 167 lb (75.8 kg)  03/14/21 171 lb (77.6 kg)   Last seen for diabetes 4 months ago.  Management since then includes advising patient to continue medications and reinforced health diet choices. She reports good compliance with treatment. Stopped metformin about 2 weeks ago due to side effects.  She is having side effects (Metformin causes diarrhea).  Symptoms: No fatigue No foot ulcerations  No appetite changes No nausea  No paresthesia of the feet  No polydipsia  No polyuria No visual disturbances   No vomiting     Home blood sugar records:  blood sugars are not checked at home  Episodes of hypoglycemia? No    Current insulin regiment: none Most Recent Eye Exam: not UTD Current exercise: walking Current diet habits: in general, an "unhealthy" diet  Pertinent Labs: Lab Results  Component Value Date   CHOL 167 03/14/2021   HDL 34 (L) 03/14/2021   LDLCALC 71 03/14/2021   TRIG 394 (H) 03/14/2021   CHOLHDL 4.9 (H) 03/14/2021   Lab Results  Component Value Date   NA 142 03/14/2021   K 3.9 03/14/2021   CREATININE 1.15 (H) 03/14/2021   EGFR 48 (L) 03/14/2021   MICROALBUR 50 03/22/2020   LABMICR 21.4 01/15/2018     ---------------------------------------------------------------------------------------------------   Hypertension, follow-up  BP Readings from Last 3 Encounters:  11/01/21 (!) 139/58  06/24/21 109/68  03/14/21 (!) 159/56   Wt Readings from  Last 3 Encounters:  11/01/21 166 lb 4.8 oz (75.4 kg)  06/24/21 167 lb (75.8 kg)  03/14/21 171 lb (77.6 kg)     She was last seen for hypertension 7 months ago.  BP at that visit was 159/56. Management since that visit includes continuing same medications.  She reports good compliance with treatment. She is not having side effects.  She is following a Regular diet. She is exercising. She does not smoke.  Use of agents associated with hypertension: NSAIDS.   Outside blood pressures are not checked. Symptoms: No chest pain No chest pressure  No palpitations No syncope  No dyspnea No orthopnea  No paroxysmal nocturnal dyspnea No lower extremity edema   Pertinent labs: Lab Results  Component Value Date   NA 142 03/14/2021   K 3.9 03/14/2021   CREATININE 1.15 (H) 03/14/2021   EGFR 48 (L) 03/14/2021   GLUCOSE 199 (H) 03/14/2021   TSH 1.690 01/12/2017     The ASCVD Risk score (Arnett DK, et al., 2019) failed to calculate for the following reasons:   The 2019 ASCVD risk score is only valid for ages 11 to 24   ---------------------------------------------------------------------------------------------------     Medications: Outpatient Medications Prior to Visit  Medication Sig   acetaminophen (TYLENOL) 500 MG tablet Take 1,000 mg by mouth 2 (two) times daily as needed (for pain.).   allopurinol (ZYLOPRIM) 300 MG tablet TAKE 1 TABLET EVERY DAY   Ascorbic  Acid (VITAMIN C) 1000 MG tablet Take 1,000 mg by mouth daily.   aspirin EC 81 MG tablet Take 81 mg by mouth daily.   calcium-vitamin D (OSCAL WITH D) 500-200 MG-UNIT tablet Take 1 tablet by mouth daily with breakfast.    colchicine 0.6 MG tablet 2 tablets at first sign of gout, then one daily as needed   metFORMIN (GLUCOPHAGE-XR) 500 MG 24 hr tablet Take 1 tablet (500 mg total) by mouth daily.   Multiple Vitamin (MULTIVITAMIN WITH MINERALS) TABS tablet Take 1 tablet by mouth daily.   Omega-3 Fatty Acids (FISH OIL) 1000 MG  CAPS Take 1,000 mg by mouth daily.   pregabalin (LYRICA) 75 MG capsule Take 1 capsule (75 mg total) by mouth 3 (three) times daily.   triamterene-hydrochlorothiazide (MAXZIDE) 75-50 MG tablet TAKE 1 TABLET EVERY DAY   Vitamins/Minerals TABS Take 1 tablet by mouth daily.    atorvastatin (LIPITOR) 40 MG tablet TAKE 1 TABLET EVERY EVENING   HYDROcodone-acetaminophen (NORCO/VICODIN) 5-325 MG tablet Take 1-2 tablets by mouth every 4 (four) hours as needed for moderate pain. (Patient not taking: Reported on 11/01/2021)   indomethacin (INDOCIN) 25 MG capsule Take 1-2 capsules (25-50 mg total) by mouth 2 (two) times daily with a meal. For gout (Patient not taking: Reported on 11/01/2021)   metoprolol succinate (TOPROL-XL) 50 MG 24 hr tablet TAKE 1 TABLET EVERY DAY FOR HIGH BLOOD PRESSURE   No facility-administered medications prior to visit.    Review of Systems  Constitutional:  Negative for appetite change, chills, fatigue and fever.  Respiratory:  Negative for chest tightness and shortness of breath.   Cardiovascular:  Negative for chest pain and palpitations.  Gastrointestinal:  Positive for diarrhea. Negative for abdominal pain, nausea and vomiting.  Neurological:  Negative for dizziness and weakness.      Objective    BP (!) 139/58 (BP Location: Right Arm, Patient Position: Sitting, Cuff Size: Normal)   Pulse 71   Temp 98.3 F (36.8 C) (Oral)   Ht 5' 6"  (1.676 m)   Wt 166 lb 4.8 oz (75.4 kg)   SpO2 100%   BMI 26.84 kg/m  {Show previous vital signs (optional):23777}  Physical Exam  General appearance:  Well developed, well nourished female, cooperative and in no acute distress Head: Normocephalic, without obvious abnormality, atraumatic Respiratory: Respirations even and unlabored, normal respiratory rate Extremities: All extremities are intact.  Skin: Skin color, texture, turgor normal. No rashes seen  Psych: Appropriate mood and affect. Neurologic: Mental status: Alert,  oriented to person, place, and time, thought content appropriate.   Results for orders placed or performed in visit on 11/01/21  POCT HgB A1C  Result Value Ref Range   Hemoglobin A1C 7.2 (A) 4.0 - 5.6 %   Est. average glucose Bld gHb Est-mCnc 160     Assessment & Plan     1. Type 2 diabetes mellitus with diabetic neuropathy, without long-term current use of insulin (HCC) Intolerant to metformin due to GI side effects. Change to   - glipiZIDE (GLUCOTROL XL) 2.5 MG 24 hr tablet; Take 1 tablet (2.5 mg total) by mouth daily with breakfast.  Dispense: 90 tablet; Refill: 1 - Ambulatory referral to Ophthalmology for diabetic eye exam.   2. Need for influenza vaccination  - Flu Vaccine QUAD High Dose IM (Fluad)  3. Urinary tract infection with hematuria, site unspecified  - cephALEXin (KEFLEX) 500 MG capsule; Take 1 capsule (500 mg total) by mouth 2 (two) times daily for 5  days.  Dispense: 10 capsule; Refill: 0   Follow up 3 months.         The entirety of the information documented in the History of Present Illness, Review of Systems and Physical Exam were personally obtained by me. Portions of this information were initially documented by the CMA and reviewed by me for thoroughness and accuracy.     Lelon Huh, MD  [facility] [phone] (phone) [phone] (fax)  [place]

## 2021-11-01 NOTE — Addendum Note (Signed)
Addended by: Benjiman Core on: 11/01/2021 11:27 AM   Modules accepted: Orders

## 2021-11-01 NOTE — Patient Instructions (Signed)
.   Please review the attached list of medications and notify my office if there are any errors.   . Please contact your eyecare professional to schedule a routine eye exam  

## 2021-11-09 ENCOUNTER — Other Ambulatory Visit: Payer: Self-pay | Admitting: Family Medicine

## 2021-11-22 ENCOUNTER — Ambulatory Visit (INDEPENDENT_AMBULATORY_CARE_PROVIDER_SITE_OTHER): Payer: Medicare HMO

## 2021-11-22 DIAGNOSIS — Z0000 Encounter for general adult medical examination without abnormal findings: Secondary | ICD-10-CM | POA: Diagnosis not present

## 2021-11-22 NOTE — Patient Instructions (Signed)
Teresa Shields , Thank you for taking time to come for your Medicare Wellness Visit. I appreciate your ongoing commitment to your health goals. Please review the following plan we discussed and let me know if I can assist you in the future.   Screening recommendations/referrals: Colonoscopy: aged out  Mammogram: declined  Bone Density: 10/07/19, declined referral Recommended yearly ophthalmology/optometry visit for glaucoma screening and checkup Recommended yearly dental visit for hygiene and checkup  Vaccinations: Influenza vaccine: 11/01/21 Pneumococcal vaccine: 01/05/17 Tdap vaccine: 08/28/12 Shingles vaccine: n/d   Covid-19:has had 3 shots  Advanced directives: no, declined info  Conditions/risks identified: none  Next appointment: Follow up in one year for your annual wellness visit    Preventive Care 65 Years and Older, Female Preventive care refers to lifestyle choices and visits with your health care provider that can promote health and wellness. What does preventive care include? A yearly physical exam. This is also called an annual well check. Dental exams once or twice a year. Routine eye exams. Ask your health care provider how often you should have your eyes checked. Personal lifestyle choices, including: Daily care of your teeth and gums. Regular physical activity. Eating a healthy diet. Avoiding tobacco and drug use. Limiting alcohol use. Practicing safe sex. Taking low-dose aspirin every day. Taking vitamin and mineral supplements as recommended by your health care provider. What happens during an annual well check? The services and screenings done by your health care provider during your annual well check will depend on your age, overall health, lifestyle risk factors, and family history of disease. Counseling  Your health care provider may ask you questions about your: Alcohol use. Tobacco use. Drug use. Emotional well-being. Home and relationship  well-being. Sexual activity. Eating habits. History of falls. Memory and ability to understand (cognition). Work and work Astronomer. Reproductive health. Screening  You may have the following tests or measurements: Height, weight, and BMI. Blood pressure. Lipid and cholesterol levels. These may be checked every 5 years, or more frequently if you are over 54 years old. Skin check. Lung cancer screening. You may have this screening every year starting at age 16 if you have a 30-pack-year history of smoking and currently smoke or have quit within the past 15 years. Fecal occult blood test (FOBT) of the stool. You may have this test every year starting at age 40. Flexible sigmoidoscopy or colonoscopy. You may have a sigmoidoscopy every 5 years or a colonoscopy every 10 years starting at age 75. Hepatitis C blood test. Hepatitis B blood test. Sexually transmitted disease (STD) testing. Diabetes screening. This is done by checking your blood sugar (glucose) after you have not eaten for a while (fasting). You may have this done every 1-3 years. Bone density scan. This is done to screen for osteoporosis. You may have this done starting at age 72. Mammogram. This may be done every 1-2 years. Talk to your health care provider about how often you should have regular mammograms. Talk with your health care provider about your test results, treatment options, and if necessary, the need for more tests. Vaccines  Your health care provider may recommend certain vaccines, such as: Influenza vaccine. This is recommended every year. Tetanus, diphtheria, and acellular pertussis (Tdap, Td) vaccine. You may need a Td booster every 10 years. Zoster vaccine. You may need this after age 25. Pneumococcal 13-valent conjugate (PCV13) vaccine. One dose is recommended after age 46. Pneumococcal polysaccharide (PPSV23) vaccine. One dose is recommended after age 72. Talk to  your health care provider about which  screenings and vaccines you need and how often you need them. This information is not intended to replace advice given to you by your health care provider. Make sure you discuss any questions you have with your health care provider. Document Released: 12/24/2015 Document Revised: 08/16/2016 Document Reviewed: 09/28/2015 Elsevier Interactive Patient Education  2017 [employer] Prevention in the Home Falls can cause injuries. They can happen to people of all ages. There are many things you can do to make your home safe and to help prevent falls. What can I do on the outside of my home? Regularly fix the edges of walkways and driveways and fix any cracks. Remove anything that might make you trip as you walk through a door, such as a raised step or threshold. Trim any bushes or trees on the path to your home. Use bright outdoor lighting. Clear any walking paths of anything that might make someone trip, such as rocks or tools. Regularly check to see if handrails are loose or broken. Make sure that both sides of any steps have handrails. Any raised decks and porches should have guardrails on the edges. Have any leaves, snow, or ice cleared regularly. Use sand or salt on walking paths during winter. Clean up any spills in your garage right away. This includes oil or grease spills. What can I do in the bathroom? Use night lights. Install grab bars by the toilet and in the tub and shower. Do not use towel bars as grab bars. Use non-skid mats or decals in the tub or shower. If you need to sit down in the shower, use a plastic, non-slip stool. Keep the floor dry. Clean up any water that spills on the floor as soon as it happens. Remove soap buildup in the tub or shower regularly. Attach bath mats securely with double-sided non-slip rug tape. Do not have throw rugs and other things on the floor that can make you trip. What can I do in the bedroom? Use night lights. Make sure that you have a  light by your bed that is easy to reach. Do not use any sheets or blankets that are too big for your bed. They should not hang down onto the floor. Have a firm chair that has side arms. You can use this for support while you get dressed. Do not have throw rugs and other things on the floor that can make you trip. What can I do in the kitchen? Clean up any spills right away. Avoid walking on wet floors. Keep items that you use a lot in easy-to-reach places. If you need to reach something above you, use a strong step stool that has a grab bar. Keep electrical cords out of the way. Do not use floor polish or wax that makes floors slippery. If you must use wax, use non-skid floor wax. Do not have throw rugs and other things on the floor that can make you trip. What can I do with my stairs? Do not leave any items on the stairs. Make sure that there are handrails on both sides of the stairs and use them. Fix handrails that are broken or loose. Make sure that handrails are as long as the stairways. Check any carpeting to make sure that it is firmly attached to the stairs. Fix any carpet that is loose or worn. Avoid having throw rugs at the top or bottom of the stairs. If you do have throw rugs, attach  them to the floor with carpet tape. Make sure that you have a light switch at the top of the stairs and the bottom of the stairs. If you do not have them, ask someone to add them for you. What else can I do to help prevent falls? Wear shoes that: Do not have high heels. Have rubber bottoms. Are comfortable and fit you well. Are closed at the toe. Do not wear sandals. If you use a stepladder: Make sure that it is fully opened. Do not climb a closed stepladder. Make sure that both sides of the stepladder are locked into place. Ask someone to hold it for you, if possible. Clearly mark and make sure that you can see: Any grab bars or handrails. First and last steps. Where the edge of each step  is. Use tools that help you move around (mobility aids) if they are needed. These include: Canes. Walkers. Scooters. Crutches. Turn on the lights when you go into a dark area. Replace any light bulbs as soon as they burn out. Set up your furniture so you have a clear path. Avoid moving your furniture around. If any of your floors are uneven, fix them. If there are any pets around you, be aware of where they are. Review your medicines with your doctor. Some medicines can make you feel dizzy. This can increase your chance of falling. Ask your doctor what other things that you can do to help prevent falls. This information is not intended to replace advice given to you by your health care provider. Make sure you discuss any questions you have with your health care provider. Document Released: 09/23/2009 Document Revised: 05/04/2016 Document Reviewed: 01/01/2015 Elsevier Interactive Patient Education  2017 [employer].

## 2021-11-22 NOTE — Progress Notes (Signed)
Virtual Visit via Telephone Note  I connected with  Teresa Shields on 11/22/21 at  2:20 PM EST by telephone and verified that I am speaking with the correct person using two identifiers.  Location: Patient: home Provider: [facility] Persons participating in the virtual visit: [place]   I discussed the limitations, risks, security and privacy concerns of performing an evaluation and management service by telephone and the availability of in person appointments. The patient expressed understanding and agreed to proceed.  Interactive audio and video telecommunications were attempted between this nurse and patient, however failed, due to patient having technical difficulties OR patient did not have access to video capability.  We continued and completed visit with audio only.  Some vital signs may be absent or patient reported.   Dionisio David, LPN  Subjective:   Teresa Shields is a 85 y.o. female who presents for Medicare Annual (Subsequent) preventive examination.  Review of Systems           Objective:    There were no vitals filed for this visit. There is no height or weight on file to calculate BMI.  Advanced Directives 03/18/2020 04/30/2018 04/11/2018 04/11/2018 04/09/2018 04/09/2018 04/02/2018  Does Patient Have a Medical Advance Directive? No No No Yes Yes Yes No  Type of Advance Directive - - - - - - -  Does patient want to make changes to medical advance directive? - No - Patient declined No - Patient declined No - Patient declined - - -  Would patient like information on creating a medical advance directive? No - Patient declined No - Patient declined No - Patient declined No - Patient declined No - Patient declined No - Patient declined No - Patient declined    Current Medications (verified) Outpatient Encounter Medications as of 11/22/2021  Medication Sig   acetaminophen (TYLENOL) 500 MG tablet Take 1,000 mg by mouth 2 (two) times daily as needed (for pain.).    allopurinol (ZYLOPRIM) 300 MG tablet TAKE 1 TABLET EVERY DAY   Ascorbic Acid (VITAMIN C) 1000 MG tablet Take 1,000 mg by mouth daily.   aspirin EC 81 MG tablet Take 81 mg by mouth daily.   atorvastatin (LIPITOR) 40 MG tablet TAKE 1 TABLET EVERY EVENING   calcium-vitamin D (OSCAL WITH D) 500-200 MG-UNIT tablet Take 1 tablet by mouth daily with breakfast.    colchicine 0.6 MG tablet 2 tablets at first sign of gout, then one daily as needed   glipiZIDE (GLUCOTROL XL) 2.5 MG 24 hr tablet Take 1 tablet (2.5 mg total) by mouth daily with breakfast.   HYDROcodone-acetaminophen (NORCO/VICODIN) 5-325 MG tablet Take 1-2 tablets by mouth every 4 (four) hours as needed for moderate pain.   indomethacin (INDOCIN) 25 MG capsule Take 1-2 capsules (25-50 mg total) by mouth 2 (two) times daily with a meal. For gout   metoprolol succinate (TOPROL-XL) 50 MG 24 hr tablet TAKE 1 TABLET EVERY DAY FOR HIGH BLOOD PRESSURE   Multiple Vitamin (MULTIVITAMIN WITH MINERALS) TABS tablet Take 1 tablet by mouth daily.   Omega-3 Fatty Acids (FISH OIL) 1000 MG CAPS Take 1,000 mg by mouth daily.   pregabalin (LYRICA) 75 MG capsule Take 1 capsule (75 mg total) by mouth 3 (three) times daily.   triamterene-hydrochlorothiazide (MAXZIDE) 75-50 MG tablet TAKE 1 TABLET EVERY DAY   Vitamins/Minerals TABS Take 1 tablet by mouth daily.    No facility-administered encounter medications on file as of 11/22/2021.    Allergies (verified) Metformin  and related and Naproxen   History: Past Medical History:  Diagnosis Date   Gout    History of chicken pox    Hyperlipidemia    Hypertension    Past Surgical History:  Procedure Laterality Date   Bone Density Study  01/08/2011   T-1.2, L-Spine, T-1.9 Osteopenia   CARPAL TUNNEL RELEASE Right    CATARACT EXTRACTION, BILATERAL     Left hip skin cancer resection  2010   Dermatology   ORIF ANKLE FRACTURE Right 04/11/2018   Procedure: OPEN REDUCTION INTERNAL FIXATION (ORIF) ANKLE  FRACTURE;  Surgeon: Kennedy Bucker, MD;  Location: [facility] ORS;  Service: Orthopedics;  Laterality: Right;   TUBAL LIGATION     Family History  Problem Relation Age of Onset   AAA (abdominal aortic aneurysm) Sister    Healthy Mother    Healthy Father    Gout Other    Social History   Socioeconomic History   Marital status: Widowed    Spouse name: Not on file   Number of children: 2   Years of education: Not on file   Highest education level: 12th grade  Occupational History   Occupation: retired  Tobacco Use   Smoking status: Never   Smokeless tobacco: Never  Vaping Use   Vaping Use: Never used  Substance and Sexual Activity   Alcohol use: No   Drug use: No   Sexual activity: Not on file  Other Topics Concern   Not on file  Social History Narrative   Not on file   Social Determinants of Health   Financial Resource Strain: Low Risk    Difficulty of Paying Living Expenses: Not hard at all  Food Insecurity: No Food Insecurity   Worried About Programme researcher, broadcasting/film/video in the Last Year: Never true   Ran Out of Food in the Last Year: Never true  Transportation Needs: No Transportation Needs   Lack of Transportation (Medical): No   Lack of Transportation (Non-Medical): No  Physical Activity: Sufficiently Active   Days of Exercise per Week: 7 days   Minutes of Exercise per Session: 30 min  Stress: No Stress Concern Present   Feeling of Stress : Not at all  Social Connections: Socially Isolated   Frequency of Communication with Friends and Family: More than three times a week   Frequency of Social Gatherings with Friends and Family: More than three times a week   Attends Religious Services: Never   Database administrator or Organizations: No   Attends Banker Meetings: Never   Marital Status: Widowed    Tobacco Counseling Counseling given: Not Answered   Clinical Intake:  Pre-visit preparation completed: Yes  Pain : No/denies pain     Diabetes: Yes CBG  done?: No Did pt. bring in CBG monitor from home?: No  How often do you need to have someone help you when you read instructions, pamphlets, or other written materials from your doctor or pharmacy?: 1 - Never  Diabetic?yes Nutrition Risk Assessment:  Has the patient had any N/V/D within the last 2 months?  No  Does the patient have any non-healing wounds?  No  Has the patient had any unintentional weight loss or weight gain?  No   Diabetes:  Is the patient diabetic?  Yes  If diabetic, was a CBG obtained today?  No  Did the patient bring in their glucometer from home?  No  How often do you monitor your CBG's? Doesn't check it  Financial Strains and Diabetes Management:  Are you having any financial strains with the device, your supplies or your medication? No .  Does the patient want to be seen by Chronic Care Management for management of their diabetes?  No  Would the patient like to be referred to a Nutritionist or for Diabetic Management?  No   Diabetic Exams:  Diabetic Eye Exam: Completed no. Overdue for diabetic eye exam. Pt has been advised about the importance in completing this exam.   Diabetic Foot Exam: Completed no. Pt has been advised about the importance in completing this exam. Interpreter Needed?: No  Information entered by :: Kennedy Bucker, LPN   Activities of Daily Living In your present state of health, do you have any difficulty performing the following activities: 03/14/2021  Hearing? N  Vision? N  Difficulty concentrating or making decisions? N  Walking or climbing stairs? Y  Dressing or bathing? N  Doing errands, shopping? Y  Some recent data might be hidden    Patient Care Team: [name], MD as PCP - General (Family Medicine) Juanell Fairly, RN as Case Manager  Indicate any recent Medical Services you may have received from other than [facility] providers in the past year (date may be approximate).     Assessment:   This is a routine  wellness examination for Teresa Shields.  Hearing/Vision screen No results found.  Dietary issues and exercise activities discussed:     Goals Addressed             This Visit's Progress    DIET - EAT MORE FRUITS AND VEGETABLES         Depression Screen PHQ 2/9 Scores 11/22/2021 03/14/2021 05/04/2020 03/18/2020 07/26/2018 01/15/2018 01/15/2018  PHQ - 2 Score 0 0 0 0 0 0 0  PHQ- 9 Score - 0 - - 0 0 -    Fall Risk Fall Risk  03/18/2021 03/14/2021 02/15/2021 05/04/2020 03/18/2020  Falls in the past year? 0 0 0 0 0  Number falls in past yr: 0 0 0 - 0  Injury with Fall? - 0 - - 0  Risk for fall due to : Impaired balance/gait;Medication side effect - Impaired balance/gait;Other (Comment) Impaired balance/gait -  Risk for fall due to: Comment - - Reports increased gout flares over the past month - -  Follow up Falls prevention discussed - Falls prevention discussed Falls prevention discussed -    FALL RISK PREVENTION PERTAINING TO THE HOME:  Any stairs in or around the home? Yes  If so, are there any without handrails? No  Home free of loose throw rugs in walkways, pet beds, electrical cords, etc? Yes  Adequate lighting in your home to reduce risk of falls? Yes   ASSISTIVE DEVICES UTILIZED TO PREVENT FALLS:  Life alert? No  Use of a cane, walker or w/c? Yes  Grab bars in the bathroom? No  Shower chair or bench in shower? Yes  Elevated toilet seat or a handicapped toilet? Yes    Cognitive Function:Normal cognitive status assessed by direct observation by this Nurse Health Advisor. No abnormalities found.       6CIT Screen 01/05/2017  What Year? 0 points  What month? 0 points  What time? 0 points  Count back from 20 0 points  Months in reverse 4 points  Repeat phrase 2 points  Total Score 6    Immunizations Immunization History  Administered Date(s) Administered   Fluad Quad(high Dose 65+) 09/17/2019, 09/27/2020, 11/01/2021  Influenza, High Dose Seasonal PF 09/30/2015, 09/19/2016,  09/11/2017, 09/24/2018   PFIZER(Purple Top)SARS-COV-2 Vaccination 01/23/2020, 02/17/2020   Pneumococcal Conjugate-13 09/02/2014   Pneumococcal Polysaccharide-23 01/05/2017   Tdap 08/28/2012   Zoster, Live 08/28/2012    TDAP status: Up to date  Flu Vaccine status: Up to date  Pneumococcal vaccine status: Up to date  Covid-19 vaccine status: Completed vaccines  Qualifies for Shingles Vaccine? Yes   Zostavax completed Yes   Shingrix Completed?: No.    Education has been provided regarding the importance of this vaccine. Patient has been advised to call insurance company to determine out of pocket expense if they have not yet received this vaccine. Advised may also receive vaccine at local pharmacy or Health Dept. Verbalized acceptance and understanding.  Screening Tests Health Maintenance  Topic Date Due   FOOT EXAM  Never done   OPHTHALMOLOGY EXAM  Never done   Zoster Vaccines- Shingrix (1 of 2) Never done   COVID-19 Vaccine (3 - Pfizer risk series) 03/16/2020   URINE MICROALBUMIN  03/22/2021   HEMOGLOBIN A1C  05/01/2022   TETANUS/TDAP  08/28/2022   DEXA SCAN  10/06/2022   Pneumonia Vaccine 52+ Years old  Completed   INFLUENZA VACCINE  Completed   HPV VACCINES  Aged Out    Health Maintenance  Health Maintenance Due  Topic Date Due   FOOT EXAM  Never done   OPHTHALMOLOGY EXAM  Never done   Zoster Vaccines- Shingrix (1 of 2) Never done   COVID-19 Vaccine (3 - Pfizer risk series) 03/16/2020   URINE MICROALBUMIN  03/22/2021    Colorectal cancer screening: No longer required.   Mammogram status: No longer required due to pt declines.  Bone Density status: Completed 10/07/19. Results reflect: Bone density results: OSTEOPENIA. Repeat every 5 years.  Lung Cancer Screening: (Low Dose CT Chest recommended if Age 14-80 years, 30 pack-year currently smoking OR have quit w/in 15years.) does not qualify.    Additional Screening:  Hepatitis C Screening: does qualify;  Completed no  Vision Screening: Recommended annual ophthalmology exams for early detection of glaucoma and other disorders of the eye. Is the patient up to date with their annual eye exam?  No  Who is the provider or what is the name of the office in which the patient attends annual eye exams? Doesn't have one currently If pt is not established with a provider, would they like to be referred to a provider to establish care? No .   Dental Screening: Recommended annual dental exams for proper oral hygiene  Community Resource Referral / Chronic Care Management: CRR required this visit?  No   CCM required this visit?  No      Plan:     I have personally reviewed and noted the following in the patients chart:   Medical and social history Use of alcohol, tobacco or illicit drugs  Current medications and supplements including opioid prescriptions.  Functional ability and status Nutritional status Physical activity Advanced directives List of other physicians Hospitalizations, surgeries, and ER visits in previous 12 months Vitals Screenings to include cognitive, depression, and falls Referrals and appointments  In addition, I have reviewed and discussed with patient certain preventive protocols, quality metrics, and best practice recommendations. A written personalized care plan for preventive services as well as general preventive health recommendations were provided to patient.     Dionisio David, LPN   [id number]   Nurse Notes: none

## 2021-12-04 ENCOUNTER — Other Ambulatory Visit: Payer: Self-pay | Admitting: Family Medicine

## 2022-01-05 ENCOUNTER — Ambulatory Visit (INDEPENDENT_AMBULATORY_CARE_PROVIDER_SITE_OTHER): Payer: Medicare HMO

## 2022-01-05 DIAGNOSIS — I10 Essential (primary) hypertension: Secondary | ICD-10-CM

## 2022-01-05 DIAGNOSIS — E782 Mixed hyperlipidemia: Secondary | ICD-10-CM

## 2022-01-05 DIAGNOSIS — E1140 Type 2 diabetes mellitus with diabetic neuropathy, unspecified: Secondary | ICD-10-CM

## 2022-01-05 NOTE — Patient Instructions (Signed)
Thank you for allowing the Chronic Care Management team to participate in your care. It was great speaking with you today! °

## 2022-01-05 NOTE — Chronic Care Management (AMB) (Signed)
Chronic Care Management   CCM RN Visit Note  01/05/2022 Name: Teresa Shields MRN: [id number] DOB: 1936-03-24  Subjective: Teresa Shields is a 86 y.o. year old female who is a primary care patient of [name], Kirstie Peri, MD. The care management team was consulted for assistance with disease management and care coordination needs.    Engaged with patient by telephone for follow up visit in response to provider referral for case management and care coordination services.   Consent to Services:  The patient was given information about Chronic Care Management services, agreed to services, and gave verbal consent prior to initiation of services.  Please see initial visit note for detailed documentation.    Assessment: Review of patient past medical history, allergies, medications, health status, including review of consultants reports, laboratory and other test data, was performed as part of comprehensive evaluation and provision of chronic care management services.   SDOH (Social Determinants of Health) assessments and interventions performed: No CCM Care Plan  Allergies  Allergen Reactions   Metformin And Related Diarrhea and Nausea Only   Naproxen Diarrhea    Outpatient Encounter Medications as of 01/05/2022  Medication Sig   acetaminophen (TYLENOL) 500 MG tablet Take 1,000 mg by mouth 2 (two) times daily as needed (for pain.).   allopurinol (ZYLOPRIM) 300 MG tablet TAKE 1 TABLET EVERY DAY   Ascorbic Acid (VITAMIN C) 1000 MG tablet Take 1,000 mg by mouth daily.   aspirin EC 81 MG tablet Take 81 mg by mouth daily.   atorvastatin (LIPITOR) 40 MG tablet TAKE 1 TABLET EVERY EVENING   calcium-vitamin D (OSCAL WITH D) 500-200 MG-UNIT tablet Take 1 tablet by mouth daily with breakfast.    colchicine 0.6 MG tablet 2 tablets at first sign of gout, then one daily as needed   glipiZIDE (GLUCOTROL XL) 2.5 MG 24 hr tablet Take 1 tablet (2.5 mg total) by mouth daily with breakfast.    HYDROcodone-acetaminophen (NORCO/VICODIN) 5-325 MG tablet Take 1-2 tablets by mouth every 4 (four) hours as needed for moderate pain.   indomethacin (INDOCIN) 25 MG capsule Take 1-2 capsules (25-50 mg total) by mouth 2 (two) times daily with a meal. For gout   metoprolol succinate (TOPROL-XL) 50 MG 24 hr tablet TAKE 1 TABLET EVERY DAY FOR HIGH BLOOD PRESSURE   Multiple Vitamin (MULTIVITAMIN WITH MINERALS) TABS tablet Take 1 tablet by mouth daily.   Omega-3 Fatty Acids (FISH OIL) 1000 MG CAPS Take 1,000 mg by mouth daily.   pregabalin (LYRICA) 75 MG capsule Take 1 capsule (75 mg total) by mouth 3 (three) times daily.   triamterene-hydrochlorothiazide (MAXZIDE) 75-50 MG tablet TAKE 1 TABLET EVERY DAY   Vitamins/Minerals TABS Take 1 tablet by mouth daily.    No facility-administered encounter medications on file as of 01/05/2022.    Patient Active Problem List   Diagnosis Date Noted   CKD (chronic kidney disease) stage 3, GFR 30-59 ml/min (HCC) 03/22/2020   Ankle fracture 05/08/2018   Bimalleolar ankle fracture 04/11/2018      03/08/2018   Diabetes mellitus with nephropathy ([place]) 01/17/2018   Arthralgia 07/04/2016   Edema 07/04/2016   Osteopenia 06/27/2015   Type 2 diabetes mellitus with diabetic neuropathy, without long-term current use of insulin ([place]) 06/18/2015   Hypokalemia 06/18/2015   Lumbago 06/18/2015   Neuralgia 06/18/2015   Allergic rhinitis 03/18/2010   Hypercalcemia 09/22/2009   Gout 01/15/2008   Hyperlipidemia, mixed 01/15/2008   Hypertension, essential, benign 01/15/2008   Malignant neoplasm of  skin of leg 01/15/2008    Patient Care Plan: RN Care Management Plan of Care     Problem Identified: DM, HLD, HTN      Long-Range Goal: Disease Progression Prevented or Minimized   Start Date: 01/05/2022  Expected End Date: 04/05/2022  Priority: High  Note:   Current Barriers:  Chronic Disease Management support and education needs related to HTN, HLD, and DMII.  RNCM  Clinical Goal(s):  Patient will demonstrate improved adherence to prescribed treatment plan for HTN, HLD, and DMII through collaboration with the provider, [place], and the care team.   Interventions: 1:1 collaboration with primary care provider regarding development and update of comprehensive plan of care as evidenced by provider attestation and co-signature Inter-disciplinary care team collaboration (see longitudinal plan of care) Evaluation of current treatment plan related to  self management and patient's adherence to plan as established by provider   Diabetes Interventions:   Assessed patient's understanding of A1c goal: <7% Lab Results  Component Value Date   HGBA1C 7.2 (A) 11/01/2021  Reviewed plan for diabetes management. Reports compliance with medications but prefers not to monitor her fasting blood glucose levels. Agreed to notify the team if she requires assistance with setting up her glucometer.  Reviewed s/sx of hypoglycemia and hyperglycemia along with recommended interventions. Reviewed nutritional intake and discussed importance of complying with a diabetic/ADA diet. Reports good intake. Reports her daughter is available to assist with meals when needed. Declined need for additional diabetic resources.    Hyperlipidemia Interventions:   Lab Results  Component Value Date   CHOL 167 03/14/2021   HDL 34 (L) 03/14/2021   LDLCALC 71 03/14/2021   TRIG 394 (H) 03/14/2021   CHOLHDL 4.9 (H) 03/14/2021    Medication review performed; medication list updated in electronic medical record.  Provider established cholesterol goals reviewed Counseled on importance of regular laboratory monitoring as prescribed Reviewed importance of limiting foods high in cholesterol   Hypertension Interventions:   Last practice recorded BP readings:  BP Readings from Last 3 Encounters:  11/01/21 (!) 139/58  06/24/21 109/68  03/14/21 (!) 159/56  Most recent eGFR/CrCl:  Lab Results   Component Value Date   EGFR 48 (L) 03/14/2021    No components found for: CRCL  Reviewed medications and plan for hypertension management. Reports recently changing insurance providers and mail delivery pharmacies. Reports taking all medications as prescribed and tolerating regimen well.  Reviewed established blood pressure parameters along with indications for notifying a provider. Advised to monitor and record readings.  Reviewed nutritional intake and importance of complying with a cardiac prudent/heart healthy diet. Encouraged to read nutrition labels, monitor sodium intake, and avoid highly processed foods when possible. Reviewed s/sx of heart attack, stroke and worsening symptoms that require immediate medical attention.   Patient Goals/Self-Care Activities: Take all medications as prescribed Attend all scheduled provider appointments Call pharmacy for medication refills 3-7 days in advance of running out of medications Perform all self care activities independently  Call provider office for new concerns or questions    Follow Up Plan:   Will follow up in three months      PLAN A member of the care management team will follow up in three months.   Cristy [facility] Care Management [facility] [phone]

## 2022-01-10 DIAGNOSIS — E782 Mixed hyperlipidemia: Secondary | ICD-10-CM

## 2022-01-10 DIAGNOSIS — E1140 Type 2 diabetes mellitus with diabetic neuropathy, unspecified: Secondary | ICD-10-CM

## 2022-01-10 DIAGNOSIS — Z7984 Long term (current) use of oral hypoglycemic drugs: Secondary | ICD-10-CM

## 2022-01-10 DIAGNOSIS — I10 Essential (primary) hypertension: Secondary | ICD-10-CM

## 2022-01-25 ENCOUNTER — Telehealth: Payer: Self-pay | Admitting: Family Medicine

## 2022-02-06 ENCOUNTER — Other Ambulatory Visit: Payer: Self-pay | Admitting: Family Medicine

## 2022-02-06 DIAGNOSIS — E1140 Type 2 diabetes mellitus with diabetic neuropathy, unspecified: Secondary | ICD-10-CM

## 2022-02-06 NOTE — Telephone Encounter (Signed)
Medication Refill - Medication: 90 day supply pregabalin (LYRICA) 75 MG capsule    Has the patient contacted their pharmacy?  Yes   (Agent: If yes, when and what did the pharmacy advise?)  The pharmacy actually sent patient a letter dated for 01/30/2022 stating a refill request was sent over but no response and to reach out to your PCP   Preferred Pharmacy (with phone number or street name): This is the information give by the caller and the information reflected on the letter did not find this in the preferred pharmacy search  [facility] [address] [address] [place] [phone]   Has the patient been seen for an appointment in the last year OR does the patient have an upcoming appointment? Yes.    Agent: Please be advised that RX refills may take up to 3 business days. We ask that you follow-up with your pharmacy.

## 2022-02-07 NOTE — Telephone Encounter (Signed)
°  Requested medication (s) are due for refill today: yes  Requested medication (s) are on the active medication list: yes  Last refill:  09/01/21 #270 with 1 RF  Future visit scheduled: 04/03/22  Notes to clinic:  This medication can not be delegated, please assess.          Requested Prescriptions  Pending Prescriptions Disp Refills   pregabalin (LYRICA) 75 MG capsule 270 capsule 1    Sig: Take 1 capsule (75 mg total) by mouth 3 (three) times daily.     Not Delegated - Neurology:  Anticonvulsants - Controlled - pregabalin Failed - 02/07/2022  1:01 PM      Failed - This refill cannot be delegated      Failed - Cr in normal range and within 360 days    Creatinine, Ser  Date Value Ref Range Status  03/14/2021 1.15 (H) 0.57 - 1.00 mg/dL Final          Passed - Completed PHQ-2 or PHQ-9 in the last 360 days      Passed - Valid encounter within last 12 months    Recent Outpatient Visits           3 months ago Type 2 diabetes mellitus with diabetic neuropathy, without long-term current use of insulin ([place])   [facility] Birdie Sons, MD   7 months ago Type 2 diabetes mellitus with diabetic neuropathy, without long-term current use of insulin ([place])   [facility] Birdie Sons, MD   11 months ago Type 2 diabetes mellitus with diabetic neuropathy, without long-term current use of insulin ([place])   [facility] Birdie Sons, MD   1 year ago Type 2 diabetes mellitus with diabetic neuropathy, without long-term current use of insulin [facility])   [name] Birdie Sons, MD   1 year ago Type 2 diabetes mellitus with diabetic neuropathy, without long-term current use of insulin [facility])   [facility] Birdie Sons, MD       Future Appointments             In 1 month [name], Kirstie Peri, MD [facility], [place]

## 2022-02-07 NOTE — Telephone Encounter (Signed)
Requested medication (s) are due for refill today - no  Requested medication (s) are on the active medication list -yes  Future visit scheduled -yes  Last refill: 09/01/21 #270 1RF  Notes to clinic: Request RF: non delegated Rx- call to patient to verify pharmacy- daughter answered and verified [facility] is correct pharmacy- changed with insurance change this year.  Requested Prescriptions  Pending Prescriptions Disp Refills   pregabalin (LYRICA) 75 MG capsule 270 capsule 1    Sig: Take 1 capsule (75 mg total) by mouth 3 (three) times daily.     Not Delegated - Neurology:  Anticonvulsants - Controlled - pregabalin Failed - 02/07/2022  1:01 PM      Failed - This refill cannot be delegated      Failed - Cr in normal range and within 360 days    Creatinine, Ser  Date Value Ref Range Status  03/14/2021 1.15 (H) 0.57 - 1.00 mg/dL Final          Passed - Completed PHQ-2 or PHQ-9 in the last 360 days      Passed - Valid encounter within last 12 months    Recent Outpatient Visits           3 months ago Type 2 diabetes mellitus with diabetic neuropathy, without long-term current use of insulin (HCC)   [facility] [name], MD   7 months ago Type 2 diabetes mellitus with diabetic neuropathy, without long-term current use of insulin (HCC)   [facility] [name], MD   11 months ago Type 2 diabetes mellitus with diabetic neuropathy, without long-term current use of insulin (HCC)   [facility] [name], MD   1 year ago Type 2 diabetes mellitus with diabetic neuropathy, without long-term current use of insulin (HCC)   [facility] [name], MD   1 year ago Type 2 diabetes mellitus with diabetic neuropathy, without long-term current use of insulin [facility])   [facility] [name], MD       Future Appointments             In 1 month [name], [name], MD [facility],  [facility]               Requested Prescriptions  Pending Prescriptions Disp Refills   pregabalin (LYRICA) 75 MG capsule 270 capsule 1    Sig: Take 1 capsule (75 mg total) by mouth 3 (three) times daily.     Not Delegated - Neurology:  Anticonvulsants - Controlled - pregabalin Failed - 02/07/2022  1:01 PM      Failed - This refill cannot be delegated      Failed - Cr in normal range and within 360 days    Creatinine, Ser  Date Value Ref Range Status  03/14/2021 1.15 (H) 0.57 - 1.00 mg/dL Final          Passed - Completed PHQ-2 or PHQ-9 in the last 360 days      Passed - Valid encounter within last 12 months    Recent Outpatient Visits           3 months ago Type 2 diabetes mellitus with diabetic neuropathy, without long-term current use of insulin [facility])   [facility] [name], MD   7 months ago Type 2 diabetes mellitus with diabetic neuropathy, without long-term current use of insulin [facility])   [facility] Sherrie Mustache, [name], MD  11 months ago Type 2 diabetes mellitus with diabetic neuropathy, without long-term current use of insulin [facility])   [facility] [name], MD   1 year ago Type 2 diabetes mellitus with diabetic neuropathy, without long-term current use of insulin [facility])   [facility] [name], MD   1 year ago Type 2 diabetes mellitus with diabetic neuropathy, without long-term current use of insulin [facility])   [facility] [name], MD       Future Appointments             In 1 month [name], [name], MD [facility], [facility]

## 2022-02-07 NOTE — Telephone Encounter (Signed)
Need to place call to patient to verify pharmacy

## 2022-02-08 MED ORDER — PREGABALIN 75 MG PO CAPS: 75.0000 mg | ORAL_CAPSULE | Freq: Three times a day (TID) | ORAL | 1 refills | Status: DC

## 2022-02-09 NOTE — Telephone Encounter (Signed)
Opened in error

## 2022-04-03 ENCOUNTER — Encounter: Payer: Self-pay | Admitting: Family Medicine

## 2022-04-03 ENCOUNTER — Ambulatory Visit (INDEPENDENT_AMBULATORY_CARE_PROVIDER_SITE_OTHER): Payer: Medicare Other | Admitting: Family Medicine

## 2022-04-03 VITALS — BP 135/60 | HR 74 | Temp 97.9°F | Resp 14 | Wt 170.0 lb

## 2022-04-03 DIAGNOSIS — E782 Mixed hyperlipidemia: Secondary | ICD-10-CM

## 2022-04-03 DIAGNOSIS — I10 Essential (primary) hypertension: Secondary | ICD-10-CM

## 2022-04-03 DIAGNOSIS — E1140 Type 2 diabetes mellitus with diabetic neuropathy, unspecified: Secondary | ICD-10-CM

## 2022-04-03 DIAGNOSIS — N183 Chronic kidney disease, stage 3 unspecified: Secondary | ICD-10-CM

## 2022-04-03 DIAGNOSIS — M1612 Unilateral primary osteoarthritis, left hip: Secondary | ICD-10-CM

## 2022-04-03 DIAGNOSIS — Z7409 Other reduced mobility: Secondary | ICD-10-CM

## 2022-04-03 DIAGNOSIS — R269 Unspecified abnormalities of gait and mobility: Secondary | ICD-10-CM

## 2022-04-03 DIAGNOSIS — E790 Hyperuricemia without signs of inflammatory arthritis and tophaceous disease: Secondary | ICD-10-CM

## 2022-04-03 NOTE — Progress Notes (Signed)
?  ? ?I,Roshena L Chambers,acting as a scribe for Lelon Huh, MD.,have documented all relevant documentation on the behalf of Lelon Huh, MD,as directed by  Lelon Huh, MD while in the presence of Lelon Huh, MD.  ? ? ?Established patient visit ? ? ?Patient: Teresa Shields   DOB: November 19, 1936   86 y.o. Female  MRN: [id number] ?Visit Date: 04/03/2022 ? ?Today's healthcare provider: Lelon Huh, MD  ? ?Chief Complaint  ?Patient presents with  ? Hypertension  ? Diabetes  ? ?Subjective  ?  ?HPI  ?Diabetes Mellitus Type II, Follow-up ? ?Lab Results  ?Component Value Date  ? HGBA1C 7.2 (A) 11/01/2021  ? HGBA1C 7.4 (A) 06/24/2021  ? HGBA1C 7.7 (A) 03/14/2021  ? ?Wt Readings from Last 3 Encounters:  ?04/03/22 170 lb (77.1 kg)  ?11/01/21 166 lb 4.8 oz (75.4 kg)  ?06/24/21 167 lb (75.8 kg)  ? ?Last seen for diabetes 5 months ago.  ?Management since then includes changing from Metformin to glipizide due to GI side effects. ?She reports poor compliance with treatment. Patient is unsure if she is taking glipizide. ?She is not having side effects.  ?Symptoms: ?No fatigue No foot ulcerations  ?No appetite changes No nausea  ?No paresthesia of the feet  No polydipsia  ?No polyuria No visual disturbances   ?No vomiting   ? ? ?Home blood sugar records:  blood sugars are not checked ? ?Episodes of hypoglycemia? No  ?  ?Current insulin regiment: none ?Most Recent Eye Exam: >1 year ago ?Current exercise: walking ?Current diet habits: well balanced ? ?Pertinent Labs: ?Lab Results  ?Component Value Date  ? CHOL 167 03/14/2021  ? HDL 34 (L) 03/14/2021  ? [place] 71 03/14/2021  ? TRIG 394 (H) 03/14/2021  ? CHOLHDL 4.9 (H) 03/14/2021  ? Lab Results  ?Component Value Date  ? NA 142 03/14/2021  ? K 3.9 03/14/2021  ? CREATININE 1.15 (H) 03/14/2021  ? EGFR 48 (L) 03/14/2021  ? MICROALBUR 50 03/22/2020  ? LABMICR 21.4 01/15/2018  ?  ? ?---------------------------------------------------------------------------------------------------   ? ?Hypertension, follow-up ? ?BP Readings from Last 3 Encounters:  ?04/03/22 135/60  ?11/01/21 (!) 139/58  ?06/24/21 109/68  ? Wt Readings from Last 3 Encounters:  ?04/03/22 170 lb (77.1 kg)  ?11/01/21 166 lb 4.8 oz (75.4 kg)  ?06/24/21 167 lb (75.8 kg)  ?  ? ?She was last seen for hypertension 1  year  ago.  ?BP at that visit was 159/56. Management since that visit includes continuing same medication. ? ?She reports good compliance with treatment. ?She is not having side effects.  ?She is following a Regular diet. ?She is exercising. ?She does not smoke. ? ?Use of agents associated with hypertension: NSAIDS.  ? ?Outside blood pressures are 126-140/ 72-76. ?Symptoms: ?No chest pain No chest pressure  ?No palpitations No syncope  ?No dyspnea No orthopnea  ?No paroxysmal nocturnal dyspnea No lower extremity edema  ? ? ? ?---------------------------------------------------------------------------------------------------  ? ?Medications: ?Outpatient Medications Prior to Visit  ?Medication Sig  ? acetaminophen (TYLENOL) 500 MG tablet Take 1,000 mg by mouth 2 (two) times daily as needed (for pain.).  ? allopurinol (ZYLOPRIM) 300 MG tablet TAKE 1 TABLET EVERY DAY  ? Ascorbic Acid (VITAMIN C) 1000 MG tablet Take 1,000 mg by mouth daily.  ? aspirin EC 81 MG tablet Take 81 mg by mouth daily.  ? atorvastatin (LIPITOR) 40 MG tablet TAKE 1 TABLET EVERY EVENING  ? calcium-vitamin D (OSCAL WITH D) 500-200 MG-UNIT tablet Take 1  tablet by mouth daily with breakfast.   ? colchicine 0.6 MG tablet 2 tablets at first sign of gout, then one daily as needed  ? HYDROcodone-acetaminophen (NORCO/VICODIN) 5-325 MG tablet Take 1-2 tablets by mouth every 4 (four) hours as needed for moderate pain.  ? indomethacin (INDOCIN) 25 MG capsule Take 1-2 capsules (25-50 mg total) by mouth 2 (two) times daily with a meal. For gout  ? metoprolol succinate (TOPROL-XL) 50 MG 24 hr tablet TAKE 1 TABLET EVERY DAY FOR HIGH BLOOD PRESSURE  ? Multiple Vitamin  (MULTIVITAMIN WITH MINERALS) TABS tablet Take 1 tablet by mouth daily.  ? Omega-3 Fatty Acids (FISH OIL) 1000 MG CAPS Take 1,000 mg by mouth daily.  ? pregabalin (LYRICA) 75 MG capsule Take 1 capsule (75 mg total) by mouth 3 (three) times daily.  ? triamterene-hydrochlorothiazide (MAXZIDE) 75-50 MG tablet TAKE 1 TABLET EVERY DAY  ? Vitamins/Minerals TABS Take 1 tablet by mouth daily.   ? glipiZIDE (GLUCOTROL XL) 2.5 MG 24 hr tablet Take 1 tablet (2.5 mg total) by mouth daily with breakfast. (Patient not taking: Reported on 04/03/2022)  ? ?No facility-administered medications prior to visit.  ? ? ?Review of Systems  ?Constitutional:  Negative for appetite change, chills, fatigue and fever.  ?Respiratory:  Negative for chest tightness and shortness of breath.   ?Cardiovascular:  Negative for chest pain and palpitations.  ?Gastrointestinal:  Negative for abdominal pain, nausea and vomiting.  ?Neurological:  Negative for dizziness and weakness.  ? ? ?  Objective  ?  ?BP 135/60 (BP Location: Right Arm, Patient Position: Sitting, Cuff Size: Large)   Pulse 74   Temp 97.9 ?F (36.6 ?C) (Oral)   Resp 14   Wt 170 lb (77.1 kg)   SpO2 100% Comment: room air  BMI 27.44 kg/m?  ? ? ?Physical Exam  ? ?General: Appearance:     ?Well developed, well nourished female in no acute distress  ?Eyes:    PERRL, conjunctiva/corneas clear, EOM's intact       ?Lungs:     Clear to auscultation bilaterally, respirations unlabored  ?Heart:    Normal heart rate. Normal rhythm. No murmurs, rubs, or gallops.    ?MS:   All extremities are intact.  1+ bipedal edema.   ?Neurologic:   Awake, alert, oriented x 3. Requires assistance to stand.    ?   ?  ? Assessment & Plan  ?  ? ?1. Type 2 diabetes mellitus with diabetic neuropathy, without long-term current use of insulin ([place]) ? ?- Urine Albumin-Creatinine with uACR ?- Hemoglobin A1c ?- VITAMIN D 25 Hydroxy (Vit-D Deficiency, Fractures) ?- Ambulatory referral to Ophthalmology ? ?2. Hypertension,  essential, benign ?Stable. Continue current medications.   ? ?3. Stage 3 chronic kidney disease, unspecified whether stage 3a or 3b CKD ([place]) ? ?- VITAMIN D 25 Hydroxy (Vit-D Deficiency, Fractures) ? ?4. Hyperlipidemia, mixed ?She is tolerating atorvastatin well with no adverse effects.   ?- CBC ?- Comprehensive metabolic panel ?- Lipid panel ?- VITAMIN D 25 Hydroxy (Vit-D Deficiency, Fractures) ? ?5. Hyperuricemia ?Doing well with allopurinol. Most flares controlled with ibuprofen.  ?- VITAMIN D 25 Hydroxy (Vit-D Deficiency, Fractures) ?- Uric acid ? ?6. Mobility impaired ? ?- Ambulatory referral to Home Health ? ?7. Gait disturbance ? ?- Ambulatory referral to Home Health ? ?8. Primary osteoarthritis of left hip ?Was told by orthopedist that she needs hip replacement. Will try  Ambulatory referral to Home Health  ?   ? ?The entirety of  the information documented in the History of Present Illness, Review of Systems and Physical Exam were personally obtained by me. Portions of this information were initially documented by the CMA and reviewed by me for thoroughness and accuracy.   ? ? ?Lelon Huh, MD  ?[facility] ?[phone] (phone) ?[phone] (fax) ? ?[facility]  ?

## 2022-04-04 ENCOUNTER — Other Ambulatory Visit: Payer: Self-pay | Admitting: Family Medicine

## 2022-04-04 DIAGNOSIS — E1140 Type 2 diabetes mellitus with diabetic neuropathy, unspecified: Secondary | ICD-10-CM

## 2022-04-04 LAB — HEMOGLOBIN A1C
Est. average glucose Bld gHb Est-mCnc: 200 mg/dL
Hgb A1c MFr Bld: 8.6 % — ABNORMAL HIGH (ref 4.8–5.6)

## 2022-04-04 LAB — LIPID PANEL
Chol/HDL Ratio: 4.9 ratio — ABNORMAL HIGH (ref 0.0–4.4)
Cholesterol, Total: 175 mg/dL (ref 100–199)
HDL: 36 mg/dL — ABNORMAL LOW (ref >39)
LDL Chol Calc (NIH): 67 mg/dL (ref 0–99)
Triglycerides: 464 mg/dL — ABNORMAL HIGH (ref 0–149)
VLDL Cholesterol Cal: 72 mg/dL — ABNORMAL HIGH (ref 5–40)

## 2022-04-04 LAB — COMPREHENSIVE METABOLIC PANEL
ALT: 31 IU/L (ref 0–32)
AST: 26 IU/L (ref 0–40)
Albumin/Globulin Ratio: 2 (ref 1.2–2.2)
Albumin: 4.3 g/dL (ref 3.6–4.6)
Alkaline Phosphatase: 125 IU/L — ABNORMAL HIGH (ref 44–121)
BUN/Creatinine Ratio: 23 (ref 12–28)
BUN: 28 mg/dL — ABNORMAL HIGH (ref 8–27)
Bilirubin Total: 0.5 mg/dL (ref 0.0–1.2)
CO2: 26 mmol/L (ref 20–29)
Calcium: 9.9 mg/dL (ref 8.7–10.3)
Chloride: 100 mmol/L (ref 96–106)
Creatinine, Ser: 1.21 mg/dL — ABNORMAL HIGH (ref 0.57–1.00)
Globulin, Total: 2.1 g/dL (ref 1.5–4.5)
Glucose: 225 mg/dL — ABNORMAL HIGH (ref 70–99)
Potassium: 4.6 mmol/L (ref 3.5–5.2)
Sodium: 142 mmol/L (ref 134–144)
Total Protein: 6.4 g/dL (ref 6.0–8.5)
eGFR: 44 mL/min/{1.73_m2} — ABNORMAL LOW (ref >59)

## 2022-04-04 LAB — MICROALBUMIN / CREATININE URINE RATIO
Creatinine, Urine: 201.3 mg/dL
Microalb/Creat Ratio: 104 mg/g creat — ABNORMAL HIGH (ref 0–29)
Microalbumin, Urine: 208.6 ug/mL

## 2022-04-04 LAB — URIC ACID: Uric Acid: 4.4 mg/dL (ref 3.1–7.9)

## 2022-04-04 LAB — CBC
Hematocrit: 43.6 % (ref 34.0–46.6)
Hemoglobin: 15.2 g/dL (ref 11.1–15.9)
MCH: 32.2 pg (ref 26.6–33.0)
MCHC: 34.9 g/dL (ref 31.5–35.7)
MCV: 92 fL (ref 79–97)
Platelets: 259 10*3/uL (ref 150–450)
RBC: 4.72 x10E6/uL (ref 3.77–5.28)
RDW: 13.5 % (ref 11.7–15.4)
WBC: 13 10*3/uL — ABNORMAL HIGH (ref 3.4–10.8)

## 2022-04-04 LAB — VITAMIN D 25 HYDROXY (VIT D DEFICIENCY, FRACTURES): Vit D, 25-Hydroxy: 32 ng/mL (ref 30.0–100.0)

## 2022-04-04 MED ORDER — GLIPIZIDE ER 2.5 MG PO TB24: 2.5000 mg | ORAL_TABLET | Freq: Every day | ORAL | 1 refills | Status: DC

## 2022-04-05 ENCOUNTER — Telehealth: Payer: Self-pay

## 2022-04-05 MED ORDER — LOSARTAN POTASSIUM 50 MG PO TABS: 50.0000 mg | ORAL_TABLET | Freq: Every day | ORAL | 1 refills | Status: DC

## 2022-04-05 NOTE — Telephone Encounter (Signed)
Patient advised and agrees with treatment plan. Prescription sent into pharmacy. Follow up appointment scheduled for 04/26/2022.  ?

## 2022-04-05 NOTE — Telephone Encounter (Signed)
-----   Message from [name], MD sent at 04/04/2022  8:50 PM EDT ----- ?Urine test shows moderate diabetic kidney disease.need to start losartan 50mg , one tablet daily. Please send in prescription for #30 rf x 1.  ?Needs to schedule follow up regarding losartan and follow up labs in 3 weeks.  ?

## 2022-04-06 ENCOUNTER — Telehealth: Payer: Self-pay

## 2022-04-06 NOTE — Telephone Encounter (Signed)
Copied from CRM [id number]. Topic: General - Other >> Apr 06, 2022  3:59 PM Aretta Nip wrote: [facility] wanted Dr Sherrie Mustache to know that they have contacted pt and will be going out to see pt tomorrow. Will FU

## 2022-04-07 ENCOUNTER — Ambulatory Visit (INDEPENDENT_AMBULATORY_CARE_PROVIDER_SITE_OTHER): Payer: Medicare Other

## 2022-04-07 DIAGNOSIS — E1140 Type 2 diabetes mellitus with diabetic neuropathy, unspecified: Secondary | ICD-10-CM

## 2022-04-07 DIAGNOSIS — E782 Mixed hyperlipidemia: Secondary | ICD-10-CM

## 2022-04-07 DIAGNOSIS — I10 Essential (primary) hypertension: Secondary | ICD-10-CM

## 2022-04-07 NOTE — Chronic Care Management (AMB) (Addendum)
Chronic Care Management   CCM RN Visit Note  04/07/2022 Name: Teresa Shields MRN: [id number] DOB: Nov 20, 1936  Subjective: Teresa Shields is a 86 y.o. year old female who is a primary care patient of [name], Kirstie Peri, MD. The care management team was consulted for assistance with disease management and care coordination needs.    Engaged with patient by telephone for follow up visit in response to provider referral for case management and care coordination services.   Consent to Services:  The patient was given information about Chronic Care Management services, agreed to services, and gave verbal consent prior to initiation of services.  Please see initial visit note for detailed documentation.   Assessment: Review of patient past medical history, allergies, medications, health status, including review of consultants reports, laboratory and other test data, was performed as part of comprehensive evaluation and provision of chronic care management services.   SDOH (Social Determinants of Health) assessments and interventions performed: No  CCM Care Plan  Allergies  Allergen Reactions   Metformin And Related Diarrhea and Nausea Only   Naproxen Diarrhea    Outpatient Encounter Medications as of 04/07/2022  Medication Sig   acetaminophen (TYLENOL) 500 MG tablet Take 1,000 mg by mouth 2 (two) times daily as needed (for pain.).   allopurinol (ZYLOPRIM) 300 MG tablet TAKE 1 TABLET EVERY DAY   Ascorbic Acid (VITAMIN C) 1000 MG tablet Take 1,000 mg by mouth daily.   aspirin EC 81 MG tablet Take 81 mg by mouth daily.   atorvastatin (LIPITOR) 40 MG tablet TAKE 1 TABLET EVERY EVENING   calcium-vitamin D (OSCAL WITH D) 500-200 MG-UNIT tablet Take 1 tablet by mouth daily with breakfast.    colchicine 0.6 MG tablet 2 tablets at first sign of gout, then one daily as needed   glipiZIDE (GLUCOTROL XL) 2.5 MG 24 hr tablet Take 1 tablet (2.5 mg total) by mouth daily with breakfast.    HYDROcodone-acetaminophen (NORCO/VICODIN) 5-325 MG tablet Take 1-2 tablets by mouth every 4 (four) hours as needed for moderate pain.   indomethacin (INDOCIN) 25 MG capsule Take 1-2 capsules (25-50 mg total) by mouth 2 (two) times daily with a meal. For gout   losartan (COZAAR) 50 MG tablet Take 1 tablet (50 mg total) by mouth daily.   metoprolol succinate (TOPROL-XL) 50 MG 24 hr tablet TAKE 1 TABLET EVERY DAY FOR HIGH BLOOD PRESSURE   Multiple Vitamin (MULTIVITAMIN WITH MINERALS) TABS tablet Take 1 tablet by mouth daily.   Omega-3 Fatty Acids (FISH OIL) 1000 MG CAPS Take 1,000 mg by mouth daily.   pregabalin (LYRICA) 75 MG capsule Take 1 capsule (75 mg total) by mouth 3 (three) times daily.   triamterene-hydrochlorothiazide (MAXZIDE) 75-50 MG tablet TAKE 1 TABLET EVERY DAY   Vitamins/Minerals TABS Take 1 tablet by mouth daily.    No facility-administered encounter medications on file as of 04/07/2022.    Patient Active Problem List   Diagnosis Date Noted   CKD (chronic kidney disease) stage 3, GFR 30-59 ml/min (HCC) 03/22/2020   Ankle fracture 05/08/2018   Bimalleolar ankle fracture 04/11/2018      03/08/2018   Diabetes mellitus with nephropathy ([place]) 01/17/2018   Arthralgia 07/04/2016   Edema 07/04/2016   Osteopenia 06/27/2015   Type 2 diabetes mellitus with diabetic neuropathy, without long-term current use of insulin ([place]) 06/18/2015   Hypokalemia 06/18/2015   Lumbago 06/18/2015   Neuralgia 06/18/2015   Allergic rhinitis 03/18/2010   Hypercalcemia 09/22/2009   Gout 01/15/2008  Hyperlipidemia, mixed 01/15/2008   Hypertension, essential, benign 01/15/2008   Malignant neoplasm of skin of leg 01/15/2008     Patient Care Plan: RN Care Management Plan of Care     Problem Identified: DM, HLD, HTN      Long-Range Goal: Disease Progression Prevented or Minimized   Start Date: 04/07/2022  Expected End Date: 07/06/2022  Priority: High  Note:   Current Barriers:  Chronic Disease  Management support and education needs related to HTN, HLD, and DMII.  RNCM Clinical Goal(s):  Patient will demonstrate improved adherence to prescribed treatment plan for HTN, HLD, and DMII through collaboration with the provider, [place], and the care team.   Interventions: 1:1 collaboration with primary care provider regarding development and update of comprehensive plan of care as evidenced by provider attestation and co-signature Inter-disciplinary care team collaboration (see longitudinal plan of care) Evaluation of current treatment plan related to  self management and patient's adherence to plan as established by provider   Diabetes Interventions:   Assessed patient's understanding of A1c goal: <7% Reviewed plan for diabetes management. Reports excellent compliance with medications. She does not monitor blood glucose readings at home.  Reviewed s/sx of hypoglycemia and hyperglycemia along with recommended interventions. Reviewed nutritional intake and discussed importance of complying with a diabetic/ADA diet. Reports good intake. Reports monitoring intake of carbs and added sugars. Reports daughter remains available to assist with meals. Discussed recommended diabetic appointments. Reports completing foot care as recommended. Eye exam scheduled for 06/16/22.  Hyperlipidemia Interventions:   Discussed established cholesterol goals  Discussed importance of regular laboratory monitoring as prescribed Reviewed importance of limiting foods high in cholesterol. Advised to continue monitoring nutrition labels. Advised to avoid highly processed foods when possible. Discussed activity goals. Reports activity remains limited due to mobility. She is scheduled for Physical Therapy with [facility] next week.    Hypertension Interventions:   Reviewed plan for hypertension management. Reports taking medications as prescribed. Indicated a preference for losartan for BP management. Reviewed  established blood pressure parameters along with indications for notifying a provider. Advised to monitor and record readings.  Reviewed nutritional intake and importance of complying with a cardiac prudent/heart healthy diet. Encouraged to read nutrition labels, monitor sodium intake, and avoid highly processed foods when possible. Reviewed s/sx of heart attack, stroke and worsening symptoms that require immediate medical attention.    Patient Goals/Self-Care Activities: Take all medications as prescribed Attend all scheduled provider appointments Call pharmacy for medication refills 3-7 days in advance of running out of medications Perform all self care activities independently  Call provider office for new concerns or questions    Follow Up Plan:   Will follow up next  week.     PLAN: A member of the care management team will follow up next week.   C[facility] Care Management [facility] [phone]

## 2022-04-09 DIAGNOSIS — E1140 Type 2 diabetes mellitus with diabetic neuropathy, unspecified: Secondary | ICD-10-CM

## 2022-04-09 DIAGNOSIS — E782 Mixed hyperlipidemia: Secondary | ICD-10-CM

## 2022-04-09 DIAGNOSIS — I10 Essential (primary) hypertension: Secondary | ICD-10-CM | POA: Diagnosis not present

## 2022-04-11 DIAGNOSIS — E1140 Type 2 diabetes mellitus with diabetic neuropathy, unspecified: Secondary | ICD-10-CM | POA: Diagnosis not present

## 2022-04-11 DIAGNOSIS — M8580 Other specified disorders of bone density and structure, unspecified site: Secondary | ICD-10-CM | POA: Diagnosis not present

## 2022-04-11 DIAGNOSIS — E1122 Type 2 diabetes mellitus with diabetic chronic kidney disease: Secondary | ICD-10-CM | POA: Diagnosis not present

## 2022-04-11 DIAGNOSIS — J309 Allergic rhinitis, unspecified: Secondary | ICD-10-CM | POA: Diagnosis not present

## 2022-04-11 DIAGNOSIS — E876 Hypokalemia: Secondary | ICD-10-CM | POA: Diagnosis not present

## 2022-04-11 DIAGNOSIS — Z7984 Long term (current) use of oral hypoglycemic drugs: Secondary | ICD-10-CM | POA: Diagnosis not present

## 2022-04-11 DIAGNOSIS — E7800 Pure hypercholesterolemia, unspecified: Secondary | ICD-10-CM | POA: Diagnosis not present

## 2022-04-11 DIAGNOSIS — N183 Chronic kidney disease, stage 3 unspecified: Secondary | ICD-10-CM | POA: Diagnosis not present

## 2022-04-11 DIAGNOSIS — I129 Hypertensive chronic kidney disease with stage 1 through stage 4 chronic kidney disease, or unspecified chronic kidney disease: Secondary | ICD-10-CM | POA: Diagnosis not present

## 2022-04-11 DIAGNOSIS — R32 Unspecified urinary incontinence: Secondary | ICD-10-CM | POA: Diagnosis not present

## 2022-04-11 DIAGNOSIS — E782 Mixed hyperlipidemia: Secondary | ICD-10-CM | POA: Diagnosis not present

## 2022-04-11 DIAGNOSIS — M109 Gout, unspecified: Secondary | ICD-10-CM | POA: Diagnosis not present

## 2022-04-11 DIAGNOSIS — Z85828 Personal history of other malignant neoplasm of skin: Secondary | ICD-10-CM | POA: Diagnosis not present

## 2022-04-11 DIAGNOSIS — M1612 Unilateral primary osteoarthritis, left hip: Secondary | ICD-10-CM | POA: Diagnosis not present

## 2022-04-11 DIAGNOSIS — F4321 Adjustment disorder with depressed mood: Secondary | ICD-10-CM | POA: Diagnosis not present

## 2022-04-12 ENCOUNTER — Telehealth: Payer: Self-pay | Admitting: Family Medicine

## 2022-04-12 MED ORDER — ATORVASTATIN CALCIUM 40 MG PO TABS: 40.0000 mg | ORAL_TABLET | Freq: Every evening | ORAL | 2 refills | Status: DC

## 2022-04-12 NOTE — Telephone Encounter (Signed)
[facility] faxed refill request for the following medications: ? ?atorvastatin (LIPITOR) 40 MG tablet  ? ?Please advise. ? ?

## 2022-04-14 ENCOUNTER — Ambulatory Visit (INDEPENDENT_AMBULATORY_CARE_PROVIDER_SITE_OTHER): Payer: Medicare Other

## 2022-04-14 DIAGNOSIS — I10 Essential (primary) hypertension: Secondary | ICD-10-CM

## 2022-04-14 DIAGNOSIS — R269 Unspecified abnormalities of gait and mobility: Secondary | ICD-10-CM

## 2022-04-14 DIAGNOSIS — E1140 Type 2 diabetes mellitus with diabetic neuropathy, unspecified: Secondary | ICD-10-CM

## 2022-04-14 NOTE — Chronic Care Management (AMB) (Signed)
Chronic Care Management   CCM RN Visit Note  04/14/2022 Name: Teresa Shields MRN: [id number] DOB: [birth year]  Subjective: Teresa Shields is a 86 y.o. year old female who is a primary care patient of [name], Kirstie Peri, MD. The care management team was consulted for assistance with disease management and care coordination needs.    Engaged with patient by telephone for follow up visit in response to provider referral for case management and care coordination services.   Consent to Services:  The patient was given information about Chronic Care Management services, agreed to services, and gave verbal consent prior to initiation of services.  Please see initial visit note for detailed documentation.   Assessment: Review of patient past medical history, allergies, medications, health status, including review of consultants reports, laboratory and other test data, was performed as part of comprehensive evaluation and provision of chronic care management services.   SDOH (Social Determinants of Health) assessments and interventions performed: No  CCM Care Plan  Allergies  Allergen Reactions   Metformin And Related Diarrhea and Nausea Only   Naproxen Diarrhea    Outpatient Encounter Medications as of 04/14/2022  Medication Sig Note   acetaminophen (TYLENOL) 500 MG tablet Take 1,000 mg by mouth 2 (two) times daily as needed (for pain.). 04/07/2022: Reports dose change. Currently taking Tylenol Arthritis 650 mg once a day if needed.   allopurinol (ZYLOPRIM) 300 MG tablet TAKE 1 TABLET EVERY DAY    Ascorbic Acid (VITAMIN C) 1000 MG tablet Take 1,000 mg by mouth daily.    aspirin EC 81 MG tablet Take 81 mg by mouth daily.    atorvastatin (LIPITOR) 40 MG tablet Take 1 tablet (40 mg total) by mouth every evening.    calcium-vitamin D (OSCAL WITH D) 500-200 MG-UNIT tablet Take 1 tablet by mouth daily with breakfast.     colchicine 0.6 MG tablet 2 tablets at first sign of gout, then one daily as  needed    glipiZIDE (GLUCOTROL XL) 2.5 MG 24 hr tablet Take 1 tablet (2.5 mg total) by mouth daily with breakfast.    HYDROcodone-acetaminophen (NORCO/VICODIN) 5-325 MG tablet Take 1-2 tablets by mouth every 4 (four) hours as needed for moderate pain.    indomethacin (INDOCIN) 25 MG capsule Take 1-2 capsules (25-50 mg total) by mouth 2 (two) times daily with a meal. For gout    losartan (COZAAR) 50 MG tablet Take 1 tablet (50 mg total) by mouth daily.    metoprolol succinate (TOPROL-XL) 50 MG 24 hr tablet TAKE 1 TABLET EVERY DAY FOR HIGH BLOOD PRESSURE    Multiple Vitamin (MULTIVITAMIN WITH MINERALS) TABS tablet Take 1 tablet by mouth daily.    Omega-3 Fatty Acids (FISH OIL) 1000 MG CAPS Take 1,000 mg by mouth daily.    pregabalin (LYRICA) 75 MG capsule Take 1 capsule (75 mg total) by mouth 3 (three) times daily.    triamterene-hydrochlorothiazide (MAXZIDE) 75-50 MG tablet TAKE 1 TABLET EVERY DAY    Vitamins/Minerals TABS Take 1 tablet by mouth daily.     No facility-administered encounter medications on file as of 04/14/2022.    Patient Active Problem List   Diagnosis Date Noted   CKD (chronic kidney disease) stage 3, GFR 30-59 ml/min (HCC) 03/22/2020   Ankle fracture 05/08/2018   Bimalleolar ankle fracture 04/11/2018      03/08/2018   Diabetes mellitus with nephropathy ([place]) 01/17/2018   Arthralgia 07/04/2016   Edema 07/04/2016   Osteopenia 06/27/2015   Type 2 diabetes  mellitus with diabetic neuropathy, without long-term current use of insulin ([place]) 06/18/2015   Hypokalemia 06/18/2015   Lumbago 06/18/2015   Neuralgia 06/18/2015   Allergic rhinitis 03/18/2010   Hypercalcemia 09/22/2009   Gout 01/15/2008   Hyperlipidemia, mixed 01/15/2008   Hypertension, essential, benign 01/15/2008   Malignant neoplasm of skin of leg 01/15/2008    Patient Care Plan: RN Care Management Plan of Care     Problem Identified: DM, HLD, HTN      Long-Range Goal: Disease Progression Prevented or  Minimized   Start Date: 04/07/2022  Expected End Date: 07/06/2022  Priority: High  Note:   Current Barriers:  Chronic Disease Management support and education needs related to HTN, HLD, and DMII.  RNCM Clinical Goal(s):  Patient will demonstrate improved adherence to prescribed treatment plan for HTN, HLD, and DMII through collaboration with the provider, [place], and the care team.   Interventions: 1:1 collaboration with primary care provider regarding development and update of comprehensive plan of care as evidenced by provider attestation and co-signature Inter-disciplinary care team collaboration (see longitudinal plan of care) Evaluation of current treatment plan related to  self management and patient's adherence to plan as established by provider   Diabetes Interventions: (No Interventions during this outreach) Assessed patient's understanding of A1c goal: <7% Reviewed plan for diabetes management. Reports excellent compliance with medications. She does not monitor blood glucose readings at home.  Reviewed s/sx of hypoglycemia and hyperglycemia along with recommended interventions. Reviewed nutritional intake and discussed importance of complying with a diabetic/ADA diet. Reports good intake. Reports monitoring intake of carbs and added sugars. Reports daughter remains available to assist with meals. Discussed recommended diabetic appointments. Reports completing foot care as recommended. Eye exam scheduled for 06/16/22.  Hyperlipidemia Interventions: (No interventions during this outreach) Discussed established cholesterol goals  Discussed importance of regular laboratory monitoring as prescribed Reviewed importance of limiting foods high in cholesterol. Advised to continue monitoring nutrition labels. Advised to avoid highly processed foods when possible. Discussed activity goals. Reports activity remains limited due to mobility. She is scheduled for Physical Therapy with  [facility] next week.    Hypertension Interventions: ( No Interventions during this outreach) Reviewed plan for hypertension management. Reports taking medications as prescribed. Indicated a preference for losartan for BP management. Reviewed established blood pressure parameters along with indications for notifying a provider. Advised to monitor and record readings.  Reviewed nutritional intake and importance of complying with a cardiac prudent/heart healthy diet. Encouraged to read nutrition labels, monitor sodium intake, and avoid highly processed foods when possible. Reviewed s/sx of heart attack, stroke and worsening symptoms that require immediate medical attention.  Fall Risk and Care Coordination r/t in home therapy (New Goal) Follow up care coordination related to plan for physical therapy and occupational therapy. Confirmed that [place] [facility] anticipates start of services on May 8th. Plan is currently to complete therapy twice a week. Discussed safety precautions and fall prevention measures. Patient reports utilizing a walker as advised.  Reviewed ability to perform tasks in the home. She remains independent with ADLs. Reports her daughter is readily available to assist with small tasks in the home and errands as needed. She does not anticipate need for additional in-home services. Agreed to update the team if her functional status changes or declines.  Patient Goals/Self-Care Activities: Take all medications as prescribed Attend all scheduled provider appointments Call pharmacy for medication refills 3-7 days in advance of running out of medications Perform all self care activities independently  Call provider office for new concerns or questions    Follow Up Plan:   Will follow up next month.       PLAN: A member of the care management team will follow up next month.   [facility] Care Management [facility] [phone]

## 2022-04-14 NOTE — Patient Instructions (Signed)
Thank you for allowing the Chronic Care Management team to participate in your care. It was great speaking with you today! °

## 2022-04-17 ENCOUNTER — Telehealth: Payer: Self-pay

## 2022-04-17 NOTE — Telephone Encounter (Signed)
That's fine

## 2022-04-17 NOTE — Telephone Encounter (Signed)
Copied from [place] [id number]. Topic: Quick Communication - Home Health Verbal Orders ?>> Apr 17, 2022 12:03 PM McGill, Nelva Bush wrote: ?Caller/Agency: [facility] ?Callback Number: [phone] ?Requesting OT/PT/Skilled Nursing/Social Work/Speech Therapy: PT,OT for neuropathy on hands (evaluation)  ?Frequency: 2w3,1w5 ?

## 2022-04-17 NOTE — Telephone Encounter (Signed)
As per provider, verbal orders given to [facility] with [facility] ?

## 2022-04-24 DIAGNOSIS — M1612 Unilateral primary osteoarthritis, left hip: Secondary | ICD-10-CM | POA: Diagnosis not present

## 2022-04-24 DIAGNOSIS — E1140 Type 2 diabetes mellitus with diabetic neuropathy, unspecified: Secondary | ICD-10-CM | POA: Diagnosis not present

## 2022-04-24 DIAGNOSIS — E876 Hypokalemia: Secondary | ICD-10-CM

## 2022-04-24 DIAGNOSIS — I129 Hypertensive chronic kidney disease with stage 1 through stage 4 chronic kidney disease, or unspecified chronic kidney disease: Secondary | ICD-10-CM

## 2022-04-24 DIAGNOSIS — J309 Allergic rhinitis, unspecified: Secondary | ICD-10-CM

## 2022-04-24 DIAGNOSIS — E782 Mixed hyperlipidemia: Secondary | ICD-10-CM | POA: Diagnosis not present

## 2022-04-24 DIAGNOSIS — E7800 Pure hypercholesterolemia, unspecified: Secondary | ICD-10-CM

## 2022-04-24 DIAGNOSIS — M109 Gout, unspecified: Secondary | ICD-10-CM | POA: Diagnosis not present

## 2022-04-24 DIAGNOSIS — E1122 Type 2 diabetes mellitus with diabetic chronic kidney disease: Secondary | ICD-10-CM

## 2022-04-24 DIAGNOSIS — N183 Chronic kidney disease, stage 3 unspecified: Secondary | ICD-10-CM

## 2022-04-25 ENCOUNTER — Telehealth: Payer: Self-pay | Admitting: Family Medicine

## 2022-04-25 NOTE — Telephone Encounter (Signed)
Home Health Verbal Orders - Caller/Agency: Marlowe Kays / [place]  ?Callback Number: [id number] vm can be left  ?Requesting OT ?Frequency: 2x's a week for 1 week  ?And 1 x a week for 6 weeks  ?

## 2022-04-25 NOTE — Telephone Encounter (Signed)
That's fine

## 2022-04-26 ENCOUNTER — Encounter: Payer: Self-pay | Admitting: Family Medicine

## 2022-04-26 ENCOUNTER — Ambulatory Visit (INDEPENDENT_AMBULATORY_CARE_PROVIDER_SITE_OTHER): Payer: Medicare Other | Admitting: Family Medicine

## 2022-04-26 VITALS — BP 118/51 | HR 65 | Temp 98.6°F | Resp 14 | Wt 168.2 lb

## 2022-04-26 DIAGNOSIS — E1140 Type 2 diabetes mellitus with diabetic neuropathy, unspecified: Secondary | ICD-10-CM | POA: Diagnosis not present

## 2022-04-26 DIAGNOSIS — E559 Vitamin D deficiency, unspecified: Secondary | ICD-10-CM

## 2022-04-26 NOTE — Telephone Encounter (Signed)
Advised 

## 2022-04-26 NOTE — Progress Notes (Signed)
?  ? ?I,Jana Robinson,acting as a scribe for Lelon Huh, MD.,have documented all relevant documentation on the behalf of Lelon Huh, MD,as directed by  Lelon Huh, MD while in the presence of Lelon Huh, MD. ? ? ?Established patient visit ? ? ?Patient: Teresa Shields   DOB: 1936-09-07   86 y.o. Female  MRN: [id number] ?Visit Date: 04/26/2022 ? ?Today's healthcare provider: Lelon Huh, MD  ? ?Chief Complaint  ?Patient presents with  ? Diabetes  ? ?Subjective  ?  ?Diabetes Mellitus Type II, Follow-up ? ?Lab Results  ?Component Value Date  ? HGBA1C 8.6 (H) 04/03/2022  ? HGBA1C 7.2 (A) 11/01/2021  ? HGBA1C 7.4 (A) 06/24/2021  ? ?Wt Readings from Last 3 Encounters:  ?04/26/22 168 lb 3.2 oz (76.3 kg)  ?04/03/22 170 lb (77.1 kg)  ?11/01/21 166 lb 4.8 oz (75.4 kg)  ? ?Last seen for diabetes 1 months ago.  ?Management since then includes start back on Glipizide. ?She reports excellent compliance with treatment. ?She is not having side effects.  ?Symptoms: ?No fatigue No foot ulcerations  ?No appetite changes No nausea  ?No paresthesia of the feet  No polydipsia  ?No polyuria No visual disturbances   ?No vomiting   ? ? ?Home blood sugar records: does not check  ? ?Episodes of hypoglycemia? No  ?  ?Current insulin regiment: none  ?Most Recent Eye Exam: next appt July 2, '23 ?Current exercise: walks daily and PT ?Current diet habits: in general, a "healthy" diet   ? ?Pertinent Labs: ?Lab Results  ?Component Value Date  ? CHOL 175 04/03/2022  ? HDL 36 (L) 04/03/2022  ? Coyle 67 04/03/2022  ? TRIG 464 (H) 04/03/2022  ? CHOLHDL 4.9 (H) 04/03/2022  ? Lab Results  ?Component Value Date  ? NA 142 04/03/2022  ? K 4.6 04/03/2022  ? CREATININE 1.21 (H) 04/03/2022  ? EGFR 44 (L) 04/03/2022  ? MICROALBUR 50 03/22/2020  ? LABMICR 208.6 04/03/2022  ?  ? ?---------------------------------------------------------------------------------------------------  ?Vitamin D deficiency, follow-up ? ?Lab Results  ?Component Value Date   ? VD25OH 32.0 04/03/2022  ? VD25OH 34.2 09/27/2020  ? VD25OH 37.0 01/21/2018  ? CALCIUM 9.9 04/03/2022  ? CALCIUM 9.8 03/14/2021  ? PTH 46 03/14/2021  ? PTH 43 09/27/2020  ? PTH Comment 09/27/2020  ? ?     ?Wt Readings from Last 3 Encounters:  ?04/26/22 168 lb 3.2 oz (76.3 kg)  ?04/03/22 170 lb (77.1 kg)  ?11/01/21 166 lb 4.8 oz (75.4 kg)  ? ? ?She was last seen for vitamin D deficiency 1 months ago.  ?Management since that visit includes start Vitamin D3 2,000 units once daily in addition to calcium/vitamin D supplement.  ?She reports excellent compliance with treatment. ?She is not having side effects. ? ?Symptoms: ?No change in energy level No numbness or tingling  ?No bone pain No unexplained fracture  ? ?---------------------------------------------------------------------------------------------------  ?Medications: ?Outpatient Medications Prior to Visit  ?Medication Sig  ? acetaminophen (TYLENOL) 500 MG tablet Take 1,000 mg by mouth 2 (two) times daily as needed (for pain.).  ? allopurinol (ZYLOPRIM) 300 MG tablet TAKE 1 TABLET EVERY DAY  ? Ascorbic Acid (VITAMIN C) 1000 MG tablet Take 1,000 mg by mouth daily.  ? aspirin EC 81 MG tablet Take 81 mg by mouth daily.  ? atorvastatin (LIPITOR) 40 MG tablet Take 1 tablet (40 mg total) by mouth every evening.  ? calcium-vitamin D (OSCAL WITH D) 500-200 MG-UNIT tablet Take 1 tablet by mouth daily with  breakfast.   ? colchicine 0.6 MG tablet 2 tablets at first sign of gout, then one daily as needed  ? glipiZIDE (GLUCOTROL XL) 2.5 MG 24 hr tablet Take 1 tablet (2.5 mg total) by mouth daily with breakfast.  ? indomethacin (INDOCIN) 25 MG capsule Take 1-2 capsules (25-50 mg total) by mouth 2 (two) times daily with a meal. For gout  ? losartan (COZAAR) 50 MG tablet Take 1 tablet (50 mg total) by mouth daily.  ? metoprolol succinate (TOPROL-XL) 50 MG 24 hr tablet TAKE 1 TABLET EVERY DAY FOR HIGH BLOOD PRESSURE  ? Multiple Vitamin (MULTIVITAMIN WITH MINERALS) TABS tablet  Take 1 tablet by mouth daily.  ? Omega-3 Fatty Acids (FISH OIL) 1000 MG CAPS Take 1,000 mg by mouth daily.  ? pregabalin (LYRICA) 75 MG capsule Take 1 capsule (75 mg total) by mouth 3 (three) times daily.  ? triamterene-hydrochlorothiazide (MAXZIDE) 75-50 MG tablet TAKE 1 TABLET EVERY DAY  ? Vitamins/Minerals TABS Take 1 tablet by mouth daily.   ? HYDROcodone-acetaminophen (NORCO/VICODIN) 5-325 MG tablet Take 1-2 tablets by mouth every 4 (four) hours as needed for moderate pain. (Patient not taking: Reported on 04/26/2022)  ? ?No facility-administered medications prior to visit.  ? ? ? ? ? ?  Objective  ?  ?BP (!) 118/51 (BP Location: Right Arm, Patient Position: Sitting, Cuff Size: Normal)   Pulse 65   Temp 98.6 ?F (37 ?C) (Oral)   Resp 14   Wt 168 lb 3.2 oz (76.3 kg)   SpO2 99%   BMI 27.15 kg/m?  ? ? ? ? Assessment & Plan  ?  ? ?1. Type 2 diabetes mellitus with diabetic neuropathy, without long-term current use of insulin ([place]) ?Doing well since starting back on glipizide 2.5 last month. Is fasting today.  ?- Hemoglobin A1c ?- Glucose ? ?2. Vitamin D deficiency ?Doing well starting on additional vitamin d supplement last month.  ?- VITAMIN D 25 Hydroxy (Vit-D Deficiency, Fractures)  ?  ?   ? ?The entirety of the information documented in the History of Present Illness, Review of Systems and Physical Exam were personally obtained by me. Portions of this information were initially documented by the CMA and reviewed by me for thoroughness and accuracy.   ? ? ?Lelon Huh, MD  ?[facility] ?[phone] (phone) ?[phone] (fax) ? ?[facility] ?

## 2022-04-27 LAB — HEMOGLOBIN A1C
Est. average glucose Bld gHb Est-mCnc: 186 mg/dL
Hgb A1c MFr Bld: 8.1 % — ABNORMAL HIGH (ref 4.8–5.6)

## 2022-04-27 LAB — VITAMIN D 25 HYDROXY (VIT D DEFICIENCY, FRACTURES): Vit D, 25-Hydroxy: 39.5 ng/mL (ref 30.0–100.0)

## 2022-04-27 LAB — GLUCOSE, RANDOM: Glucose: 157 mg/dL — ABNORMAL HIGH (ref 70–99)

## 2022-05-09 ENCOUNTER — Telehealth: Payer: Self-pay

## 2022-05-09 NOTE — Telephone Encounter (Signed)
That's fine

## 2022-05-09 NOTE — Telephone Encounter (Signed)
Copied from CRM [id number]. Topic: Quick Communication - Home Health Verbal Orders >> May 09, 2022  4:27 PM Gaetana Michaelis A wrote: Caller/Agency: Skip Mayer Number: [phone] Requesting OT/PT/Skilled Nursing/Social Work/Speech Therapy: PT Frequency: 1w1 2w4 1w3

## 2022-05-10 DIAGNOSIS — I10 Essential (primary) hypertension: Secondary | ICD-10-CM

## 2022-05-10 DIAGNOSIS — E785 Hyperlipidemia, unspecified: Secondary | ICD-10-CM

## 2022-05-10 DIAGNOSIS — Z7984 Long term (current) use of oral hypoglycemic drugs: Secondary | ICD-10-CM

## 2022-05-10 DIAGNOSIS — E1159 Type 2 diabetes mellitus with other circulatory complications: Secondary | ICD-10-CM | POA: Diagnosis not present

## 2022-05-10 NOTE — Telephone Encounter (Signed)
Verbal okay was given. 

## 2022-05-19 ENCOUNTER — Other Ambulatory Visit: Payer: Self-pay | Admitting: Family Medicine

## 2022-05-19 DIAGNOSIS — I10 Essential (primary) hypertension: Secondary | ICD-10-CM

## 2022-05-19 MED ORDER — LOSARTAN POTASSIUM 50 MG PO TABS: 50.0000 mg | ORAL_TABLET | Freq: Every day | ORAL | 4 refills | Status: DC

## 2022-06-10 DIAGNOSIS — M1612 Unilateral primary osteoarthritis, left hip: Secondary | ICD-10-CM | POA: Diagnosis not present

## 2022-06-10 DIAGNOSIS — Z85828 Personal history of other malignant neoplasm of skin: Secondary | ICD-10-CM | POA: Diagnosis not present

## 2022-06-10 DIAGNOSIS — Z7984 Long term (current) use of oral hypoglycemic drugs: Secondary | ICD-10-CM | POA: Diagnosis not present

## 2022-06-10 DIAGNOSIS — N183 Chronic kidney disease, stage 3 unspecified: Secondary | ICD-10-CM | POA: Diagnosis not present

## 2022-06-10 DIAGNOSIS — I129 Hypertensive chronic kidney disease with stage 1 through stage 4 chronic kidney disease, or unspecified chronic kidney disease: Secondary | ICD-10-CM | POA: Diagnosis not present

## 2022-06-10 DIAGNOSIS — E1140 Type 2 diabetes mellitus with diabetic neuropathy, unspecified: Secondary | ICD-10-CM | POA: Diagnosis not present

## 2022-06-10 DIAGNOSIS — M109 Gout, unspecified: Secondary | ICD-10-CM | POA: Diagnosis not present

## 2022-06-10 DIAGNOSIS — J309 Allergic rhinitis, unspecified: Secondary | ICD-10-CM | POA: Diagnosis not present

## 2022-06-10 DIAGNOSIS — E7800 Pure hypercholesterolemia, unspecified: Secondary | ICD-10-CM | POA: Diagnosis not present

## 2022-06-10 DIAGNOSIS — E876 Hypokalemia: Secondary | ICD-10-CM | POA: Diagnosis not present

## 2022-06-10 DIAGNOSIS — E782 Mixed hyperlipidemia: Secondary | ICD-10-CM | POA: Diagnosis not present

## 2022-06-10 DIAGNOSIS — E1122 Type 2 diabetes mellitus with diabetic chronic kidney disease: Secondary | ICD-10-CM | POA: Diagnosis not present

## 2022-06-10 DIAGNOSIS — M8580 Other specified disorders of bone density and structure, unspecified site: Secondary | ICD-10-CM | POA: Diagnosis not present

## 2022-06-10 DIAGNOSIS — R32 Unspecified urinary incontinence: Secondary | ICD-10-CM | POA: Diagnosis not present

## 2022-06-10 DIAGNOSIS — F4321 Adjustment disorder with depressed mood: Secondary | ICD-10-CM | POA: Diagnosis not present

## 2022-06-16 DIAGNOSIS — E7800 Pure hypercholesterolemia, unspecified: Secondary | ICD-10-CM

## 2022-06-16 DIAGNOSIS — E1122 Type 2 diabetes mellitus with diabetic chronic kidney disease: Secondary | ICD-10-CM

## 2022-06-16 DIAGNOSIS — M1612 Unilateral primary osteoarthritis, left hip: Secondary | ICD-10-CM | POA: Diagnosis not present

## 2022-06-16 DIAGNOSIS — E876 Hypokalemia: Secondary | ICD-10-CM

## 2022-06-16 DIAGNOSIS — E1140 Type 2 diabetes mellitus with diabetic neuropathy, unspecified: Secondary | ICD-10-CM | POA: Diagnosis not present

## 2022-06-16 DIAGNOSIS — E119 Type 2 diabetes mellitus without complications: Secondary | ICD-10-CM | POA: Diagnosis not present

## 2022-06-16 DIAGNOSIS — M109 Gout, unspecified: Secondary | ICD-10-CM | POA: Diagnosis not present

## 2022-06-16 DIAGNOSIS — I129 Hypertensive chronic kidney disease with stage 1 through stage 4 chronic kidney disease, or unspecified chronic kidney disease: Secondary | ICD-10-CM

## 2022-06-16 DIAGNOSIS — H35361 Drusen (degenerative) of macula, right eye: Secondary | ICD-10-CM | POA: Diagnosis not present

## 2022-06-16 DIAGNOSIS — E782 Mixed hyperlipidemia: Secondary | ICD-10-CM | POA: Diagnosis not present

## 2022-06-16 DIAGNOSIS — N183 Chronic kidney disease, stage 3 unspecified: Secondary | ICD-10-CM

## 2022-06-16 LAB — HM DIABETES EYE EXAM

## 2022-07-07 ENCOUNTER — Telehealth: Payer: Self-pay | Admitting: Family Medicine

## 2022-07-07 NOTE — Telephone Encounter (Signed)
Home Health Verbal Orders - Caller/Agency: Gigi Gin  Callback Number: [phone] Requesting OT/PT/Skilled Nursing/Social Work/Speech Therapy: OT  Frequency:   Requesting verbals for OT:  1w4

## 2022-07-10 DIAGNOSIS — I129 Hypertensive chronic kidney disease with stage 1 through stage 4 chronic kidney disease, or unspecified chronic kidney disease: Secondary | ICD-10-CM | POA: Diagnosis not present

## 2022-07-10 DIAGNOSIS — M8580 Other specified disorders of bone density and structure, unspecified site: Secondary | ICD-10-CM | POA: Diagnosis not present

## 2022-07-10 DIAGNOSIS — N183 Chronic kidney disease, stage 3 unspecified: Secondary | ICD-10-CM | POA: Diagnosis not present

## 2022-07-10 DIAGNOSIS — J309 Allergic rhinitis, unspecified: Secondary | ICD-10-CM | POA: Diagnosis not present

## 2022-07-10 DIAGNOSIS — E876 Hypokalemia: Secondary | ICD-10-CM | POA: Diagnosis not present

## 2022-07-10 DIAGNOSIS — E782 Mixed hyperlipidemia: Secondary | ICD-10-CM | POA: Diagnosis not present

## 2022-07-10 DIAGNOSIS — Z7984 Long term (current) use of oral hypoglycemic drugs: Secondary | ICD-10-CM | POA: Diagnosis not present

## 2022-07-10 DIAGNOSIS — M109 Gout, unspecified: Secondary | ICD-10-CM | POA: Diagnosis not present

## 2022-07-10 DIAGNOSIS — E1122 Type 2 diabetes mellitus with diabetic chronic kidney disease: Secondary | ICD-10-CM | POA: Diagnosis not present

## 2022-07-10 DIAGNOSIS — R32 Unspecified urinary incontinence: Secondary | ICD-10-CM | POA: Diagnosis not present

## 2022-07-10 DIAGNOSIS — F4321 Adjustment disorder with depressed mood: Secondary | ICD-10-CM | POA: Diagnosis not present

## 2022-07-10 DIAGNOSIS — Z85828 Personal history of other malignant neoplasm of skin: Secondary | ICD-10-CM | POA: Diagnosis not present

## 2022-07-10 DIAGNOSIS — E1140 Type 2 diabetes mellitus with diabetic neuropathy, unspecified: Secondary | ICD-10-CM | POA: Diagnosis not present

## 2022-07-10 DIAGNOSIS — E7800 Pure hypercholesterolemia, unspecified: Secondary | ICD-10-CM | POA: Diagnosis not present

## 2022-07-10 DIAGNOSIS — M1612 Unilateral primary osteoarthritis, left hip: Secondary | ICD-10-CM | POA: Diagnosis not present

## 2022-07-10 NOTE — Telephone Encounter (Signed)
Connie given verbal.  

## 2022-07-18 ENCOUNTER — Ambulatory Visit: Payer: Self-pay

## 2022-07-18 NOTE — Chronic Care Management (AMB) (Signed)
   07/18/2022  Teresa Shields [birth year] [id number]   Documentation encounter created to complete case transition. The care management team will continue to follow for care coordination needs.  [facility] Care Management [phone]

## 2022-07-31 ENCOUNTER — Encounter: Payer: Self-pay | Admitting: Family Medicine

## 2022-07-31 ENCOUNTER — Ambulatory Visit (INDEPENDENT_AMBULATORY_CARE_PROVIDER_SITE_OTHER): Payer: Medicare Other | Admitting: Family Medicine

## 2022-07-31 VITALS — BP 124/52 | HR 65 | Temp 98.4°F | Resp 14 | Wt 170.0 lb

## 2022-07-31 DIAGNOSIS — N183 Chronic kidney disease, stage 3 unspecified: Secondary | ICD-10-CM

## 2022-07-31 DIAGNOSIS — E1140 Type 2 diabetes mellitus with diabetic neuropathy, unspecified: Secondary | ICD-10-CM

## 2022-07-31 DIAGNOSIS — I10 Essential (primary) hypertension: Secondary | ICD-10-CM | POA: Diagnosis not present

## 2022-07-31 LAB — POCT GLYCOSYLATED HEMOGLOBIN (HGB A1C)
Est. average glucose Bld gHb Est-mCnc: 134
Hemoglobin A1C: 6.3 % — AB (ref 4.0–5.6)

## 2022-07-31 MED ORDER — LOSARTAN POTASSIUM 50 MG PO TABS: 50.0000 mg | ORAL_TABLET | Freq: Every day | ORAL | 4 refills | Status: DC

## 2022-07-31 NOTE — Progress Notes (Signed)
I,Roshena L Chambers,acting as a scribe for Lelon Huh, MD.,have documented all relevant documentation on the behalf of Lelon Huh, MD,as directed by  Lelon Huh, MD while in the presence of Lelon Huh, MD.   Established patient visit   Patient: Teresa Shields   DOB: 11-16-1936   86 y.o. Female  MRN: [id number] Visit Date: 07/31/2022  Today's healthcare provider: Lelon Huh, MD   Chief Complaint  Patient presents with   Diabetes   Subjective    Diabetes Mellitus Type II, Follow-up  Lab Results  Component Value Date   HGBA1C 6.3 (A) 07/31/2022   HGBA1C 8.1 (H) 04/26/2022   HGBA1C 8.6 (H) 04/03/2022   Wt Readings from Last 3 Encounters:  07/31/22 170 lb (77.1 kg)  04/26/22 168 lb 3.2 oz (76.3 kg)  04/03/22 170 lb (77.1 kg)   Last seen for diabetes 3 months ago.  Management since then includes: Continue current medication. She reports good compliance with treatment. She is not having side effects. Symptoms: No fatigue No foot ulcerations  No appetite changes No nausea  No paresthesia of the feet  No polydipsia  No polyuria No visual disturbances   No vomiting     Home blood sugar records:  does not check blood sugars  Episodes of hypoglycemia? No  Current insulin regiment: none Most Recent Eye Exam: 06/16/22 Current exercise: walking and  Physical Therapy exercise Current diet habits: well balanced  Pertinent Labs: Lab Results  Component Value Date   CHOL 175 04/03/2022   HDL 36 (L) 04/03/2022   LDLCALC 67 04/03/2022   TRIG 464 (H) 04/03/2022   CHOLHDL 4.9 (H) 04/03/2022   Lab Results  Component Value Date   NA 142 04/03/2022   K 4.6 04/03/2022   CREATININE 1.21 (H) 04/03/2022   EGFR 44 (L) 04/03/2022   MICROALBUR 50 03/22/2020   LABMICR 208.6 04/03/2022     ---------------------------------------------------------------------------------------------------   Medications: Outpatient Medications Prior to Visit  Medication Sig    acetaminophen (TYLENOL) 500 MG tablet Take 1,000 mg by mouth 2 (two) times daily as needed (for pain.).   allopurinol (ZYLOPRIM) 300 MG tablet TAKE 1 TABLET EVERY DAY   Ascorbic Acid (VITAMIN C) 1000 MG tablet Take 1,000 mg by mouth daily.   aspirin EC 81 MG tablet Take 81 mg by mouth daily.   atorvastatin (LIPITOR) 40 MG tablet Take 1 tablet (40 mg total) by mouth every evening.   calcium-vitamin D (OSCAL WITH D) 500-200 MG-UNIT tablet Take 1 tablet by mouth daily with breakfast.    glipiZIDE (GLUCOTROL XL) 2.5 MG 24 hr tablet Take 1 tablet (2.5 mg total) by mouth daily with breakfast.   HYDROcodone-acetaminophen (NORCO/VICODIN) 5-325 MG tablet Take 1-2 tablets by mouth every 4 (four) hours as needed for moderate pain.   indomethacin (INDOCIN) 25 MG capsule Take 1-2 capsules (25-50 mg total) by mouth 2 (two) times daily with a meal. For gout   losartan (COZAAR) 50 MG tablet Take 1 tablet (50 mg total) by mouth daily.   metoprolol succinate (TOPROL-XL) 50 MG 24 hr tablet TAKE 1 TABLET EVERY DAY FOR HIGH BLOOD PRESSURE   Multiple Vitamin (MULTIVITAMIN WITH MINERALS) TABS tablet Take 1 tablet by mouth daily.   Omega-3 Fatty Acids (FISH OIL) 1000 MG CAPS Take 1,000 mg by mouth daily.   pregabalin (LYRICA) 75 MG capsule Take 1 capsule (75 mg total) by mouth 3 (three) times daily.   triamterene-hydrochlorothiazide (MAXZIDE) 75-50 MG tablet TAKE 1 TABLET EVERY  DAY   Vitamins/Minerals TABS Take 1 tablet by mouth daily.    colchicine 0.6 MG tablet 2 tablets at first sign of gout, then one daily as needed (Patient not taking: Reported on 07/31/2022)   No facility-administered medications prior to visit.    Review of Systems  Constitutional:  Negative for appetite change, chills, fatigue and fever.  Respiratory:  Negative for chest tightness and shortness of breath.   Cardiovascular:  Negative for chest pain and palpitations.  Gastrointestinal:  Negative for abdominal pain, nausea and vomiting.   Neurological:  Negative for dizziness and weakness.       Objective    BP (!) 124/52 (BP Location: Left Arm, Patient Position: Sitting, Cuff Size: Large)   Pulse 98 Comment: room air  Temp 98.4 F (36.9 C) (Oral)   Resp 14   Wt 170 lb (77.1 kg)   SpO2 (!) 65%   BMI 27.44 kg/m    Physical Exam   General: Appearance:    Well developed, well nourished female in no acute distress  Eyes:    PERRL, conjunctiva/corneas clear, EOM's intact       Lungs:     Clear to auscultation bilaterally, respirations unlabored  Heart:    Normal heart rate. Normal rhythm. No murmurs, rubs, or gallops.    MS:   All extremities are intact.    Neurologic:   Awake, alert, oriented x 3. No apparent focal neurological defect.        Results for orders placed or performed in visit on 07/31/22  POCT glycosylated hemoglobin (Hb A1C)  Result Value Ref Range   Hemoglobin A1C 6.3 (A) 4.0 - 5.6 %   Est. average glucose Bld gHb Est-mCnc 134     Assessment & Plan     1. Type 2 diabetes mellitus with diabetic neuropathy, without long-term current use of insulin ([place]) Much better controlled. Continue current medications.  Continue ARB for renal protection.   2. Hypertension, essential, benign Well controlled. Continue current medications.   - losartan (COZAAR) 50 MG tablet; Take 1 tablet (50 mg total) by mouth daily.  Dispense: 90 tablet; Refill: 4  3. Stage 3 chronic kidney disease, unspecified whether stage 3a or 3b CKD (HCC) Stable. Check renal panel at next follow up. Consider SGLT-1 inhibitor if not improving.    Future Appointments  Date Time Provider [place]  12/01/2022  1:20 PM Caryn Section Kirstie Peri, MD [facility]        The entirety of the information documented in the History of Present Illness, Review of Systems and Physical Exam were personally obtained by me. Portions of this information were initially documented by the CMA and reviewed by me for thoroughness and accuracy.      Lelon Huh, MD  [facility] [phone] (phone) [phone] (fax)  [place]

## 2022-08-28 ENCOUNTER — Other Ambulatory Visit: Payer: Self-pay | Admitting: Family Medicine

## 2022-08-28 DIAGNOSIS — E1140 Type 2 diabetes mellitus with diabetic neuropathy, unspecified: Secondary | ICD-10-CM

## 2022-08-28 NOTE — Telephone Encounter (Signed)
Medication Refill - Medication: pregabalin 75 mg  Has the patient contacted their pharmacy? Yes.   (Agent: If no, request that the patient contact the pharmacy for the refill. If patient does not wish to contact the pharmacy document the reason why and proceed with request.) (Agent: If yes, when and what did the pharmacy advise?)  Preferred Pharmacy (with phone number or street name): [facility] mail order Has the patient been seen for an appointment in the last year OR does the patient have an upcoming appointment? Yes.    Agent: Please be advised that RX refills may take up to 3 business days. We ask that you follow-up with your pharmacy.

## 2022-08-29 MED ORDER — PREGABALIN 75 MG PO CAPS: 75.0000 mg | ORAL_CAPSULE | Freq: Three times a day (TID) | ORAL | 1 refills | Status: DC

## 2022-08-29 NOTE — Telephone Encounter (Signed)
Requested medication (s) are due for refill today: yes  Requested medication (s) are on the active medication list:yes  Last refill:  02/08/22  Future visit scheduled: yes  Notes to clinic:  Unable to refill per protocol, cannot delegate.      Requested Prescriptions  Pending Prescriptions Disp Refills   pregabalin (LYRICA) 75 MG capsule 270 capsule 1    Sig: Take 1 capsule (75 mg total) by mouth 3 (three) times daily.     Not Delegated - Neurology:  Anticonvulsants - Controlled - pregabalin Failed - 08/28/2022 11:18 AM      Failed - This refill cannot be delegated      Failed - Cr in normal range and within 360 days    Creatinine, Ser  Date Value Ref Range Status  04/03/2022 1.21 (H) 0.57 - 1.00 mg/dL Final         Passed - Completed PHQ-2 or PHQ-9 in the last 360 days      Passed - Valid encounter within last 12 months    Recent Outpatient Visits           4 weeks ago Type 2 diabetes mellitus with diabetic neuropathy, without long-term current use of insulin ([place])   [facility] Birdie Sons, MD   4 months ago Type 2 diabetes mellitus with diabetic neuropathy, without long-term current use of insulin ([place])   [facility] Birdie Sons, MD   4 months ago Type 2 diabetes mellitus with diabetic neuropathy, without long-term current use of insulin ([place])   [facility] Birdie Sons, MD   10 months ago Type 2 diabetes mellitus with diabetic neuropathy, without long-term current use of insulin [facility])   [facility] Birdie Sons, MD   1 year ago Type 2 diabetes mellitus with diabetic neuropathy, without long-term current use of insulin [facility])   [facility] Birdie Sons, MD       Future Appointments             In 3 months [name], Kirstie Peri, MD [facility] [place]

## 2022-09-29 ENCOUNTER — Other Ambulatory Visit: Payer: Self-pay | Admitting: Family Medicine

## 2022-09-29 DIAGNOSIS — E1140 Type 2 diabetes mellitus with diabetic neuropathy, unspecified: Secondary | ICD-10-CM

## 2022-09-29 NOTE — Telephone Encounter (Signed)
Requested Prescriptions  Pending Prescriptions Disp Refills  . glipiZIDE (GLUCOTROL XL) 2.5 MG 24 hr tablet [Pharmacy Med Name: GLIPIZIDE ER 2.5MG  TABLETS] 90 tablet 0    Sig: TAKE 1 TABLET(2.5 MG) BY MOUTH DAILY WITH BREAKFAST     Endocrinology:  Diabetes - Sulfonylureas Failed - 09/29/2022 11:17 AM      Failed - Cr in normal range and within 360 days    Creatinine, Ser  Date Value Ref Range Status  04/03/2022 1.21 (H) 0.57 - 1.00 mg/dL Final         Passed - HBA1C is between 0 and 7.9 and within 180 days    Hemoglobin A1C  Date Value Ref Range Status  07/31/2022 6.3 (A) 4.0 - 5.6 % Final   Hgb A1c MFr Bld  Date Value Ref Range Status  04/26/2022 8.1 (H) 4.8 - 5.6 % Final    Comment:             Prediabetes: 5.7 - 6.4          Diabetes: >6.4          Glycemic control for adults with diabetes: <7.0          Passed - Valid encounter within last 6 months    Recent Outpatient Visits          2 months ago Type 2 diabetes mellitus with diabetic neuropathy, without long-term current use of insulin ([place])   [facility] Birdie Sons, MD   5 months ago Type 2 diabetes mellitus with diabetic neuropathy, without long-term current use of insulin ([place])   [facility] Birdie Sons, MD   5 months ago Type 2 diabetes mellitus with diabetic neuropathy, without long-term current use of insulin [facility])   [facility] Birdie Sons, MD   11 months ago Type 2 diabetes mellitus with diabetic neuropathy, without long-term current use of insulin ([place])   [facility] Birdie Sons, MD   1 year ago Type 2 diabetes mellitus with diabetic neuropathy, without long-term current use of insulin [facility])   [place], Kirstie Peri, MD      Future Appointments            In 2 months [name], Kirstie Peri, MD [facility] [place]

## 2022-11-23 ENCOUNTER — Ambulatory Visit (INDEPENDENT_AMBULATORY_CARE_PROVIDER_SITE_OTHER): Payer: Medicare Other

## 2022-11-23 VITALS — Wt 170.0 lb

## 2022-11-23 DIAGNOSIS — Z0000 Encounter for general adult medical examination without abnormal findings: Secondary | ICD-10-CM | POA: Diagnosis not present

## 2022-11-23 NOTE — Progress Notes (Signed)
Virtual Visit via Telephone Note  I connected with  Teresa Shields on 11/23/22 at  1:30 PM EST by telephone and verified that I am speaking with the correct person using two identifiers.  Location: Patient: home Provider: [facility] Persons participating in the virtual visit: [place]   I discussed the limitations, risks, security and privacy concerns of performing an evaluation and management service by telephone and the availability of in person appointments. The patient expressed understanding and agreed to proceed.  Interactive audio and video telecommunications were attempted between this nurse and patient, however failed, due to patient having technical difficulties OR patient did not have access to video capability.  We continued and completed visit with audio only.  Some vital signs may be absent or patient reported.   Dionisio David, LPN  Subjective:   Teresa Shields is a 86 y.o. female who presents for Medicare Annual (Subsequent) preventive examination.  Review of Systems     Cardiac Risk Factors include: advanced age (>68mn, >>70women);diabetes mellitus;hypertension     Objective:    Today's Vitals   11/23/22 1326  PainSc: 5    There is no height or weight on file to calculate BMI.     11/23/2022    1:32 PM 11/22/2021    1:54 PM 03/18/2020    2:44 PM 04/30/2018   11:15 AM 04/11/2018    4:58 PM 04/11/2018    9:00 AM 04/09/2018    2:42 PM  Advanced Directives  Does Patient Have a Medical Advance Directive? _0  Yes Yes  Does patient want to make changes to medical advance directive?    No - Patient declined No - Patient declined No - Patient declined   Would patient like information on creating a medical advance directive? No - Patient declined No - Patient declined No - Patient declined No - Patient declined No - Patient declined No - Patient declined No - Patient declined    Current Medications (verified) Outpatient Encounter  Medications as of 11/23/2022  Medication Sig   acetaminophen (TYLENOL) 500 MG tablet Take 1,000 mg by mouth 2 (two) times daily as needed (for pain.).   allopurinol (ZYLOPRIM) 300 MG tablet TAKE 1 TABLET EVERY DAY   Ascorbic Acid (VITAMIN C) 1000 MG tablet Take 1,000 mg by mouth daily.   aspirin EC 81 MG tablet Take 81 mg by mouth daily.   atorvastatin (LIPITOR) 40 MG tablet Take 1 tablet (40 mg total) by mouth every evening.   calcium-vitamin D (OSCAL WITH D) 500-200 MG-UNIT tablet Take 1 tablet by mouth daily with breakfast.    colchicine 0.6 MG tablet 2 tablets at first sign of gout, then one daily as needed   glipiZIDE (GLUCOTROL XL) 2.5 MG 24 hr tablet TAKE 1 TABLET(2.5 MG) BY MOUTH DAILY WITH BREAKFAST   indomethacin (INDOCIN) 25 MG capsule Take 1-2 capsules (25-50 mg total) by mouth 2 (two) times daily with a meal. For gout   losartan (COZAAR) 50 MG tablet Take 1 tablet (50 mg total) by mouth daily.   metoprolol succinate (TOPROL-XL) 50 MG 24 hr tablet TAKE 1 TABLET EVERY DAY FOR HIGH BLOOD PRESSURE   Multiple Vitamin (MULTIVITAMIN WITH MINERALS) TABS tablet Take 1 tablet by mouth daily.   Omega-3 Fatty Acids (FISH OIL) 1000 MG CAPS Take 1,000 mg by mouth daily.   pregabalin (LYRICA) 75 MG capsule Take 1 capsule (75 mg total) by mouth 3 (three) times daily.   triamterene-hydrochlorothiazide (MAXZIDE)  75-50 MG tablet TAKE 1 TABLET EVERY DAY   HYDROcodone-acetaminophen (NORCO/VICODIN) 5-325 MG tablet Take 1-2 tablets by mouth every 4 (four) hours as needed for moderate pain. (Patient not taking: Reported on 11/23/2022)   [DISCONTINUED] Vitamins/Minerals TABS Take 1 tablet by mouth daily.    No facility-administered encounter medications on file as of 11/23/2022.    Allergies (verified) Metformin and related and Naproxen   History: Past Medical History:  Diagnosis Date   Gout    History of chicken pox    Hypertension    Past Surgical History:  Procedure Laterality Date   Bone  Density Study  01/08/2011   T-1.2, L-Spine, T-1.9 Osteopenia   CARPAL TUNNEL RELEASE Right    CATARACT EXTRACTION, BILATERAL     Left hip skin cancer resection  2010   Dermatology   ORIF ANKLE FRACTURE Right 04/11/2018   Procedure: OPEN REDUCTION INTERNAL FIXATION (ORIF) ANKLE FRACTURE;  Surgeon: Hessie Knows, MD;  Location: [facility] ORS;  Service: Orthopedics;  Laterality: Right;   TUBAL LIGATION     Family History  Problem Relation Age of Onset   AAA (abdominal aortic aneurysm) Sister    Healthy Mother    Healthy Father    Gout Other    Social History   Socioeconomic History   Marital status: Widowed    Spouse name: Not on file   Number of children: 2   Years of education: Not on file   Highest education level: 12th grade  Occupational History   Occupation: retired  Tobacco Use   Smoking status: Never   Smokeless tobacco: Never  Vaping Use   Vaping Use: Never used  Substance and Sexual Activity   Alcohol use: No   Drug use: No   Sexual activity: Not on file  Other Topics Concern   Not on file  Social History Narrative   Not on file   Social Determinants of Health   Financial Resource Strain: Low Risk  (11/23/2022)   Overall Financial Resource Strain (CARDIA)    Difficulty of Paying Living Expenses: Not hard at all  Food Insecurity: No Food Insecurity (11/23/2022)   Hunger Vital Sign    Worried About Running Out of Food in the Last Year: Never true    [place] in the Last Year: Never true  Transportation Needs: No Transportation Needs (11/23/2022)   PRAPARE - Hydrologist (Medical): No    Lack of Transportation (Non-Medical): No  Physical Activity: Sufficiently Active (11/23/2022)   Exercise Vital Sign    Days of Exercise per Week: 5 days    Minutes of Exercise per Session: 30 min  Stress: No Stress Concern Present (11/23/2022)   [place]    Feeling of Stress  : Not at all  Social Connections: Socially Isolated (11/23/2022)   Social Connection and Isolation Panel [NHANES]    Frequency of Communication with Friends and Family: More than three times a week    Frequency of Social Gatherings with Friends and Family: Never    Attends Religious Services: Never    Marine scientist or Organizations: No    Attends Archivist Meetings: Never    Marital Status: Widowed    Tobacco Counseling Counseling given: Not Answered   Clinical Intake:  Pre-visit preparation completed: Yes  Pain : 0-10 Pain Score: 5  Pain Type: Chronic pain Pain Location: Knee     Nutritional Risks: None Diabetes:  Yes CBG done?: No Did pt. bring in CBG monitor from home?: No  How often do you need to have someone help you when you read instructions, pamphlets, or other written materials from your doctor or pharmacy?: 1 - Never  Diabetic?YES Nutrition Risk Assessment:  Has the patient had any N/V/D within the last 2 months?  No  Does the patient have any non-healing wounds?  No  Has the patient had any unintentional weight loss or weight gain?  No   Diabetes:  Is the patient diabetic?  Yes  If diabetic, was a CBG obtained today?  No  Did the patient bring in their glucometer from home?  No  How often do you monitor your CBG's? NEVER.   Financial Strains and Diabetes Management:  Are you having any financial strains with the device, your supplies or your medication? No .  Does the patient want to be seen by Chronic Care Management for management of their diabetes?  No  Would the patient like to be referred to a Nutritionist or for Diabetic Management?  No   Diabetic Exams:  Diabetic Eye Exam: Completed 06/16/22. Marland Kitchen Pt has been advised about the importance in completing this exam.  Diabetic Foot Exam: Completed NO. Pt has been advised about the importance in completing this exam.    Interpreter Needed?: No  Information entered by :: Kirke Shaggy, LPN   Activities of Daily Living    11/23/2022    1:33 PM 04/26/2022    9:40 AM  In your present state of health, do you have any difficulty performing the following activities:  Hearing? 0 0  Vision? 0 0  Difficulty concentrating or making decisions? 0 0  Walking or climbing stairs? 1 1  Dressing or bathing? 0 0  Doing errands, shopping? 1 1  Preparing Food and eating ? N   Using the Toilet? N   In the past six months, have you accidently leaked urine? N   Do you have problems with loss of bowel control? N   Managing your Medications? N   Managing your Finances? N   Housekeeping or managing your Housekeeping? Y     Patient Care Team: Birdie Sons, MD as PCP - General (Family Medicine)  Indicate any recent Medical Services you may have received from other than [facility] providers in the past year (date may be approximate).     Assessment:   This is a routine wellness examination for Teresa Shields.  Hearing/Vision screen Hearing Screening - Comments:: NO AIDS Vision Screening - Comments:: READERS- [facility]  Dietary issues and exercise activities discussed: Current Exercise Habits: Home exercise routine, Type of exercise: walking, Time (Minutes): 30, Frequency (Times/Week): 5, Weekly Exercise (Minutes/Week): 150, Intensity: Mild   Goals Addressed             This Visit's Progress    DIET - EAT MORE FRUITS AND VEGETABLES         Depression Screen    11/23/2022    1:30 PM 04/26/2022    9:39 AM 11/22/2021    1:51 PM 03/14/2021    8:28 AM 05/04/2020    2:06 PM 03/18/2020    2:41 PM 07/26/2018    9:45 AM  PHQ 2/9 Scores  PHQ - 2 Score 0 0 0 0 0 0 0  PHQ- 9 Score 0 1  0   0    Fall Risk    11/23/2022    1:33 PM 04/26/2022    9:39 AM  11/22/2021    1:59 PM 03/18/2021   10:42 AM 03/14/2021    8:28 AM  Fall Risk   Falls in the past year? 0 0 0 0 0  Number falls in past yr: 0 0 0 0 0  Injury with Fall? 0 0 0  0  Risk for fall due to : No Fall Risks;Impaired  balance/gait  Impaired mobility Impaired balance/gait;Medication side effect   Follow up Falls prevention discussed;Falls evaluation completed Falls evaluation completed Falls prevention discussed Falls prevention discussed     FALL RISK PREVENTION PERTAINING TO THE HOME:  Any stairs in or around the home? No  If so, are there any without handrails? No  Home free of loose throw rugs in walkways, pet beds, electrical cords, etc? Yes  Adequate lighting in your home to reduce risk of falls? Yes   ASSISTIVE DEVICES UTILIZED TO PREVENT FALLS:  Life alert? No  Use of a cane, walker or w/c? Yes  Grab bars in the bathroom? Yes  Shower chair or bench in shower? Yes  Elevated toilet seat or a handicapped toilet? Yes    Cognitive Function:        11/23/2022    1:37 PM 01/05/2017   10:22 AM  6CIT Screen  What Year? 0 points 0 points  What month? 0 points 0 points  What time? 0 points 0 points  Count back from 20 0 points 0 points  Months in reverse 0 points 4 points  Repeat phrase 4 points 2 points  Total Score 4 points 6 points    Immunizations Immunization History  Administered Date(s) Administered   Fluad Quad(high Dose 65+) 09/17/2019, 09/27/2020, 11/01/2021   Influenza, High Dose Seasonal PF 09/30/2015, 09/19/2016, 09/11/2017, 09/24/2018   PFIZER(Purple Top)SARS-COV-2 Vaccination 01/23/2020, 02/17/2020   Pneumococcal Conjugate-13 09/02/2014   Pneumococcal Polysaccharide-23 01/05/2017   Tdap 08/28/2012   Zoster, Live 08/28/2012    TDAP status: Due, Education has been provided regarding the importance of this vaccine. Advised may receive this vaccine at local pharmacy or Health Dept. Aware to provide a copy of the vaccination record if obtained from local pharmacy or Health Dept. Verbalized acceptance and understanding.  Flu Vaccine status: Up to date  Pneumococcal vaccine status: Up to date  Covid-19 vaccine status: Completed vaccines  Qualifies for Shingles Vaccine?  Yes   Zostavax completed Yes   Shingrix Completed?: No.    Education has been provided regarding the importance of this vaccine. Patient has been advised to call insurance company to determine out of pocket expense if they have not yet received this vaccine. Advised may also receive vaccine at local pharmacy or Health Dept. Verbalized acceptance and understanding.  Screening Tests Health Maintenance  Topic Date Due   FOOT EXAM  Never done   Zoster Vaccines- Shingrix (1 of 2) Never done   COVID-19 Vaccine (3 - Pfizer risk series) 03/16/2020   INFLUENZA VACCINE  07/11/2022   DTaP/Tdap/Td (2 - Td or Tdap) 08/28/2022   DEXA SCAN  10/06/2022   HEMOGLOBIN A1C  01/31/2023   Diabetic kidney evaluation - eGFR measurement  04/04/2023   Diabetic kidney evaluation - Urine ACR  04/04/2023   OPHTHALMOLOGY EXAM  06/17/2023   Medicare Annual Wellness (AWV)  11/24/2023   Pneumonia Vaccine 30+ Years old  Completed   HPV VACCINES  Aged Out    Health Maintenance  Health Maintenance Due  Topic Date Due   FOOT EXAM  Never done   Zoster Vaccines- Shingrix (1 of 2)  Never done   COVID-19 Vaccine (3 - Pfizer risk series) 03/16/2020   INFLUENZA VACCINE  07/11/2022   DTaP/Tdap/Td (2 - Td or Tdap) 08/28/2022   DEXA SCAN  10/06/2022    Colorectal cancer screening: No longer required.   Mammogram status: No longer required due to AGE.   Lung Cancer Screening: (Low Dose CT Chest recommended if Age 74-80 years, 30 pack-year currently smoking OR have quit w/in 15years.) does not qualify.    Additional Screening:  Hepatitis C Screening: does not qualify; Completed NO  Vision Screening: Recommended annual ophthalmology exams for early detection of glaucoma and other disorders of the eye. Is the patient up to date with their annual eye exam?  Yes  Who is the provider or what is the name of the office in which the patient attends annual eye exams? [facility] If pt is not established with a provider,  would they like to be referred to a provider to establish care? No .   Dental Screening: Recommended annual dental exams for proper oral hygiene  Community Resource Referral / Chronic Care Management: CRR required this visit?  No   CCM required this visit?  No      Plan:     I have personally reviewed and noted the following in the patient's chart:   Medical and social history Use of alcohol, tobacco or illicit drugs  Current medications and supplements including opioid prescriptions. Patient is not currently taking opioid prescriptions. Functional ability and status Nutritional status Physical activity Advanced directives List of other physicians Hospitalizations, surgeries, and ER visits in previous 12 months Vitals Screenings to include cognitive, depression, and falls Referrals and appointments  In addition, I have reviewed and discussed with patient certain preventive protocols, quality metrics, and best practice recommendations. A written personalized care plan for preventive services as well as general preventive health recommendations were provided to patient.     Dionisio David, LPN   [id number]/[id number]   Nurse Notes: Teresa Shields

## 2022-11-23 NOTE — Patient Instructions (Signed)
Teresa Shields , Thank you for taking time to come for your Medicare Wellness Visit. I appreciate your ongoing commitment to your health goals. Please review the following plan we discussed and let me know if I can assist you in the future.   Screening recommendations/referrals: Colonoscopy: AGED OUT Mammogram: AGED OUT Bone Density: AGED OUT Recommended yearly ophthalmology/optometry visit for glaucoma screening and checkup Recommended yearly dental visit for hygiene and checkup  Vaccinations: Influenza vaccine: STATES HAD THIS SEASON Pneumococcal vaccine: 01/05/17 Tdap vaccine: 08/28/12 Shingles vaccine: Zostavax 08/28/12   Covid-19:01/23/20, 02/17/20  Advanced directives: no  Conditions/risks identified: none  Next appointment: Follow up in one year for your annual wellness visit 11/28/23 @ 11:15 am by phone   Preventive Care 65 Years and Older, Female Preventive care refers to lifestyle choices and visits with your health care provider that can promote health and wellness. What does preventive care include? A yearly physical exam. This is also called an annual well check. Dental exams once or twice a year. Routine eye exams. Ask your health care provider how often you should have your eyes checked. Personal lifestyle choices, including: Daily care of your teeth and gums. Regular physical activity. Eating a healthy diet. Avoiding tobacco and drug use. Limiting alcohol use. Practicing safe sex. Taking low-dose aspirin every day. Taking vitamin and mineral supplements as recommended by your health care provider. What happens during an annual well check? The services and screenings done by your health care provider during your annual well check will depend on your age, overall health, lifestyle risk factors, and family history of disease. Counseling  Your health care provider may ask you questions about your: Alcohol use. Tobacco use. Drug use. Emotional well-being. Home and  relationship well-being. Sexual activity. Eating habits. History of falls. Memory and ability to understand (cognition). Work and work Astronomer. Reproductive health. Screening  You may have the following tests or measurements: Height, weight, and BMI. Blood pressure. Lipid and cholesterol levels. These may be checked every 5 years, or more frequently if you are over 3 years old. Skin check. Lung cancer screening. You may have this screening every year starting at age 51 if you have a 30-pack-year history of smoking and currently smoke or have quit within the past 15 years. Fecal occult blood test (FOBT) of the stool. You may have this test every year starting at age 36. Flexible sigmoidoscopy or colonoscopy. You may have a sigmoidoscopy every 5 years or a colonoscopy every 10 years starting at age 37. Hepatitis C blood test. Hepatitis B blood test. Sexually transmitted disease (STD) testing. Diabetes screening. This is done by checking your blood sugar (glucose) after you have not eaten for a while (fasting). You may have this done every 1-3 years. Bone density scan. This is done to screen for osteoporosis. You may have this done starting at age 59. Mammogram. This may be done every 1-2 years. Talk to your health care provider about how often you should have regular mammograms. Talk with your health care provider about your test results, treatment options, and if necessary, the need for more tests. Vaccines  Your health care provider may recommend certain vaccines, such as: Influenza vaccine. This is recommended every year. Tetanus, diphtheria, and acellular pertussis (Tdap, Td) vaccine. You may need a Td booster every 10 years. Zoster vaccine. You may need this after age 75. Pneumococcal 13-valent conjugate (PCV13) vaccine. One dose is recommended after age 41. Pneumococcal polysaccharide (PPSV23) vaccine. One dose is recommended after age  17. Talk to your health care provider  about which screenings and vaccines you need and how often you need them. This information is not intended to replace advice given to you by your health care provider. Make sure you discuss any questions you have with your health care provider. Document Released: 12/24/2015 Document Revised: 08/16/2016 Document Reviewed: 09/28/2015 Elsevier Interactive Patient Education  2017 [place] Prevention in the Home Falls can cause injuries. They can happen to people of all ages. There are many things you can do to make your home safe and to help prevent falls. What can I do on the outside of my home? Regularly fix the edges of walkways and driveways and fix any cracks. Remove anything that might make you trip as you walk through a door, such as a raised step or threshold. Trim any bushes or trees on the path to your home. Use bright outdoor lighting. Clear any walking paths of anything that might make someone trip, such as rocks or tools. Regularly check to see if handrails are loose or broken. Make sure that both sides of any steps have handrails. Any raised decks and porches should have guardrails on the edges. Have any leaves, snow, or ice cleared regularly. Use sand or salt on walking paths during winter. Clean up any spills in your garage right away. This includes oil or grease spills. What can I do in the bathroom? Use night lights. Install grab bars by the toilet and in the tub and shower. Do not use towel bars as grab bars. Use non-skid mats or decals in the tub or shower. If you need to sit down in the shower, use a plastic, non-slip stool. Keep the floor dry. Clean up any water that spills on the floor as soon as it happens. Remove soap buildup in the tub or shower regularly. Attach bath mats securely with double-sided non-slip rug tape. Do not have throw rugs and other things on the floor that can make you trip. What can I do in the bedroom? Use night lights. Make sure  that you have a light by your bed that is easy to reach. Do not use any sheets or blankets that are too big for your bed. They should not hang down onto the floor. Have a firm chair that has side arms. You can use this for support while you get dressed. Do not have throw rugs and other things on the floor that can make you trip. What can I do in the kitchen? Clean up any spills right away. Avoid walking on wet floors. Keep items that you use a lot in easy-to-reach places. If you need to reach something above you, use a strong step stool that has a grab bar. Keep electrical cords out of the way. Do not use floor polish or wax that makes floors slippery. If you must use wax, use non-skid floor wax. Do not have throw rugs and other things on the floor that can make you trip. What can I do with my stairs? Do not leave any items on the stairs. Make sure that there are handrails on both sides of the stairs and use them. Fix handrails that are broken or loose. Make sure that handrails are as long as the stairways. Check any carpeting to make sure that it is firmly attached to the stairs. Fix any carpet that is loose or worn. Avoid having throw rugs at the top or bottom of the stairs. If you do have  throw rugs, attach them to the floor with carpet tape. Make sure that you have a light switch at the top of the stairs and the bottom of the stairs. If you do not have them, ask someone to add them for you. What else can I do to help prevent falls? Wear shoes that: Do not have high heels. Have rubber bottoms. Are comfortable and fit you well. Are closed at the toe. Do not wear sandals. If you use a stepladder: Make sure that it is fully opened. Do not climb a closed stepladder. Make sure that both sides of the stepladder are locked into place. Ask someone to hold it for you, if possible. Clearly mark and make sure that you can see: Any grab bars or handrails. First and last steps. Where the edge of  each step is. Use tools that help you move around (mobility aids) if they are needed. These include: Canes. Walkers. Scooters. Crutches. Turn on the lights when you go into a dark area. Replace any light bulbs as soon as they burn out. Set up your furniture so you have a clear path. Avoid moving your furniture around. If any of your floors are uneven, fix them. If there are any pets around you, be aware of where they are. Review your medicines with your doctor. Some medicines can make you feel dizzy. This can increase your chance of falling. Ask your doctor what other things that you can do to help prevent falls. This information is not intended to replace advice given to you by your health care provider. Make sure you discuss any questions you have with your health care provider. Document Released: 09/23/2009 Document Revised: 05/04/2016 Document Reviewed: 01/01/2015 Elsevier Interactive Patient Education  2017 [employer].

## 2022-11-29 ENCOUNTER — Other Ambulatory Visit: Payer: Self-pay | Admitting: Family Medicine

## 2022-11-29 DIAGNOSIS — E782 Mixed hyperlipidemia: Secondary | ICD-10-CM

## 2022-11-29 MED ORDER — ATORVASTATIN CALCIUM 40 MG PO TABS: 40.0000 mg | ORAL_TABLET | Freq: Every evening | ORAL | 4 refills | Status: DC

## 2022-12-01 ENCOUNTER — Ambulatory Visit: Payer: Medicare Other | Admitting: Family Medicine

## 2022-12-05 NOTE — Progress Notes (Signed)
I,April Miller,acting as a scribe for Mila Merry, MD.,have documented all relevant documentation on the behalf of Mila Merry, MD,as directed by  Mila Merry, MD while in the presence of Mila Merry, MD.   Established patient visit   Patient: Teresa Shields   DOB: [birth year]   86 y.o. Female  MRN: [id number] Visit Date: 12/06/2022  Today's healthcare provider: Mila Merry, MD   Chief Complaint  Patient presents with   Diabetes   Hypertension   Subjective    HPI  Diabetes Mellitus Type II, follow-up  Lab Results  Component Value Date   HGBA1C 6.3 (A) 07/31/2022   HGBA1C 8.1 (H) 04/26/2022   HGBA1C 8.6 (H) 04/03/2022   Last seen for diabetes 4 months ago.  Management since then includes continuing the same treatment.  Home blood sugar records: fasting range: not checking Most Recent Eye Exam: 06/16/2022  --------------------------------------------------------------------------------------------------- Hypertension, follow-up  BP Readings from Last 3 Encounters:  12/06/22 (!) 124/57  07/31/22 (!) 124/52  04/26/22 (!) 118/51   Wt Readings from Last 3 Encounters:  12/06/22 174 lb (78.9 kg)  11/23/22 170 lb (77.1 kg)  07/31/22 170 lb (77.1 kg)     She was last seen for hypertension 4 months ago.  Management since that visit includes; Well controlled. Continue current medications.   .  Outside blood pressures are 123/74.  --------------------------------------------------------------------------------------------------   Medications: Outpatient Medications Prior to Visit  Medication Sig   acetaminophen (TYLENOL) 500 MG tablet Take 1,000 mg by mouth 2 (two) times daily as needed (for pain.).   allopurinol (ZYLOPRIM) 300 MG tablet TAKE 1 TABLET EVERY DAY   Ascorbic Acid (VITAMIN C) 1000 MG tablet Take 1,000 mg by mouth daily.   aspirin EC 81 MG tablet Take 81 mg by mouth daily.   atorvastatin (LIPITOR) 40 MG tablet Take 1 tablet (40 mg total) by  mouth every evening.   calcium-vitamin D (OSCAL WITH D) 500-200 MG-UNIT tablet Take 1 tablet by mouth daily with breakfast.    colchicine 0.6 MG tablet 2 tablets at first sign of gout, then one daily as needed   glipiZIDE (GLUCOTROL XL) 2.5 MG 24 hr tablet TAKE 1 TABLET(2.5 MG) BY MOUTH DAILY WITH BREAKFAST   indomethacin (INDOCIN) 25 MG capsule Take 1-2 capsules (25-50 mg total) by mouth 2 (two) times daily with a meal. For gout   losartan (COZAAR) 50 MG tablet Take 1 tablet (50 mg total) by mouth daily.   metoprolol succinate (TOPROL-XL) 50 MG 24 hr tablet TAKE 1 TABLET EVERY DAY FOR HIGH BLOOD PRESSURE   Multiple Vitamin (MULTIVITAMIN WITH MINERALS) TABS tablet Take 1 tablet by mouth daily.   Omega-3 Fatty Acids (FISH OIL) 1000 MG CAPS Take 1,000 mg by mouth daily.   pregabalin (LYRICA) 75 MG capsule Take 1 capsule (75 mg total) by mouth 3 (three) times daily.   triamterene-hydrochlorothiazide (MAXZIDE) 75-50 MG tablet TAKE 1 TABLET EVERY DAY   HYDROcodone-acetaminophen (NORCO/VICODIN) 5-325 MG tablet Take 1-2 tablets by mouth every 4 (four) hours as needed for moderate pain. (Patient not taking: Reported on 11/23/2022)   No facility-administered medications prior to visit.    Review of Systems  Constitutional:  Negative for appetite change, chills, fatigue and fever.  Respiratory:  Negative for chest tightness and shortness of breath.   Cardiovascular:  Negative for chest pain and palpitations.  Gastrointestinal:  Negative for abdominal pain, nausea and vomiting.  Neurological:  Negative for dizziness and weakness.  Objective    BP (!) 124/57 (BP Location: Left Arm, Patient Position: Sitting, Cuff Size: Large)   Pulse 71   Resp 16   Wt 174 lb (78.9 kg)   SpO2 95%   BMI 28.08 kg/m    Physical Exam   General: Appearance:    Well developed, well nourished female in no acute distress  Eyes:    PERRL, conjunctiva/corneas clear, EOM's intact       Lungs:     Clear to  auscultation bilaterally, respirations unlabored  Heart:    Normal heart rate. Normal rhythm. No murmurs, rubs, or gallops.    MS:   All extremities are intact.    Neurologic:   Awake, alert, oriented x 3. No apparent focal neurological defect.        Results for orders placed or performed in visit on 12/06/22  POCT glycosylated hemoglobin (Hb A1C)  Result Value Ref Range   Hemoglobin A1C 6.9 (A) 4.0 - 5.6 %   Est. average glucose Bld gHb Est-mCnc 151      Assessment & Plan     1. Type 2 diabetes mellitus with diabetic neuropathy, without long-term current use of insulin (HCC) Well controlled.  Continue current medications.    2. Hypertension, essential, benign Well controlled.  Continue current medications.    3. Estrogen deficiency  - DG Bone density [place]; Future   Follow up 4 months    She reports she had flu and covid vaccines at [facility]  The entirety of the information documented in the History of Present Illness, Review of Systems and Physical Exam were personally obtained by me. Portions of this information were initially documented by the CMA and reviewed by me for thoroughness and accuracy.     Lelon Huh, MD  [facility] [phone] (phone) [phone] (fax)  [place]

## 2022-12-06 ENCOUNTER — Ambulatory Visit (INDEPENDENT_AMBULATORY_CARE_PROVIDER_SITE_OTHER): Payer: Medicare Other | Admitting: Family Medicine

## 2022-12-06 ENCOUNTER — Encounter: Payer: Self-pay | Admitting: Family Medicine

## 2022-12-06 VITALS — BP 124/57 | HR 71 | Resp 16 | Wt 174.0 lb

## 2022-12-06 DIAGNOSIS — E1140 Type 2 diabetes mellitus with diabetic neuropathy, unspecified: Secondary | ICD-10-CM

## 2022-12-06 DIAGNOSIS — E2839 Other primary ovarian failure: Secondary | ICD-10-CM

## 2022-12-06 DIAGNOSIS — I10 Essential (primary) hypertension: Secondary | ICD-10-CM | POA: Diagnosis not present

## 2022-12-06 LAB — POCT GLYCOSYLATED HEMOGLOBIN (HGB A1C)
Est. average glucose Bld gHb Est-mCnc: 151
Hemoglobin A1C: 6.9 % — AB (ref 4.0–5.6)

## 2022-12-06 NOTE — Patient Instructions (Addendum)
Please review the attached list of medications and notify my office if there are any errors.   You are due for a Tdap (tetanus-diptheria-pertussis vaccine) which protects you from tetanus and whooping cough. Please check with your insurance plan or pharmacy regarding coverage for this vaccine.   The CDC recommends two doses of Shingrix (the shingles vaccine) separated by 2 to 6 months for adults age 84 years and older. I recommend checking with your insurance plan regarding coverage for this vaccine.   

## 2022-12-22 ENCOUNTER — Other Ambulatory Visit: Payer: Medicare HMO

## 2023-01-03 ENCOUNTER — Other Ambulatory Visit: Payer: Self-pay | Admitting: Family Medicine

## 2023-01-03 DIAGNOSIS — E1140 Type 2 diabetes mellitus with diabetic neuropathy, unspecified: Secondary | ICD-10-CM

## 2023-01-12 ENCOUNTER — Telehealth: Payer: Self-pay | Admitting: Family Medicine

## 2023-01-12 ENCOUNTER — Other Ambulatory Visit: Payer: Self-pay | Admitting: *Deleted

## 2023-01-12 MED ORDER — ALLOPURINOL 300 MG PO TABS: 300.0000 mg | ORAL_TABLET | Freq: Every day | ORAL | 4 refills | Status: DC

## 2023-01-12 MED ORDER — METOPROLOL SUCCINATE ER 50 MG PO TB24: ORAL_TABLET | ORAL | 4 refills | Status: DC

## 2023-01-12 NOTE — Telephone Encounter (Signed)
Sent refills to [facility]

## 2023-01-12 NOTE — Telephone Encounter (Signed)
[facility] faxed refill request for the following medications:  allopurinol (ZYLOPRIM) 300 MG tablet   metoprolol succinate (TOPROL-XL) 50 MG 24 hr tablet    Please advise.

## 2023-02-09 ENCOUNTER — Other Ambulatory Visit: Payer: Self-pay

## 2023-02-09 ENCOUNTER — Telehealth: Payer: Self-pay | Admitting: Family Medicine

## 2023-02-09 DIAGNOSIS — E1140 Type 2 diabetes mellitus with diabetic neuropathy, unspecified: Secondary | ICD-10-CM

## 2023-02-09 NOTE — Telephone Encounter (Signed)
[facility] requesting prescription refill pregabalin (LYRICA) 75 MG capsule  Please advise

## 2023-02-09 NOTE — Telephone Encounter (Signed)
LOV 12/02/22 NOV 03/30/23 LRF 08/29/22 270 x 1

## 2023-02-10 MED ORDER — PREGABALIN 75 MG PO CAPS: 75.0000 mg | ORAL_CAPSULE | Freq: Three times a day (TID) | ORAL | 1 refills | Status: DC

## 2023-02-23 ENCOUNTER — Other Ambulatory Visit: Payer: Self-pay | Admitting: Family Medicine

## 2023-02-23 DIAGNOSIS — I10 Essential (primary) hypertension: Secondary | ICD-10-CM

## 2023-02-23 MED ORDER — TRIAMTERENE-HCTZ 75-50 MG PO TABS: 1.0000 | ORAL_TABLET | Freq: Every day | ORAL | 4 refills | Status: DC

## 2023-03-13 ENCOUNTER — Telehealth: Payer: Self-pay | Admitting: Family Medicine

## 2023-03-30 ENCOUNTER — Ambulatory Visit: Payer: Medicare Other | Admitting: Family Medicine

## 2023-04-05 ENCOUNTER — Other Ambulatory Visit: Payer: Self-pay | Admitting: Family Medicine

## 2023-04-05 DIAGNOSIS — E1140 Type 2 diabetes mellitus with diabetic neuropathy, unspecified: Secondary | ICD-10-CM

## 2023-04-05 NOTE — Telephone Encounter (Signed)
Requested Prescriptions  Pending Prescriptions Disp Refills   glipiZIDE (GLUCOTROL XL) 2.5 MG 24 hr tablet [Pharmacy Med Name: GLIPIZIDE ER 2.5MG  TABLETS] 90 tablet 0    Sig: TAKE 1 TABLET(2.5 MG) BY MOUTH DAILY WITH BREAKFAST     Endocrinology:  Diabetes - Sulfonylureas Failed - 04/05/2023  5:14 PM      Failed - Cr in normal range and within 360 days    Creatinine, Ser  Date Value Ref Range Status  04/03/2022 1.21 (H) 0.57 - 1.00 mg/dL Final         Passed - HBA1C is between 0 and 7.9 and within 180 days    Hemoglobin A1C  Date Value Ref Range Status  12/06/2022 6.9 (A) 4.0 - 5.6 % Final   Hgb A1c MFr Bld  Date Value Ref Range Status  04/26/2022 8.1 (H) 4.8 - 5.6 % Final    Comment:             Prediabetes: 5.7 - 6.4          Diabetes: >6.4          Glycemic control for adults with diabetes: <7.0          Passed - Valid encounter within last 6 months    Recent Outpatient Visits           4 months ago Type 2 diabetes mellitus with diabetic neuropathy, without long-term current use of insulin (HCC)   [place] [facility] [name], MD   8 months ago Type 2 diabetes mellitus with diabetic neuropathy, without long-term current use of insulin (HCC)   [place] [facility] [name], MD   11 months ago Type 2 diabetes mellitus with diabetic neuropathy, without long-term current use of insulin (HCC)   [place] [facility] [name], MD   1 year ago Type 2 diabetes mellitus with diabetic neuropathy, without long-term current use of insulin (HCC)   [place] [facility] [name], MD   1 year ago Type 2 diabetes mellitus with diabetic neuropathy, without long-term current use of insulin (HCC)   [place] [facility] [name], MD       Future Appointments             In 6 days [name], [name], MD [facility]

## 2023-04-10 NOTE — Progress Notes (Unsigned)
Established patient visit   Patient: Teresa Shields   DOB: 01-12-1936   87 y.o. Female  MRN: [id number] Visit Date: 04/11/2023  Today's healthcare provider: Mila Merry, MD   No chief complaint on file.  Subjective    HPI  Diabetes Mellitus Type II, Follow-up  Lab Results  Component Value Date   HGBA1C 6.9 (A) 12/06/2022   HGBA1C 6.3 (A) 07/31/2022   HGBA1C 8.1 (H) 04/26/2022   Wt Readings from Last 3 Encounters:  12/06/22 174 lb (78.9 kg)  11/23/22 170 lb (77.1 kg)  07/31/22 170 lb (77.1 kg)   Last seen for diabetes 4 months ago.  Management since then includes ***. She reports {excellent/good/fair/poor:19665} compliance with treatment. She {is/is not:21021397} having side effects. {document side effects if present:1} Symptoms: {Yes/No:20286} fatigue {Yes/No:20286} foot ulcerations  {Yes/No:20286} appetite changes {Yes/No:20286} nausea  {Yes/No:20286} paresthesia of the feet  {Yes/No:20286} polydipsia  {Yes/No:20286} polyuria {Yes/No:20286} visual disturbances   {Yes/No:20286} vomiting     Home blood sugar records: {diabetes glucometry results:16657}  Episodes of hypoglycemia? {Yes/No:20286} {enter symptoms and frequency of symptoms if yes:1}   Current insulin regiment: {enter 'none' or type of insulin and number of units taken with each dose of each insulin formulation that the patient is taking:1} Most Recent Eye Exam: *** {Current exercise:16438:::1} {Current diet habits:16563:::1}  Pertinent Labs: Lab Results  Component Value Date   CHOL 175 04/03/2022   HDL 36 (L) 04/03/2022   LDLCALC 67 04/03/2022   TRIG 464 (H) 04/03/2022   CHOLHDL 4.9 (H) 04/03/2022   Lab Results  Component Value Date   NA 142 04/03/2022   K 4.6 04/03/2022   CREATININE 1.21 (H) 04/03/2022   EGFR 44 (L) 04/03/2022   LABMICR 208.6 04/03/2022   MICRALBCREAT 104 (H) 04/03/2022      --------------------------------------------------------------------------------------------------- Hypertension, follow-up  BP Readings from Last 3 Encounters:  12/06/22 (!) 124/57  07/31/22 (!) 124/52  04/26/22 (!) 118/51   Wt Readings from Last 3 Encounters:  12/06/22 174 lb (78.9 kg)  11/23/22 170 lb (77.1 kg)  07/31/22 170 lb (77.1 kg)     She was last seen for hypertension 4 months ago.  BP at that visit was as above. Management since that visit includes none.  She reports {excellent/good/fair/poor:19665} compliance with treatment. She {is/is not:9024} having side effects. {document side effects if present:1} She is following a {diet:21022986} diet. She {is/is not:9024} exercising. She {does/does not:200015} smoke.  Use of agents associated with hypertension: {bp agents assoc with hypertension:511::"none"}.   Outside blood pressures are {***enter patient reported home BP readings, or 'not being checked':1}. Symptoms: {Yes/No:20286} chest pain {Yes/No:20286} chest pressure  {Yes/No:20286} palpitations {Yes/No:20286} syncope  {Yes/No:20286} dyspnea {Yes/No:20286} orthopnea  {Yes/No:20286} paroxysmal nocturnal dyspnea {Yes/No:20286} lower extremity edema   Pertinent labs Lab Results  Component Value Date   CHOL 175 04/03/2022   HDL 36 (L) 04/03/2022   LDLCALC 67 04/03/2022   TRIG 464 (H) 04/03/2022   CHOLHDL 4.9 (H) 04/03/2022   Lab Results  Component Value Date   NA 142 04/03/2022   K 4.6 04/03/2022   CREATININE 1.21 (H) 04/03/2022   EGFR 44 (L) 04/03/2022   GLUCOSE 157 (H) 04/26/2022   TSH 1.690 01/12/2017     The ASCVD Risk score (Arnett DK, et al., 2019) failed to calculate for the following reasons:   The 2019 ASCVD risk score is only valid for ages 92 to 56  ---------------------------------------------------------------------------------------------------   Medications: Outpatient Medications Prior to Visit  Medication  Sig   acetaminophen  (TYLENOL) 500 MG tablet Take 1,000 mg by mouth 2 (two) times daily as needed (for pain.).   allopurinol (ZYLOPRIM) 300 MG tablet Take 1 tablet (300 mg total) by mouth daily.   Ascorbic Acid (VITAMIN C) 1000 MG tablet Take 1,000 mg by mouth daily.   aspirin EC 81 MG tablet Take 81 mg by mouth daily.   atorvastatin (LIPITOR) 40 MG tablet Take 1 tablet (40 mg total) by mouth every evening.   calcium-vitamin D (OSCAL WITH D) 500-200 MG-UNIT tablet Take 1 tablet by mouth daily with breakfast.    colchicine 0.6 MG tablet 2 tablets at first sign of gout, then one daily as needed   glipiZIDE (GLUCOTROL XL) 2.5 MG 24 hr tablet TAKE 1 TABLET(2.5 MG) BY MOUTH DAILY WITH BREAKFAST   HYDROcodone-acetaminophen (NORCO/VICODIN) 5-325 MG tablet Take 1-2 tablets by mouth every 4 (four) hours as needed for moderate pain. (Patient not taking: Reported on 11/23/2022)   indomethacin (INDOCIN) 25 MG capsule Take 1-2 capsules (25-50 mg total) by mouth 2 (two) times daily with a meal. For gout   losartan (COZAAR) 50 MG tablet Take 1 tablet (50 mg total) by mouth daily.   metoprolol succinate (TOPROL-XL) 50 MG 24 hr tablet TAKE 1 TABLET EVERY DAY FOR HIGH BLOOD PRESSURE   Multiple Vitamin (MULTIVITAMIN WITH MINERALS) TABS tablet Take 1 tablet by mouth daily.   Omega-3 Fatty Acids (FISH OIL) 1000 MG CAPS Take 1,000 mg by mouth daily.   pregabalin (LYRICA) 75 MG capsule Take 1 capsule (75 mg total) by mouth 3 (three) times daily.   triamterene-hydrochlorothiazide (MAXZIDE) 75-50 MG tablet Take 1 tablet by mouth daily.   No facility-administered medications prior to visit.    Review of Systems  {Labs  Heme  Chem  Endocrine  Serology  Results Review (optional):23779}   Objective    There were no vitals taken for this visit. {Show previous vital signs (optional):23777}  Physical Exam  ***  No results found for any visits on 04/11/23.  Assessment & Plan     ***  No follow-ups on file.      {provider  attestation***:1}   Mila Merry, MD  [facility] [phone] (phone) [phone] (fax)  [facility]

## 2023-04-11 ENCOUNTER — Ambulatory Visit (INDEPENDENT_AMBULATORY_CARE_PROVIDER_SITE_OTHER): Payer: Medicare Other | Admitting: Family Medicine

## 2023-04-11 VITALS — BP 120/74 | HR 74 | Temp 98.0°F | Resp 16 | Wt 176.0 lb

## 2023-04-11 DIAGNOSIS — N183 Chronic kidney disease, stage 3 unspecified: Secondary | ICD-10-CM

## 2023-04-11 DIAGNOSIS — E782 Mixed hyperlipidemia: Secondary | ICD-10-CM | POA: Diagnosis not present

## 2023-04-11 DIAGNOSIS — E1140 Type 2 diabetes mellitus with diabetic neuropathy, unspecified: Secondary | ICD-10-CM

## 2023-04-11 DIAGNOSIS — M2550 Pain in unspecified joint: Secondary | ICD-10-CM

## 2023-04-11 DIAGNOSIS — M8580 Other specified disorders of bone density and structure, unspecified site: Secondary | ICD-10-CM

## 2023-04-11 DIAGNOSIS — E559 Vitamin D deficiency, unspecified: Secondary | ICD-10-CM

## 2023-04-11 LAB — POCT GLYCOSYLATED HEMOGLOBIN (HGB A1C)
Est. average glucose Bld gHb Est-mCnc: 146
Hemoglobin A1C: 6.7 % — AB (ref 4.0–5.6)

## 2023-04-11 NOTE — Patient Instructions (Signed)
.   Please review the attached list of medications and notify my office if there are any errors.   . Please bring all of your medications to every appointment so we can make sure that our medication list is the same as yours.   

## 2023-04-12 LAB — COMPREHENSIVE METABOLIC PANEL
ALT: 23 IU/L (ref 0–32)
AST: 18 IU/L (ref 0–40)
Albumin/Globulin Ratio: 2.2 (ref 1.2–2.2)
Albumin: 4.4 g/dL (ref 3.7–4.7)
Alkaline Phosphatase: 121 IU/L (ref 44–121)
BUN/Creatinine Ratio: 31 — ABNORMAL HIGH (ref 12–28)
BUN: 41 mg/dL — ABNORMAL HIGH (ref 8–27)
Bilirubin Total: 0.4 mg/dL (ref 0.0–1.2)
CO2: 22 mmol/L (ref 20–29)
Calcium: 10.2 mg/dL (ref 8.7–10.3)
Chloride: 104 mmol/L (ref 96–106)
Creatinine, Ser: 1.33 mg/dL — ABNORMAL HIGH (ref 0.57–1.00)
Globulin, Total: 2 g/dL (ref 1.5–4.5)
Glucose: 183 mg/dL — ABNORMAL HIGH (ref 70–99)
Potassium: 5.1 mmol/L (ref 3.5–5.2)
Sodium: 144 mmol/L (ref 134–144)
Total Protein: 6.4 g/dL (ref 6.0–8.5)
eGFR: 39 mL/min/{1.73_m2} — ABNORMAL LOW (ref >59)

## 2023-04-12 LAB — LIPID PANEL
Chol/HDL Ratio: 5 ratio — ABNORMAL HIGH (ref 0.0–4.4)
Cholesterol, Total: 174 mg/dL (ref 100–199)
HDL: 35 mg/dL — ABNORMAL LOW (ref >39)
LDL Chol Calc (NIH): 66 mg/dL (ref 0–99)
Triglycerides: 469 mg/dL — ABNORMAL HIGH (ref 0–149)
VLDL Cholesterol Cal: 73 mg/dL — ABNORMAL HIGH (ref 5–40)

## 2023-04-12 LAB — MICROALBUMIN / CREATININE URINE RATIO
Creatinine, Urine: 138 mg/dL
Microalb/Creat Ratio: 25 mg/g creat (ref 0–29)
Microalbumin, Urine: 34.7 ug/mL

## 2023-04-12 LAB — VITAMIN D 25 HYDROXY (VIT D DEFICIENCY, FRACTURES): Vit D, 25-Hydroxy: 37.4 ng/mL (ref 30.0–100.0)

## 2023-07-02 ENCOUNTER — Other Ambulatory Visit: Payer: Self-pay | Admitting: Family Medicine

## 2023-07-02 DIAGNOSIS — E1140 Type 2 diabetes mellitus with diabetic neuropathy, unspecified: Secondary | ICD-10-CM

## 2023-07-20 ENCOUNTER — Other Ambulatory Visit: Payer: Self-pay | Admitting: Family Medicine

## 2023-07-20 DIAGNOSIS — E1140 Type 2 diabetes mellitus with diabetic neuropathy, unspecified: Secondary | ICD-10-CM

## 2023-07-20 NOTE — Telephone Encounter (Unsigned)
Copied from CRM [id number]. Topic: General - Other >> Jul 20, 2023  4:21 PM Everette C wrote: Reason for CRM: Medication Refill - Medication: glipiZIDE (GLUCOTROL XL) 2.5 MG 24 hr tablet [[id number]]  Has the patient contacted their pharmacy? Yes.   (Agent: If no, request that the patient contact the pharmacy for the refill. If patient does not wish to contact the pharmacy document the reason why and proceed with request.) (Agent: If yes, when and what did the pharmacy advise?)  Preferred Pharmacy (with phone number or street name): [facility] - [place], [place] - [address] [place] [address] Phone: [phone] Fax: [phone] Hours: Not open 24 hours   Has the patient been seen for an appointment in the last year OR does the patient have an upcoming appointment? Yes.    Agent: Please be advised that RX refills may take up to 3 business days. We ask that you follow-up with your pharmacy.

## 2023-07-23 MED ORDER — GLIPIZIDE ER 2.5 MG PO TB24: 2.5000 mg | ORAL_TABLET | Freq: Every day | ORAL | 0 refills | Status: DC

## 2023-07-23 NOTE — Telephone Encounter (Signed)
Requested medication (s) are due for refill today: No  Requested medication (s) are on the active medication list: Yes  Last refill:  07/03/23  Future visit scheduled: Yes  Notes to clinic:  See request.    Requested Prescriptions  Pending Prescriptions Disp Refills   glipiZIDE (GLUCOTROL XL) 2.5 MG 24 hr tablet 90 tablet 0     Endocrinology:  Diabetes - Sulfonylureas Failed - 07/20/2023  4:52 PM      Failed - Cr in normal range and within 360 days    Creatinine, Ser  Date Value Ref Range Status  04/11/2023 1.33 (H) 0.57 - 1.00 mg/dL Final         Passed - HBA1C is between 0 and 7.9 and within 180 days    Hemoglobin A1C  Date Value Ref Range Status  04/11/2023 6.7 (A) 4.0 - 5.6 % Final   Hgb A1c MFr Bld  Date Value Ref Range Status  04/26/2022 8.1 (H) 4.8 - 5.6 % Final    Comment:             Prediabetes: 5.7 - 6.4          Diabetes: >6.4          Glycemic control for adults with diabetes: <7.0          Passed - Valid encounter within last 6 months    Recent Outpatient Visits           3 months ago Type 2 diabetes mellitus with diabetic neuropathy, without long-term current use of insulin (HCC)   [facility] [name], MD   7 months ago Type 2 diabetes mellitus with diabetic neuropathy, without long-term current use of insulin (HCC)   [place] [name], MD   11 months ago Type 2 diabetes mellitus with diabetic neuropathy, without long-term current use of insulin (HCC)   [facility] [name], MD   1 year ago Type 2 diabetes mellitus with diabetic neuropathy, without long-term current use of insulin (HCC)   [place] [facility] [name], MD   1 year ago Type 2 diabetes mellitus with diabetic neuropathy, without long-term current use of insulin (HCC)   [place] [facility] Sherrie Mustache, [name], MD

## 2023-08-27 ENCOUNTER — Other Ambulatory Visit: Payer: Self-pay | Admitting: Family Medicine

## 2023-08-27 DIAGNOSIS — E1140 Type 2 diabetes mellitus with diabetic neuropathy, unspecified: Secondary | ICD-10-CM

## 2023-08-27 NOTE — Telephone Encounter (Signed)
Medication Refill - Medication: pregablin 75 mg  Has the patient contacted their pharmacy? Yes.   (Agent: If no, request that the patient contact the pharmacy for the refill. If patient does not wish to contact the pharmacy document the reason why and proceed with request.) (Agent: If yes, when and what did the pharmacy advise?)  Preferred Pharmacy (with phone number or street name): [facility] mail order Has the patient been seen for an appointment in the last year OR does the patient have an upcoming appointment? Yes.    Agent: Please be advised that RX refills may take up to 3 business days. We ask that you follow-up with your pharmacy.

## 2023-08-28 ENCOUNTER — Other Ambulatory Visit: Payer: Self-pay | Admitting: Family Medicine

## 2023-08-28 MED ORDER — PREGABALIN 75 MG PO CAPS
75.0000 mg | ORAL_CAPSULE | Freq: Three times a day (TID) | ORAL | 1 refills | Status: DC
Start: 2023-08-28 — End: 2024-02-12

## 2023-11-14 ENCOUNTER — Telehealth: Payer: Self-pay | Admitting: Family Medicine

## 2023-11-14 DIAGNOSIS — I10 Essential (primary) hypertension: Secondary | ICD-10-CM

## 2023-11-14 DIAGNOSIS — E1140 Type 2 diabetes mellitus with diabetic neuropathy, unspecified: Secondary | ICD-10-CM

## 2023-11-14 MED ORDER — GLIPIZIDE ER 2.5 MG PO TB24: 2.5000 mg | ORAL_TABLET | Freq: Every day | ORAL | 0 refills | Status: DC

## 2023-11-14 MED ORDER — LOSARTAN POTASSIUM 50 MG PO TABS: 50.0000 mg | ORAL_TABLET | Freq: Every day | ORAL | 0 refills | Status: DC

## 2023-11-14 NOTE — Telephone Encounter (Signed)
[facility] mail service pharmacy faxed refill request for the following medications:   losartan (COZAAR) 50 MG tablet    Please advise

## 2023-11-14 NOTE — Addendum Note (Signed)
Addended by: Lily Kocher on: 11/14/2023 01:29 PM   Modules accepted: Orders

## 2023-11-14 NOTE — Telephone Encounter (Signed)
Received fax for refills on Glipizide ER 2.5 mg. #90 to [facility]

## 2023-11-19 MED ORDER — LOSARTAN POTASSIUM 50 MG PO TABS: 50.0000 mg | ORAL_TABLET | Freq: Every day | ORAL | 0 refills | Status: DC

## 2023-11-19 MED ORDER — GLIPIZIDE ER 2.5 MG PO TB24
2.5000 mg | ORAL_TABLET | Freq: Every day | ORAL | 0 refills | Status: DC
Start: 2023-11-19 — End: 2024-01-07

## 2023-11-19 NOTE — Telephone Encounter (Signed)
Received another fax from [facility] requesting this medication.  Looks like it was sent to the local [facility] instead.

## 2023-11-19 NOTE — Telephone Encounter (Signed)
Rx sent 

## 2023-11-19 NOTE — Addendum Note (Signed)
Addended by: Lily Kocher on: 11/19/2023 09:41 AM   Modules accepted: Orders

## 2023-11-19 NOTE — Telephone Encounter (Signed)
Received another fax from [facility] for this medication.  Looks like it was sent to local [facility] instead.

## 2023-11-19 NOTE — Addendum Note (Signed)
Addended by: Lily Kocher on: 11/19/2023 09:43 AM   Modules accepted: Orders

## 2023-11-28 ENCOUNTER — Ambulatory Visit (INDEPENDENT_AMBULATORY_CARE_PROVIDER_SITE_OTHER): Payer: Medicare Other | Admitting: Emergency Medicine

## 2023-11-28 VITALS — Ht 66.0 in | Wt 175.0 lb

## 2023-11-28 DIAGNOSIS — Z0000 Encounter for general adult medical examination without abnormal findings: Secondary | ICD-10-CM | POA: Diagnosis not present

## 2023-11-28 NOTE — Patient Instructions (Addendum)
Teresa Shields , Thank you for taking time to come for your Medicare Wellness Visit. I appreciate your ongoing commitment to your health goals. Please review the following plan we discussed and let me know if I can assist you in the future.   Referrals/Orders/Follow-Ups/Clinician Recommendations: Get a tetanus and shingles vaccines at your earliest convenience. I have scheduled you an office visit with Dr. Sherrie Mustache on 12/24/23 @ 10:00am. You can get a diabetic foot exam at that time. Recommend getting a diabetic eye exam yearly.  This is a list of the screening recommended for you and due dates:  Health Maintenance  Topic Date Due   Zoster (Shingles) Vaccine (1 of 2) 09/02/1955   COVID-19 Vaccine (3 - Pfizer risk series) 03/16/2020   DTaP/Tdap/Td vaccine (2 - Td or Tdap) 08/28/2022   Eye exam for diabetics  06/17/2023   Hemoglobin A1C  10/12/2023   Yearly kidney function blood test for diabetes  04/10/2024   Yearly kidney health urinalysis for diabetes  04/10/2024   DEXA scan (bone density measurement)  10/06/2024   Medicare Annual Wellness Visit  11/27/2024   Pneumonia Vaccine  Completed   Flu Shot  Completed   HPV Vaccine  Aged Out    Advanced directives: (ACP Link)Information on Advanced Care Planning can be found at [facility] [place] Advance Health Care Directives Advance Health Care Directives ([email or web address]   Once you have completed the forms, please bring a copy of your health care power of attorney and living will to the office to be added to your chart at your convenience.   Next Medicare Annual Wellness Visit scheduled for next year: Yes, 12/10/24 @ 10:50am  Fall Prevention in the Home, Adult Falls can cause injuries and affect people of all ages. There are many simple things that you can do to make your home safe and to help prevent falls. If you need it, ask for help making these changes. What actions can I take to prevent falls? General information Use good  lighting in all rooms. Make sure to: Replace any light bulbs that burn out. Turn on lights if it is dark and use night-lights. Keep items that you use often in easy-to-reach places. Lower the shelves around your home if needed. Move furniture so that there are clear paths around it. Do not keep throw rugs or other things on the floor that can make you trip. If any of your floors are uneven, fix them. Add color or contrast paint or tape to clearly mark and help you see: Grab bars or handrails. First and last steps of staircases. Where the edge of each step is. If you use a ladder or stepladder: Make sure that it is fully opened. Do not climb a closed ladder. Make sure the sides of the ladder are locked in place. Have someone hold the ladder while you use it. Know where your pets are as you move through your home. What can I do in the bathroom?     Keep the floor dry. Clean up any water that is on the floor right away. Remove soap buildup in the bathtub or shower. Buildup makes bathtubs and showers slippery. Use non-skid mats or decals on the floor of the bathtub or shower. Attach bath mats securely with double-sided, non-slip rug tape. If you need to sit down while you are in the shower, use a non-slip stool. Install grab bars by the toilet and in the bathtub and shower. Do not use towel bars as  grab bars. What can I do in the bedroom? Make sure that you have a light by your bed that is easy to reach. Do not use any sheets or blankets on your bed that hang to the floor. Have a firm bench or chair with side arms that you can use for support when you get dressed. What can I do in the kitchen? Clean up any spills right away. If you need to reach something above you, use a sturdy step stool that has a grab bar. Keep electrical cables out of the way. Do not use floor polish or wax that makes floors slippery. What can I do with my stairs? Do not leave anything on the stairs. Make sure  that you have a light switch at the top and the bottom of the stairs. Have them installed if you do not have them. Make sure that there are handrails on both sides of the stairs. Fix handrails that are broken or loose. Make sure that handrails are as long as the staircases. Install non-slip stair treads on all stairs in your home if they do not have carpet. Avoid having throw rugs at the top or bottom of stairs, or secure the rugs with carpet tape to prevent them from moving. Choose a carpet design that does not hide the edge of steps on the stairs. Make sure that carpet is firmly attached to the stairs. Fix any carpet that is loose or worn. What can I do on the outside of my home? Use bright outdoor lighting. Repair the edges of walkways and driveways and fix any cracks. Clear paths of anything that can make you trip, such as tools or rocks. Add color or contrast paint or tape to clearly mark and help you see high doorway thresholds. Trim any bushes or trees on the main path into your home. Check that handrails are securely fastened and in good repair. Both sides of all steps should have handrails. Install guardrails along the edges of any raised decks or porches. Have leaves, snow, and ice cleared regularly. Use sand, salt, or ice melt on walkways during winter months if you live where there is ice and snow. In the garage, clean up any spills right away, including grease or oil spills. What other actions can I take? Review your medicines with your health care provider. Some medicines can make you confused or feel dizzy. This can increase your chance of falling. Wear closed-toe shoes that fit well and support your feet. Wear shoes that have rubber soles and low heels. Use a cane, walker, scooter, or crutches that help you move around if needed. Talk with your provider about other ways that you can decrease your risk of falls. This may include seeing a physical therapist to learn to do exercises to  improve movement and strength. Where to find more information Centers for Disease Control and Prevention, [employer]AD[employer]: TonerPromos.no [employer] on Aging: BaseRingTones.pl National Institute on Aging: BaseRingTones.pl Contact a health care provider if: You are afraid of falling at home. You feel weak, drowsy, or dizzy at home. You fall at home. Get help right away if you: Lose consciousness or have trouble moving after a fall. Have a fall that causes a head injury. These symptoms may be an emergency. Get help right away. Call 911. Do not wait to see if the symptoms will go away. Do not drive yourself to the hospital. This information is not intended to replace advice given to you by your health care provider.  Make sure you discuss any questions you have with your health care provider. Document Revised: 07/31/2022 Document Reviewed: 07/31/2022 Elsevier Patient Education  2024 [employer].

## 2023-11-28 NOTE — Progress Notes (Signed)
Subjective:   Teresa Shields is a 87 y.o. female who presents for Medicare Annual (Subsequent) preventive examination.  Visit Complete: Virtual I connected with  Teresa Shields on 11/28/23 by a audio enabled telemedicine application and verified that I am speaking with the correct person using two identifiers.  Patient Location: Home  Provider Location: Home Office  I discussed the limitations of evaluation and management by telemedicine. The patient expressed understanding and agreed to proceed.  Vital Signs: Because this visit was a virtual/telehealth visit, some criteria may be missing or patient reported. Any vitals not documented were not able to be obtained and vitals that have been documented are patient reported.   Cardiac Risk Factors include: advanced age (>72men, >19 women);diabetes mellitus;hypertension;dyslipidemia     Objective:    Today's Vitals   11/28/23 1110 11/28/23 1111  Weight: 175 lb (79.4 kg)   Height: 5\' 6"  (1.676 m)   PainSc:  2    Body mass index is 28.25 kg/m.     11/28/2023   11:24 AM 11/23/2022    1:32 PM 11/22/2021    1:54 PM 03/18/2020    2:44 PM 04/30/2018   11:15 AM 04/11/2018    4:58 PM 04/11/2018    9:00 AM  Advanced Directives  Does Patient Have a Medical Advance Directive? No No No No No No Yes  Does patient want to make changes to medical advance directive?     No - Patient declined No - Patient declined No - Patient declined  Would patient like information on creating a medical advance directive? Yes (MAU/Ambulatory/Procedural Areas - Information given) No - Patient declined No - Patient declined No - Patient declined No - Patient declined No - Patient declined No - Patient declined    Current Medications (verified) Outpatient Encounter Medications as of 11/28/2023  Medication Sig   acetaminophen (TYLENOL) 500 MG tablet Take 1,000 mg by mouth 2 (two) times daily as needed (for pain.).   allopurinol (ZYLOPRIM) 300 MG tablet Take 1  tablet (300 mg total) by mouth daily.   Ascorbic Acid (VITAMIN C) 1000 MG tablet Take 1,000 mg by mouth daily.   aspirin EC 81 MG tablet Take 81 mg by mouth daily.   atorvastatin (LIPITOR) 40 MG tablet Take 1 tablet (40 mg total) by mouth every evening.   calcium-vitamin D (OSCAL WITH D) 500-200 MG-UNIT tablet Take 1 tablet by mouth daily with breakfast.    glipiZIDE (GLUCOTROL XL) 2.5 MG 24 hr tablet Take 1 tablet (2.5 mg total) by mouth daily with breakfast.   losartan (COZAAR) 50 MG tablet Take 1 tablet (50 mg total) by mouth daily.   metoprolol succinate (TOPROL-XL) 50 MG 24 hr tablet TAKE 1 TABLET EVERY DAY FOR HIGH BLOOD PRESSURE   Multiple Vitamin (MULTIVITAMIN WITH MINERALS) TABS tablet Take 1 tablet by mouth daily.   Omega-3 Fatty Acids (FISH OIL) 1000 MG CAPS Take 1,000 mg by mouth daily.   pregabalin (LYRICA) 75 MG capsule Take 1 capsule (75 mg total) by mouth 3 (three) times daily.   triamterene-hydrochlorothiazide (MAXZIDE) 75-50 MG tablet Take 1 tablet by mouth daily.   No facility-administered encounter medications on file as of 11/28/2023.    Allergies (verified) Metformin and related and Naproxen   History: Past Medical History:  Diagnosis Date   Gout    History of chicken pox    Hypertension    Past Surgical History:  Procedure Laterality Date   Bone Density Study  01/08/2011  T-1.2, L-Spine, T-1.9 Osteopenia   CARPAL TUNNEL RELEASE Right    CATARACT EXTRACTION, BILATERAL     Left hip skin cancer resection  2010   Dermatology   ORIF ANKLE FRACTURE Right 04/11/2018   Procedure: OPEN REDUCTION INTERNAL FIXATION (ORIF) ANKLE FRACTURE;  Surgeon: Kennedy Bucker, MD;  Location: [facility] ORS;  Service: Orthopedics;  Laterality: Right;   TUBAL LIGATION     Family History  Problem Relation Age of Onset   Healthy Mother 44   Healthy Father 25   AAA (abdominal aortic aneurysm) Sister    Gout Other    Social History   Socioeconomic History   Marital status: Widowed     Spouse name: Not on file   Number of children: 2   Years of education: Not on file   Highest education level: 10th grade  Occupational History   Occupation: retired  Tobacco Use   Smoking status: Never   Smokeless tobacco: Never  Vaping Use   Vaping status: Never Used  Substance and Sexual Activity   Alcohol use: No   Drug use: No   Sexual activity: Not on file  Other Topics Concern   Not on file  Social History Narrative   Not on file   Social Drivers of Health   Financial Resource Strain: Low Risk  (11/28/2023)   Overall Financial Resource Strain (CARDIA)    Difficulty of Paying Living Expenses: Not hard at all  Food Insecurity: No Food Insecurity (11/28/2023)   Hunger Vital Sign    Worried About Running Out of Food in the Last Year: Never true    Ran Out of Food in the Last Year: Never true  Transportation Needs: No Transportation Needs (11/28/2023)   PRAPARE - Administrator, Civil Service (Medical): No    Lack of Transportation (Non-Medical): No  Physical Activity: Inactive (11/28/2023)   Exercise Vital Sign    Days of Exercise per Week: 0 days    Minutes of Exercise per Session: 0 min  Stress: No Stress Concern Present (11/28/2023)   [employer] of Occupational Health - Occupational Stress Questionnaire    Feeling of Stress : Not at all  Social Connections: Socially Isolated (11/28/2023)   Social Connection and Isolation Panel [NHANES]    Frequency of Communication with Friends and Family: More than three times a week    Frequency of Social Gatherings with Friends and Family: More than three times a week    Attends Religious Services: Never    Database administrator or Organizations: No    Attends Banker Meetings: Never    Marital Status: Widowed    Tobacco Counseling Counseling given: Not Answered   Clinical Intake:  Pre-visit preparation completed: Yes  Pain : 0-10 Pain Score: 2  Pain Type: Neuropathic pain, Chronic  pain Pain Location: Hand Pain Orientation: Left, Right Pain Descriptors / Indicators: Aching     BMI - recorded: 28.25 Nutritional Status: BMI 25 -29 Overweight Nutritional Risks: None Diabetes: Yes CBG done?: No Did pt. bring in CBG monitor from home?: No  How often do you need to have someone help you when you read instructions, pamphlets, or other written materials from your doctor or pharmacy?: 1 - Never  Interpreter Needed?: No  Information entered by :: Tora Kindred, CMA   Activities of Daily Living    11/28/2023   11:14 AM 04/11/2023   10:53 AM  In your present state of health, do you have any difficulty performing  the following activities:  Hearing? 0 0  Vision? 0 0  Difficulty concentrating or making decisions? 0 0  Walking or climbing stairs? 1 1  Comment uses rollator   Dressing or bathing? 0 0  Doing errands, shopping? 1 1  Comment daughter takes to appointments   Preparing Food and eating ? Y   Comment daughter helps with cooking   Using the Toilet? N   In the past six months, have you accidently leaked urine? N   Do you have problems with loss of bowel control? N   Managing your Medications? N   Managing your Finances? N   Housekeeping or managing your Housekeeping? N     Patient Care Team: [name], MD as PCP - General (Family Medicine) [facility], [facility] (Optometry)  Indicate any recent Medical Services you may have received from other than [facility] providers in the past year (date may be approximate).     Assessment:   This is a routine wellness examination for Teresa Shields.  Hearing/Vision screen Hearing Screening - Comments:: Denies hearing loss Vision Screening - Comments:: Gets eye exams   Goals Addressed               This Visit's Progress     Patient Stated (pt-stated)        Walk without a walker      Depression Screen    11/28/2023   11:22 AM 04/11/2023   10:53 AM 11/23/2022    1:30 PM 04/26/2022    9:39 AM 11/22/2021     1:51 PM 03/14/2021    8:28 AM 05/04/2020    2:06 PM  PHQ 2/9 Scores  PHQ - 2 Score 0 0 0 0 0 0 0  PHQ- 9 Score  0 0 1  0     Fall Risk    11/28/2023   11:19 AM 04/11/2023   10:53 AM 11/23/2022    1:33 PM 04/26/2022    9:39 AM 11/22/2021    1:59 PM  Fall Risk   Falls in the past year? 0 0 0 0 0  Number falls in past yr: 0 0 0 0 0  Injury with Fall? 0 0 0 0 0  Risk for fall due to : Orthopedic patient;Impaired balance/gait;Impaired mobility  No Fall Risks;Impaired balance/gait  Impaired mobility  Follow up Falls prevention discussed;Education provided;Falls evaluation completed  Falls prevention discussed;Falls evaluation completed Falls evaluation completed Falls prevention discussed    MEDICARE RISK AT HOME: Medicare Risk at Home Any stairs in or around the home?: No (wheelchair ramp) If so, are there any without handrails?: No Home free of loose throw rugs in walkways, pet beds, electrical cords, etc?: Yes Adequate lighting in your home to reduce risk of falls?: Yes Life alert?: No Use of a cane, walker or w/c?: Yes (rollator) Grab bars in the bathroom?: Yes Shower chair or bench in shower?: Yes Elevated toilet seat or a handicapped toilet?: Yes  TIMED UP AND GO:  Was the test performed?  No    Cognitive Function:        11/28/2023   11:25 AM 11/23/2022    1:37 PM 01/05/2017   10:22 AM  6CIT Screen  What Year? 0 points 0 points 0 points  What month? 0 points 0 points 0 points  What time? 0 points 0 points 0 points  Count back from 20 0 points 0 points 0 points  Months in reverse 0 points 0 points 4 points  Repeat  phrase 0 points 4 points 2 points  Total Score 0 points 4 points 6 points    Immunizations Immunization History  Administered Date(s) Administered   Fluad Quad(high Dose 65+) 09/17/2019, 09/27/2020, 11/01/2021   Influenza, High Dose Seasonal PF 09/30/2015, 09/19/2016, 09/11/2017, 09/24/2018   PFIZER(Purple Top)SARS-COV-2 Vaccination 01/23/2020,  02/17/2020   Pneumococcal Conjugate-13 09/02/2014   Pneumococcal Polysaccharide-23 01/05/2017   Tdap 08/28/2012   Zoster, Live 08/28/2012    TDAP status: Due, Education has been provided regarding the importance of this vaccine. Advised may receive this vaccine at local pharmacy or Health Dept. Aware to provide a copy of the vaccination record if obtained from local pharmacy or Health Dept. Verbalized acceptance and understanding.  Flu Vaccine status: Up to date  Pneumococcal vaccine status: Up to date  Covid-19 vaccine status: Declined, Education has been provided regarding the importance of this vaccine but patient still declined. Advised may receive this vaccine at local pharmacy or Health Dept.or vaccine clinic. Aware to provide a copy of the vaccination record if obtained from local pharmacy or Health Dept. Verbalized acceptance and understanding.  Qualifies for Shingles Vaccine? Yes   Zostavax completed Yes   Shingrix Completed?: No.    Education has been provided regarding the importance of this vaccine. Patient has been advised to call insurance company to determine out of pocket expense if they have not yet received this vaccine. Advised may also receive vaccine at local pharmacy or Health Dept. Verbalized acceptance and understanding.  Screening Tests Health Maintenance  Topic Date Due   Zoster Vaccines- Shingrix (1 of 2) 09[birth year]02/1955   COVID-19 Vaccine (3 - Pfizer risk series) 03/16/2020   DTaP/Tdap/Td (2 - Td or Tdap) 08/28/2022   OPHTHALMOLOGY EXAM  06/17/2023   INFLUENZA VACCINE  07/12/2023   HEMOGLOBIN A1C  10/12/2023   Diabetic kidney evaluation - eGFR measurement  04/10/2024   Diabetic kidney evaluation - Urine ACR  04/10/2024   DEXA SCAN  10/06/2024   Medicare Annual Wellness (AWV)  11/27/2024   Pneumonia Vaccine 34+ Years old  Completed   HPV VACCINES  Aged Out    Health Maintenance  Health Maintenance Due  Topic Date Due   Zoster Vaccines- Shingrix (1 of 2)  09/02/1957   COVID-19 Vaccine (3 - Pfizer risk series) 03/16/2020   DTaP/Tdap/Td (2 - Td or Tdap) 08/28/2022   OPHTHALMOLOGY EXAM  06/17/2023   INFLUENZA VACCINE  07/12/2023   HEMOGLOBIN A1C  10/12/2023    Colorectal cancer screening: No longer required.   Mammogram status: No longer required due to age.  Bone Density status: Completed 10/07/19. Results reflect: Bone density results: OSTEOPENIA. Repeat every 5 years.  Lung Cancer Screening: (Low Dose CT Chest recommended if Age 37-80 years, 20 pack-year currently smoking OR have quit w/in 15years.) does not qualify.   Lung Cancer Screening Referral: n/a  Additional Screening:  Hepatitis C Screening: does not qualify;   Vision Screening: Recommended annual ophthalmology exams for early detection of glaucoma and other disorders of the eye. Is the patient up to date with their annual eye exam?  No  Who is the provider or what is the name of the office in which the patient attends annual eye exams? [facility] If pt is not established with a provider, would they like to be referred to a provider to establish care? No .   Dental Screening: Recommended annual dental exams for proper oral hygiene  Diabetic Foot Exam: Diabetic Foot Exam: Overdue, Pt has been advised about the importance  in completing this exam. Pt is scheduled for diabetic foot exam on 12/24/23 at next OV.  Community Resource Referral / Chronic Care Management: CRR required this visit?  No   CCM required this visit?  No     Plan:     I have personally reviewed and noted the following in the patient's chart:   Medical and social history Use of alcohol, tobacco or illicit drugs  Current medications and supplements including opioid prescriptions. Patient is not currently taking opioid prescriptions. Functional ability and status Nutritional status Physical activity Advanced directives List of other physicians Hospitalizations, surgeries, and ER visits in  previous 12 months Vitals Screenings to include cognitive, depression, and falls Referrals and appointments  In addition, I have reviewed and discussed with patient certain preventive protocols, quality metrics, and best practice recommendations. A written personalized care plan for preventive services as well as general preventive health recommendations were provided to patient.     Tora Kindred, CMA   11/28/2023   After Visit Summary: (MyChart) Due to this being a telephonic visit, the after visit summary with patients personalized plan was offered to patient via MyChart   Nurse Notes:  Patient declined DM & Nutrition education Needs diabetic eye exam, patient declined. Declined covid vaccine Needs Tdap and Shingrix vaccines. Needs DM foot exam at next OV scheduled 12/24/23

## 2023-12-24 ENCOUNTER — Ambulatory Visit: Payer: Medicare Other | Admitting: Family Medicine

## 2023-12-24 VITALS — BP 120/54 | HR 72 | Resp 18 | Ht 66.0 in | Wt 174.0 lb

## 2023-12-24 DIAGNOSIS — E782 Mixed hyperlipidemia: Secondary | ICD-10-CM

## 2023-12-24 DIAGNOSIS — Z23 Encounter for immunization: Secondary | ICD-10-CM

## 2023-12-24 DIAGNOSIS — Z289 Immunization not carried out for unspecified reason: Secondary | ICD-10-CM

## 2023-12-24 DIAGNOSIS — N183 Chronic kidney disease, stage 3 unspecified: Secondary | ICD-10-CM

## 2023-12-24 DIAGNOSIS — E1140 Type 2 diabetes mellitus with diabetic neuropathy, unspecified: Secondary | ICD-10-CM | POA: Diagnosis not present

## 2023-12-24 DIAGNOSIS — Z7984 Long term (current) use of oral hypoglycemic drugs: Secondary | ICD-10-CM

## 2023-12-24 MED ORDER — TETANUS-DIPHTH-ACELL PERTUSSIS 5-2.5-18.5 LF-MCG/0.5 IM SUSY: 0.5000 mL | PREFILLED_SYRINGE | Freq: Once | INTRAMUSCULAR | 0 refills | Status: AC

## 2023-12-24 MED ORDER — SHINGRIX 50 MCG/0.5ML IM SUSR: 0.5000 mL | Freq: Once | INTRAMUSCULAR | 0 refills | Status: AC

## 2023-12-24 NOTE — Progress Notes (Signed)
 Established patient visit   Patient: Teresa Shields   DOB: 1936-12-02   88 y.o. Female  MRN: [id number] Visit Date: 12/24/2023  Today's healthcare provider: Nancyann Perry, MD   Chief Complaint  Patient presents with   Diabetes   Hypertension   Subjective    Diabetes Pertinent negatives for hypoglycemia include no dizziness. Pertinent negatives for diabetes include no chest pain, no fatigue and no weakness.  Hypertension Pertinent negatives include no chest pain, palpitations or shortness of breath.    Follow up dm2, .lipids, htn, and CKD.  Generally doing well, no new complains. Using walker due to difficulty with balance which has not changed. Lab Results  Component Value Date   HGBA1C 6.7 (A) 04/11/2023   Lab Results  Component Value Date   NA 144 04/11/2023   K 5.1 04/11/2023   CREATININE 1.33 (H) 04/11/2023   EGFR 39 (L) 04/11/2023   GLUCOSE 183 (H) 04/11/2023   Lab Results  Component Value Date   CHOL 174 04/11/2023   HDL 35 (L) 04/11/2023   LDLCALC 66 04/11/2023   TRIG 469 (H) 04/11/2023   CHOLHDL 5.0 (H) 04/11/2023     Medications: Outpatient Medications Prior to Visit  Medication Sig   acetaminophen  (TYLENOL ) 500 MG tablet Take 1,000 mg by mouth 2 (two) times daily as needed (for pain.).   allopurinol  (ZYLOPRIM ) 300 MG tablet Take 1 tablet (300 mg total) by mouth daily.   Ascorbic Acid (VITAMIN C) 1000 MG tablet Take 1,000 mg by mouth daily.   aspirin EC 81 MG tablet Take 81 mg by mouth daily.   atorvastatin  (LIPITOR) 40 MG tablet Take 1 tablet (40 mg total) by mouth every evening.   calcium -vitamin D  (OSCAL WITH D) 500-200 MG-UNIT tablet Take 1 tablet by mouth daily with breakfast.    glipiZIDE  (GLUCOTROL  XL) 2.5 MG 24 hr tablet Take 1 tablet (2.5 mg total) by mouth daily with breakfast.   losartan  (COZAAR ) 50 MG tablet Take 1 tablet (50 mg total) by mouth daily.   metoprolol  succinate (TOPROL -XL) 50 MG 24 hr tablet TAKE 1 TABLET EVERY DAY FOR  HIGH BLOOD PRESSURE   Multiple Vitamin (MULTIVITAMIN WITH MINERALS) TABS tablet Take 1 tablet by mouth daily.   Omega-3 Fatty Acids (FISH OIL) 1000 MG CAPS Take 1,000 mg by mouth daily.   pregabalin  (LYRICA ) 75 MG capsule Take 1 capsule (75 mg total) by mouth 3 (three) times daily.   triamterene -hydrochlorothiazide  (MAXZIDE) 75-50 MG tablet Take 1 tablet by mouth daily.   No facility-administered medications prior to visit.    Review of Systems  Constitutional:  Negative for appetite change, chills, fatigue and fever.  Respiratory:  Negative for chest tightness and shortness of breath.   Cardiovascular:  Negative for chest pain and palpitations.  Gastrointestinal:  Negative for abdominal pain, nausea and vomiting.  Neurological:  Negative for dizziness and weakness.       Objective    BP (!) 120/54   Pulse 72   Resp 18   Ht 5' 6 (1.676 m)   Wt 174 lb (78.9 kg)   SpO2 99%   BMI 28.08 kg/m    Physical Exam   General: Appearance:    Well developed, well nourished female in no acute distress  Eyes:    PERRL, conjunctiva/corneas clear, EOM's intact       Lungs:     Clear to auscultation bilaterally, respirations unlabored  Heart:    Normal heart rate. Normal rhythm.  No murmurs, rubs, or gallops.    MS:   All extremities are intact.    Neurologic:   Awake, alert, oriented x 3. No apparent focal neurological defect.        Assessment & Plan     1. Type 2 diabetes mellitus with diabetic neuropathy, without long-term current use of insulin (HCC) (Primary) Doing well current medications.  - TSH - Hemoglobin A1c  2. Stage 3 chronic kidney disease, unspecified whether stage 3a or 3b CKD (HCC)  - Renal function panel  3. Hyperlipidemia, mixed She is tolerating atorvastatin  well with no adverse effects.   - Lipid panel  4. Prescription for Tdap. Vaccine not administered in office.   - Tdap (BOOSTRIX) 5-2.5-18.5 LF-MCG/0.5 injection; Inject 0.5 mLs into the muscle once for  1 dose.  Dispense: 0.5 mL; Refill: 0  5. Prescription for Shingrix . Vaccine not administered in office.   - Zoster Vaccine Adjuvanted (SHINGRIX ) injection; Inject 0.5 mLs into the muscle once for 1 dose.  Dispense: 0.5 mL; Refill: 0   Return in about 4 months (around 04/22/2024).         Nancyann Perry, MD  [facility] [phone] (phone) [phone] (fax)  [facility]

## 2023-12-24 NOTE — Patient Instructions (Signed)
 Teresa Shields  Please review the attached list of medications and notify my office if there are any errors.   . Please bring all of your medications to every appointment so we can make sure that our medication list is the same as yours.

## 2023-12-25 LAB — RENAL FUNCTION PANEL
Albumin: 4.4 g/dL (ref 3.7–4.7)
BUN/Creatinine Ratio: 32 — ABNORMAL HIGH (ref 12–28)
BUN: 50 mg/dL — ABNORMAL HIGH (ref 8–27)
CO2: 20 mmol/L (ref 20–29)
Calcium: 10.3 mg/dL (ref 8.7–10.3)
Chloride: 105 mmol/L (ref 96–106)
Creatinine, Ser: 1.54 mg/dL — ABNORMAL HIGH (ref 0.57–1.00)
Glucose: 148 mg/dL — ABNORMAL HIGH (ref 70–99)
Phosphorus: 3.2 mg/dL (ref 3.0–4.3)
Potassium: 5.2 mmol/L (ref 3.5–5.2)
Sodium: 143 mmol/L (ref 134–144)
eGFR: 33 mL/min/{1.73_m2} — ABNORMAL LOW (ref >59)

## 2023-12-25 LAB — TSH: TSH: 4.12 u[IU]/mL (ref 0.450–4.500)

## 2023-12-25 LAB — LIPID PANEL
Chol/HDL Ratio: 6.3 {ratio} — ABNORMAL HIGH (ref 0.0–4.4)
Cholesterol, Total: 188 mg/dL (ref 100–199)
HDL: 30 mg/dL — ABNORMAL LOW (ref >39)
LDL Chol Calc (NIH): 60 mg/dL (ref 0–99)
Triglycerides: 651 mg/dL (ref 0–149)
VLDL Cholesterol Cal: 98 mg/dL — ABNORMAL HIGH (ref 5–40)

## 2023-12-25 LAB — HEMOGLOBIN A1C
Est. average glucose Bld gHb Est-mCnc: 154 mg/dL
Hgb A1c MFr Bld: 7 % — ABNORMAL HIGH (ref 4.8–5.6)

## 2023-12-30 ENCOUNTER — Other Ambulatory Visit: Payer: Self-pay | Admitting: Family Medicine

## 2023-12-30 ENCOUNTER — Encounter: Payer: Self-pay | Admitting: Family Medicine

## 2023-12-30 DIAGNOSIS — E782 Mixed hyperlipidemia: Secondary | ICD-10-CM

## 2023-12-30 MED ORDER — ICOSAPENT ETHYL 1 G PO CAPS: 2.0000 g | ORAL_CAPSULE | Freq: Two times a day (BID) | ORAL | 1 refills | Status: DC

## 2024-01-07 ENCOUNTER — Telehealth: Payer: Self-pay | Admitting: Family Medicine

## 2024-01-07 ENCOUNTER — Other Ambulatory Visit: Payer: Self-pay

## 2024-01-07 DIAGNOSIS — E782 Mixed hyperlipidemia: Secondary | ICD-10-CM

## 2024-01-07 DIAGNOSIS — E1140 Type 2 diabetes mellitus with diabetic neuropathy, unspecified: Secondary | ICD-10-CM

## 2024-01-07 MED ORDER — ATORVASTATIN CALCIUM 40 MG PO TABS: 40.0000 mg | ORAL_TABLET | Freq: Every evening | ORAL | 4 refills | Status: DC

## 2024-01-07 NOTE — Telephone Encounter (Signed)
[facility] refill request for the following medications:  glipiZIDE (GLUCOTROL XL) 2.5 MG 24 hr tablet    Please advise

## 2024-01-07 NOTE — Telephone Encounter (Signed)
[facility] pharmacy is requesting refill atorvastatin (LIPITOR) 40 MG tablet   Please advise

## 2024-01-09 MED ORDER — GLIPIZIDE ER 2.5 MG PO TB24: 2.5000 mg | ORAL_TABLET | Freq: Every day | ORAL | 4 refills | Status: DC

## 2024-02-12 ENCOUNTER — Telehealth: Payer: Self-pay | Admitting: Family Medicine

## 2024-02-12 ENCOUNTER — Other Ambulatory Visit: Payer: Self-pay

## 2024-02-12 DIAGNOSIS — E1140 Type 2 diabetes mellitus with diabetic neuropathy, unspecified: Secondary | ICD-10-CM

## 2024-02-12 NOTE — Telephone Encounter (Signed)
 [facility] pharmacy is requesting refill pregabalin (LYRICA) 75 MG capsule Please advise

## 2024-02-18 ENCOUNTER — Other Ambulatory Visit: Payer: Self-pay | Admitting: Family Medicine

## 2024-02-18 DIAGNOSIS — E1140 Type 2 diabetes mellitus with diabetic neuropathy, unspecified: Secondary | ICD-10-CM

## 2024-02-18 MED ORDER — PREGABALIN 75 MG PO CAPS: 75.0000 mg | ORAL_CAPSULE | Freq: Three times a day (TID) | ORAL | 0 refills | Status: DC

## 2024-02-18 NOTE — Telephone Encounter (Unsigned)
 Copied from CRM [id number]. Topic: Clinical - Medication Refill >> Feb 18, 2024  2:41 PM Everette C wrote: Most Recent Primary Care Visit:  Provider: [name]  Department: [id number]  Visit Type: OFFICE VISIT  Date: 12/24/2023  Medication: pregabalin (LYRICA) 75 MG capsule [[id number]]  Has the patient contacted their pharmacy? Yes (Agent: If no, request that the patient contact the pharmacy for the refill. If patient does not wish to contact the pharmacy document the reason why and proceed with request.) (Agent: If yes, when and what did the pharmacy advise?)  Is this the correct pharmacy for this prescription? Yes If no, delete pharmacy and type the correct one.  This is the patient's preferred pharmacy:  [facility] - [place], [place] - [address] [place] [address] Phone: [phone] Fax: [phone]   Has the prescription been filled recently? Yes  Is the patient out of the medication? Yes  Has the patient been seen for an appointment in the last year OR does the patient have an upcoming appointment? Yes  Can we respond through MyChart? No  Agent: Please be advised that Rx refills may take up to 3 business days. We ask that you follow-up with your pharmacy.

## 2024-04-04 ENCOUNTER — Telehealth: Payer: Self-pay | Admitting: Family Medicine

## 2024-04-04 DIAGNOSIS — I10 Essential (primary) hypertension: Secondary | ICD-10-CM

## 2024-04-04 NOTE — Telephone Encounter (Signed)
 [facility] faxed refill request for the following medications:   allopurinol  (ZYLOPRIM ) 300 MG tablet   metoprolol  succinate (TOPROL -XL) 50 MG 24 hr tablet   triamterene -hydrochlorothiazide (MAXZIDE) 75-50 MG tablet   losartan  (COZAAR ) 50 MG tablet     Please advise.

## 2024-04-07 ENCOUNTER — Telehealth: Payer: Self-pay | Admitting: Family Medicine

## 2024-04-07 DIAGNOSIS — I10 Essential (primary) hypertension: Secondary | ICD-10-CM

## 2024-04-07 DIAGNOSIS — E1140 Type 2 diabetes mellitus with diabetic neuropathy, unspecified: Secondary | ICD-10-CM

## 2024-04-07 MED ORDER — TRIAMTERENE-HCTZ 75-50 MG PO TABS: 1.0000 | ORAL_TABLET | Freq: Every day | ORAL | 4 refills | Status: DC

## 2024-04-07 MED ORDER — LOSARTAN POTASSIUM 50 MG PO TABS: 50.0000 mg | ORAL_TABLET | Freq: Every day | ORAL | 0 refills | Status: DC

## 2024-04-07 MED ORDER — GLIPIZIDE ER 2.5 MG PO TB24: 2.5000 mg | ORAL_TABLET | Freq: Every day | ORAL | 4 refills | Status: DC

## 2024-04-07 MED ORDER — METOPROLOL SUCCINATE ER 50 MG PO TB24: ORAL_TABLET | ORAL | 4 refills | Status: DC

## 2024-04-07 MED ORDER — ALLOPURINOL 300 MG PO TABS: 300.0000 mg | ORAL_TABLET | Freq: Every day | ORAL | 4 refills | Status: DC

## 2024-04-07 NOTE — Telephone Encounter (Signed)
 Prescription sent in

## 2024-04-07 NOTE — Telephone Encounter (Signed)
 [facility] is requesting refill glipiZIDE  (GLUCOTROL  XL) 2.5 MG 24 hr tablet  Please advise

## 2024-04-07 NOTE — Telephone Encounter (Signed)
 Prescriptions initially sent to [facility] in [place] which it was not the correct one since the request came in from [facility]

## 2024-04-07 NOTE — Telephone Encounter (Signed)
 Last prescription was sent 01/09/24 with qty: 90 R: 4 Pharmacy: [facility]

## 2024-04-25 ENCOUNTER — Ambulatory Visit: Payer: Self-pay | Admitting: Family Medicine

## 2024-04-25 ENCOUNTER — Encounter: Payer: Self-pay | Admitting: Family Medicine

## 2024-04-25 VITALS — BP 127/51 | HR 80 | Temp 97.8°F | Resp 16 | Ht 66.0 in | Wt 174.0 lb

## 2024-04-25 DIAGNOSIS — M2550 Pain in unspecified joint: Secondary | ICD-10-CM | POA: Diagnosis not present

## 2024-04-25 DIAGNOSIS — E782 Mixed hyperlipidemia: Secondary | ICD-10-CM

## 2024-04-25 DIAGNOSIS — E1140 Type 2 diabetes mellitus with diabetic neuropathy, unspecified: Secondary | ICD-10-CM

## 2024-04-25 DIAGNOSIS — I10 Essential (primary) hypertension: Secondary | ICD-10-CM

## 2024-04-25 MED ORDER — PREGABALIN 75 MG PO CAPS: 75.0000 mg | ORAL_CAPSULE | Freq: Three times a day (TID) | ORAL | 1 refills | Status: DC

## 2024-04-25 MED ORDER — HYDROCHLOROTHIAZIDE 25 MG PO TABS: 25.0000 mg | ORAL_TABLET | Freq: Every day | ORAL | 3 refills | Status: DC

## 2024-04-25 NOTE — Progress Notes (Signed)
 Established patient visit   Patient: Teresa Shields   DOB: [birth year]   88 y.o. Female  MRN: [id number] Visit Date: 04/25/2024  Today's healthcare provider: Jeralene Mom, MD   No chief complaint on file.  Subjective    Discussed the use of AI scribe software for clinical note transcription with the patient, who gave verbal consent to proceed.  History of Present Illness   Teresa Shields is an 88 year old female who presents with persistent for follow up diabetes, hypertension, and hyperlipidemia, and reporting pain and swelling in the ankle area following a previous foot injury. She fractured her left ankle in 2019 and had ORIF and reports discoloration above the scar with intermittent swelling and pain in the area ever since. The pain is intermittent, with episodes lasting a few days to a week, followed by periods of relief lasting a couple of weeks. The pain can become severe, accompanied by swelling.  She is currently taking pregabalin  and requests a refill. She also takes multiple medications for blood pressure, including HCTZ, losartan , and metoprolol . She takes half a tablet of maxzide daily and monitors for swelling in her feet. She continues atorvastatin  for cholesterol and had Vascepa  added in January due to persistently high triglycerides.   She has dental issues, specifically two teeth with cavities that have broken, and expresses a need for an oral surgeon referral, although she does not currently have a dentist.     Lab Results  Component Value Date   HGBA1C 7.0 (H) 12/24/2023   HGBA1C 6.7 (A) 04/11/2023   HGBA1C 6.9 (A) 12/06/2022   Lab Results  Component Value Date   NA 143 12/24/2023   K 5.2 12/24/2023   CREATININE 1.54 (H) 12/24/2023   EGFR 33 (L) 12/24/2023   GLUCOSE 148 (H) 12/24/2023   Lab Results  Component Value Date   CHOL 188 12/24/2023   HDL 30 (L) 12/24/2023   LDLCALC 60 12/24/2023   TRIG 651 (HH) 12/24/2023   CHOLHDL 6.3 (H) 12/24/2023      Medications: Outpatient Medications Prior to Visit  Medication Sig Note   acetaminophen  (TYLENOL ) 500 MG tablet Take 1,000 mg by mouth 2 (two) times daily as needed (for pain.).    allopurinol  (ZYLOPRIM ) 300 MG tablet Take 1 tablet (300 mg total) by mouth daily.    Ascorbic Acid (VITAMIN C) 1000 MG tablet Take 1,000 mg by mouth daily.    aspirin EC 81 MG tablet Take 81 mg by mouth daily.    atorvastatin  (LIPITOR) 40 MG tablet Take 1 tablet (40 mg total) by mouth every evening.    calcium -vitamin D  (OSCAL WITH D) 500-200 MG-UNIT tablet Take 1 tablet by mouth daily with breakfast.     glipiZIDE  (GLUCOTROL  XL) 2.5 MG 24 hr tablet Take 1 tablet (2.5 mg total) by mouth daily with breakfast.    icosapent  Ethyl (VASCEPA ) 1 g capsule Take 2 capsules (2 g total) by mouth 2 (two) times daily.    losartan  (COZAAR ) 50 MG tablet Take 1 tablet (50 mg total) by mouth daily.    metoprolol  succinate (TOPROL -XL) 50 MG 24 hr tablet TAKE 1 TABLET EVERY DAY FOR HIGH BLOOD PRESSURE    Multiple Vitamin (MULTIVITAMIN WITH MINERALS) TABS tablet Take 1 tablet by mouth daily.    pregabalin  (LYRICA ) 75 MG capsule Take 1 capsule (75 mg total) by mouth 3 (three) times daily.    triamterene -hydrochlorothiazide (MAXZIDE) 75-50 MG tablet Take 1 tablet by mouth daily. (  Patient taking differently: Take 0.5 tablets by mouth daily.) 04/25/2024: CHANGE TO HCTZ   No facility-administered medications prior to visit.   Review of Systems     Objective    BP (!) 127/51 (BP Location: Left Arm, Patient Position: Sitting, Cuff Size: Normal)   Pulse 80   Temp 97.8 F (36.6 C) (Oral)   Resp 16   Ht 5\' 6"  (1.676 m)   Wt 174 lb (78.9 kg)   SpO2 96%   BMI 28.08 kg/m   Physical Exam   General appearance: Well developed, well nourished female, cooperative and in no acute distress Head: Normocephalic, without obvious abnormality, atraumatic Respiratory: Respirations even and unlabored, normal respiratory rate Extremities:  All extremities are intact.  Skin: dime size slightly tender slightly purpuric discoloration of skin over proximal ankle, just above ORIF scar.  Psych: Appropriate mood and affect. Neurologic: Mental status: Alert, oriented to person, place, and time, thought content appropriate.     Assessment & Plan       Chronic post-surgical wound pain Chronic ankle pain post-surgery since 2019, with intermittent flare-ups. Managed with topical ointment and ice. Dermatology consultation not needed. - Apply ice during flare-ups. - Use topical ointment during flare-ups.  Hypertension Hypertension well-controlled with Maxzide, losartan , and metoprolol . Plan to reduce medication burden by switching from Maxzide to plain HCTZ. - Switch from Maxzide to plain HCT 25mg . - Monitor blood pressure closely. - Use mail order pharmacy for medications.  Gout Gout well-managed with allopurinol , no recent attacks. - Continue allopurinol  regimen.          Jeralene Mom, MD  [facility] [phone] (phone) [phone] (fax)  [facility]

## 2024-04-26 LAB — LIPID PANEL WITH LDL/HDL RATIO
Cholesterol, Total: 174 mg/dL (ref 100–199)
HDL: 32 mg/dL — ABNORMAL LOW (ref >39)
LDL Chol Calc (NIH): 68 mg/dL (ref 0–99)
LDL/HDL Ratio: 2.1 ratio (ref 0.0–3.2)
Triglycerides: 480 mg/dL — ABNORMAL HIGH (ref 0–149)
VLDL Cholesterol Cal: 74 mg/dL — ABNORMAL HIGH (ref 5–40)

## 2024-04-26 LAB — HEMOGLOBIN A1C
Est. average glucose Bld gHb Est-mCnc: 174 mg/dL
Hgb A1c MFr Bld: 7.7 % — ABNORMAL HIGH (ref 4.8–5.6)

## 2024-04-28 ENCOUNTER — Ambulatory Visit: Payer: Self-pay | Admitting: Family Medicine

## 2024-08-27 ENCOUNTER — Encounter: Payer: Self-pay | Admitting: Family Medicine

## 2024-08-27 ENCOUNTER — Ambulatory Visit: Admitting: Family Medicine

## 2024-08-27 VITALS — BP 133/66 | HR 71 | Ht 66.0 in | Wt 175.1 lb

## 2024-08-27 DIAGNOSIS — N1832 Chronic kidney disease, stage 3b: Secondary | ICD-10-CM | POA: Diagnosis not present

## 2024-08-27 DIAGNOSIS — Z23 Encounter for immunization: Secondary | ICD-10-CM

## 2024-08-27 DIAGNOSIS — I10 Essential (primary) hypertension: Secondary | ICD-10-CM

## 2024-08-27 DIAGNOSIS — E1140 Type 2 diabetes mellitus with diabetic neuropathy, unspecified: Secondary | ICD-10-CM

## 2024-08-27 DIAGNOSIS — Z7984 Long term (current) use of oral hypoglycemic drugs: Secondary | ICD-10-CM | POA: Diagnosis not present

## 2024-08-27 LAB — POCT GLYCOSYLATED HEMOGLOBIN (HGB A1C): Hemoglobin A1C: 8.4 % — AB (ref 4.0–5.6)

## 2024-08-27 MED ORDER — SITAGLIPTIN PHOSPHATE 50 MG PO TABS: 50.0000 mg | ORAL_TABLET | Freq: Every day | ORAL | 1 refills | Status: DC

## 2024-08-27 NOTE — Progress Notes (Signed)
 Established patient visit   Patient: Teresa Shields   DOB: 1936-09-13   88 y.o. Female  MRN: [id number] Visit Date: 08/27/2024  Today's healthcare provider: Nancyann Perry, MD   Chief Complaint  Patient presents with   Medical Management of Chronic Issues   Diabetes    Taking medication as prescribed with no side effects and tolerating well. She reports not monitoring as she does not have the supplies to do so. No episodes of hypoglycemia. Reports only some numbness/tingling in her feet   Subjective    Discussed the use of AI scribe software for clinical note transcription with the patient, who gave verbal consent to proceed.  History of Present Illness   Teresa Shields is an 88 year old female with neuropathy who presents for follow up diabetes, hypertension, and diabetic neuropathy.   She experiences significant foot pain, particularly at night, which disrupts her sleep. Previously on gabapentin , she was switched to pregabalin , which she finds more expensive and ineffective in alleviating her symptoms. She describes severe pain in her feet upon waking, stating it 'hurts so bad'. She has a history of nightmares with increased doses of gabapentin  and is concerned about experiencing similar side effects with pregabalin . Living alone, she is apprehensive about increasing the dose due to past experiences with nightmares. She prefers to change back to gabapentin  rather than increasing pregabalin  dose.   She is currently taking glipizide  but does not monitor her blood sugar at home. She has not taken Januvia  before.     Lab Results  Component Value Date   NA 143 12/24/2023   K 5.2 12/24/2023   CREATININE 1.54 (H) 12/24/2023   EGFR 33 (L) 12/24/2023   GLUCOSE 148 (H) 12/24/2023   Lab Results  Component Value Date   CHOL 174 04/25/2024   HDL 32 (L) 04/25/2024   LDLCALC 68 04/25/2024   TRIG 480 (H) 04/25/2024   CHOLHDL 6.3 (H) 12/24/2023   Lab Results  Component Value Date    HGBA1C 8.4 (A) 08/27/2024   HGBA1C 7.7 (H) 04/25/2024   HGBA1C 7.0 (H) 12/24/2023      Medications: Outpatient Medications Prior to Visit  Medication Sig   acetaminophen  (TYLENOL ) 500 MG tablet Take 1,000 mg by mouth 2 (two) times daily as needed (for pain.).   allopurinol  (ZYLOPRIM ) 300 MG tablet Take 1 tablet (300 mg total) by mouth daily.   Ascorbic Acid (VITAMIN C) 1000 MG tablet Take 1,000 mg by mouth daily.   aspirin EC 81 MG tablet Take 81 mg by mouth daily.   atorvastatin  (LIPITOR) 40 MG tablet Take 1 tablet (40 mg total) by mouth every evening.   calcium -vitamin D  (OSCAL WITH D) 500-200 MG-UNIT tablet Take 1 tablet by mouth daily with breakfast.    glipiZIDE  (GLUCOTROL  XL) 2.5 MG 24 hr tablet Take 1 tablet (2.5 mg total) by mouth daily with breakfast.   hydrochlorothiazide  (HYDRODIURIL ) 25 MG tablet Take 1 tablet (25 mg total) by mouth daily.   icosapent  Ethyl (VASCEPA ) 1 g capsule Take 2 capsules (2 g total) by mouth 2 (two) times daily.   losartan  (COZAAR ) 50 MG tablet Take 1 tablet (50 mg total) by mouth daily.   metoprolol  succinate (TOPROL -XL) 50 MG 24 hr tablet TAKE 1 TABLET EVERY DAY FOR HIGH BLOOD PRESSURE   Multiple Vitamin (MULTIVITAMIN WITH MINERALS) TABS tablet Take 1 tablet by mouth daily.   pregabalin  (LYRICA ) 75 MG capsule Take 1 capsule (75 mg total) by mouth  3 (three) times daily.   No facility-administered medications prior to visit.   Review of Systems  Constitutional:  Negative for appetite change, chills, fatigue and fever.  Respiratory:  Negative for chest tightness and shortness of breath.   Cardiovascular:  Negative for chest pain and palpitations.  Gastrointestinal:  Negative for abdominal pain, nausea and vomiting.  Neurological:  Negative for dizziness and weakness.       Objective    BP 133/66 (BP Location: Left Arm, Patient Position: Sitting, Cuff Size: Normal)   Pulse 71   Ht 5' 6 (1.676 m)   Wt 175 lb 1.6 oz (79.4 kg)   SpO2 95%    BMI 28.26 kg/m   Physical Exam   General: Appearance:    Well developed, well nourished female in no acute distress  Eyes:    PERRL, conjunctiva/corneas clear, EOM's intact       Lungs:     Clear to auscultation bilaterally, respirations unlabored  Heart:    Normal heart rate. Normal rhythm. No murmurs, rubs, or gallops.    MS:   All extremities are intact.    Neurologic:   Awake, alert, oriented x 3. No apparent focal neurological defect.        Results for orders placed or performed in visit on 08/27/24  POCT glycosylated hemoglobin (Hb A1C)  Result Value Ref Range   Hemoglobin A1C 8.4 (A) 4.0 - 5.6 %     Assessment & Plan    1. Type 2 diabetes mellitus with diabetic neuropathy, without long-term current use of insulin (HCC) (Primary) A1c is trending up. Will add - sitaGLIPtin  (JANUVIA ) 50 MG tablet; Take 1 tablet (50 mg total) by mouth daily.  Dispense: 90 tablet; Refill: 1. Recheck A1c in 4 months.   Counseled that higher dose of pregabalin  may be more effective for neuropathy, but she does not want to risk similar side effects that she had with higher doses of gabapentin . She prefers go back to her previous dose of gabapentin  when her current bottle of pregabalin  runs out in December.   2. Primary hypertension BP near goal Continue current medications.    3. Stage 3b chronic kidney disease (HCC) Continue current medications.  Check renal panel at follow up.   4. Immunization due  - Flu vaccine HIGH DOSE PF(Fluzone Trivalent)    Return in about 4 months (around 12/27/2024) for Diabetes.     Nancyann Perry, MD  [facility] [phone] (phone) [phone] (fax)  [facility]

## 2024-08-27 NOTE — Patient Instructions (Addendum)
 Please review the attached list of medications and notify my office if there are any errors.   I'll let the pharmacy know to change the pregabalin  back to gabapentin  with your next refill in December.

## 2024-10-02 ENCOUNTER — Telehealth: Payer: Self-pay | Admitting: Family Medicine

## 2024-10-02 ENCOUNTER — Other Ambulatory Visit: Payer: Self-pay

## 2024-10-02 DIAGNOSIS — I10 Essential (primary) hypertension: Secondary | ICD-10-CM

## 2024-10-02 MED ORDER — LOSARTAN POTASSIUM 50 MG PO TABS: 50.0000 mg | ORAL_TABLET | Freq: Every day | ORAL | 0 refills | Status: DC

## 2024-10-02 NOTE — Telephone Encounter (Signed)
 Converted into a refill request and sent in.

## 2024-10-02 NOTE — Telephone Encounter (Signed)
 [facility] mail [facility] faxed refill request for the following medications:   losartan  (COZAAR ) 50 MG tablet    Please advise.

## 2024-11-09 ENCOUNTER — Other Ambulatory Visit: Payer: Self-pay | Admitting: Family Medicine

## 2024-11-09 DIAGNOSIS — E1140 Type 2 diabetes mellitus with diabetic neuropathy, unspecified: Secondary | ICD-10-CM

## 2024-11-09 MED ORDER — GABAPENTIN 100 MG PO CAPS: 100.0000 mg | ORAL_CAPSULE | Freq: Two times a day (BID) | ORAL | 4 refills | Status: DC

## 2024-11-24 ENCOUNTER — Ambulatory Visit

## 2024-11-24 VITALS — Ht 66.0 in | Wt 175.0 lb

## 2024-11-24 DIAGNOSIS — Z0000 Encounter for general adult medical examination without abnormal findings: Secondary | ICD-10-CM | POA: Diagnosis not present

## 2024-11-24 NOTE — Progress Notes (Signed)
 Chief Complaint  Patient presents with   Medicare Wellness     Subjective:   Teresa Shields is a 88 y.o. female who presents for a Medicare Annual Wellness Visit.  Visit info / Clinical Intake: Medicare Wellness Visit Type:: Subsequent Annual Wellness Visit Persons participating in visit and providing information:: patient Medicare Wellness Visit Mode:: Telephone If telephone:: video declined Since this visit was completed virtually, some vitals may be partially provided or unavailable. Missing vitals are due to the limitations of the virtual format.: Unable to obtain vitals - no equipment If Telephone or Video please confirm:: I connected with patient using audio/video enable telemedicine. I verified patient identity with two identifiers, discussed telehealth limitations, and patient agreed to proceed. Patient Location:: Home Provider Location:: HOME Interpreter Needed?: No Pre-visit prep was completed: yes AWV questionnaire completed by patient prior to visit?: yes Date:: 11/17/24 Living arrangements:: (!) (Patient-Rptd) lives alone Patient's Overall Health Status Rating: (!) (Patient-Rptd) fair Typical amount of pain: (Patient-Rptd) some Does pain affect daily life?: (!) (Patient-Rptd) yes Are you currently prescribed opioids?: no  Dietary Habits and Nutritional Risks How many meals a day?: (Patient-Rptd) 2 Eats fruit and vegetables daily?: (Patient-Rptd) yes Most meals are obtained by: (Patient-Rptd) preparing own meals; having others provide food Diabetic:: (!) yes Any non-healing wounds?: no How often do you check your BS?: 0 Would you like to be referred to a Nutritionist or for Diabetic Management? : no  Functional Status Activities of Daily Living (to include ambulation/medication): (!) (Patient-Rptd) Needs Assist Feeding: Independent Dressing/Grooming: Independent Bathing: Independent Toileting: Independent Transfer: Independent Ambulation: Independent with  device- listed below Home Assistive Devices/Equipment: Walker (specify Type) Medication Administration: (Patient-Rptd) Needs assistance (comment) Home Management (perform basic housework or laundry): (Patient-Rptd) Independent Manage your own finances?: (Patient-Rptd) yes Primary transportation is: (Patient-Rptd) family / friends Concerns about vision?: no *vision screening is required for WTM* Concerns about hearing?: no  Fall Screening Falls in the past year?: (Patient-Rptd) 0 Number of falls in past year: 0 Was there an injury with Fall?: 0 Fall Risk Category Calculator: 0 Patient Fall Risk Level: Low Fall Risk  Fall Risk Patient at Risk for Falls Due to: No Fall Risks Fall risk Follow up: Falls evaluation completed; Falls prevention discussed  Home and Transportation Safety: All rugs have non-skid backing?: (Patient-Rptd) yes All stairs or steps have railings?: (Patient-Rptd) yes Grab bars in the bathtub or shower?: (Patient-Rptd) yes Have non-skid surface in bathtub or shower?: (Patient-Rptd) yes Good home lighting?: (Patient-Rptd) yes Regular seat belt use?: (Patient-Rptd) yes Hospital stays in the last year:: (Patient-Rptd) no  Cognitive Assessment Difficulty concentrating, remembering, or making decisions? : (Patient-Rptd) no Will 6CIT or Mini Cog be Completed: no 6CIT or Mini Cog Declined: patient declined What year is it?: 0 points What month is it?: 0 points Give patient an address phrase to remember (5 components): [address], [address], [place] About what time is it?: 0 points Count backwards from 20 to 1: 0 points Say the months of the year in reverse: 0 points Repeat the address phrase from earlier: 0 points 6 CIT Score: 0 points  Advance Directives (For Healthcare) Does Patient Have a Medical Advance Directive?: No Would patient like information on creating a medical advance directive?: Yes (MAU/Ambulatory/Procedural Areas - Information  given)  Reviewed/Updated  Reviewed/Updated: Reviewed All (Medical, Surgical, Family, Medications, Allergies, Care Teams, Patient Goals)    Allergies (verified) Metformin  and related and Naproxen   Current Medications (verified) Outpatient Encounter Medications as of  11/24/2024  Medication Sig   acetaminophen  (TYLENOL ) 500 MG tablet Take 1,000 mg by mouth 2 (two) times daily as needed (for pain.).   allopurinol  (ZYLOPRIM ) 300 MG tablet Take 1 tablet (300 mg total) by mouth daily.   Ascorbic Acid (VITAMIN C) 1000 MG tablet Take 1,000 mg by mouth daily.   aspirin EC 81 MG tablet Take 81 mg by mouth daily.   atorvastatin  (LIPITOR) 40 MG tablet Take 1 tablet (40 mg total) by mouth every evening.   calcium -vitamin D  (OSCAL WITH D) 500-200 MG-UNIT tablet Take 1 tablet by mouth daily with breakfast.    gabapentin  (NEURONTIN ) 100 MG capsule Take 1 capsule (100 mg total) by mouth 2 (two) times daily. TAKE IN THE PLACE OF PREGABALIN    glipiZIDE  (GLUCOTROL  XL) 2.5 MG 24 hr tablet Take 1 tablet (2.5 mg total) by mouth daily with breakfast.   hydrochlorothiazide  (HYDRODIURIL ) 25 MG tablet Take 1 tablet (25 mg total) by mouth daily.   icosapent  Ethyl (VASCEPA ) 1 g capsule Take 2 capsules (2 g total) by mouth 2 (two) times daily.   losartan  (COZAAR ) 50 MG tablet Take 1 tablet (50 mg total) by mouth daily.   metoprolol  succinate (TOPROL -XL) 50 MG 24 hr tablet TAKE 1 TABLET EVERY DAY FOR HIGH BLOOD PRESSURE   Multiple Vitamin (MULTIVITAMIN WITH MINERALS) TABS tablet Take 1 tablet by mouth daily.   sitaGLIPtin  (JANUVIA ) 50 MG tablet Take 1 tablet (50 mg total) by mouth daily.   triamterene -hydrochlorothiazide  (MAXZIDE) 75-50 MG tablet Take 1 tablet by mouth daily.   No facility-administered encounter medications on file as of 11/24/2024.    History: Past Medical History:  Diagnosis Date   Gout    History of chicken pox    Hypertension    Past Surgical History:  Procedure Laterality Date   Bone  Density Study  01/08/2011   T-1.2, L-Spine, T-1.9 Osteopenia   CARPAL TUNNEL RELEASE Right    CATARACT EXTRACTION, BILATERAL     Left hip skin cancer resection  2010   Dermatology   ORIF ANKLE FRACTURE Right 04/11/2018   Procedure: OPEN REDUCTION INTERNAL FIXATION (ORIF) ANKLE FRACTURE;  Surgeon: Kathlynn Sharper, MD;  Location: [facility] ORS;  Service: Orthopedics;  Laterality: Right;   TUBAL LIGATION     Family History  Problem Relation Age of Onset   Healthy Mother 48   Healthy Father 44   AAA (abdominal aortic aneurysm) Sister    Gout Other    Social History   Occupational History   Occupation: retired  Tobacco Use   Smoking status: Never   Smokeless tobacco: Never  Vaping Use   Vaping status: Never Used  Substance and Sexual Activity   Alcohol use: No   Drug use: No   Sexual activity: Not on file   Tobacco Counseling Counseling given: Not Answered  SDOH Screenings   Food Insecurity: No Food Insecurity (11/17/2024)  Housing: Unknown (11/17/2024)  Transportation Needs: No Transportation Needs (11/17/2024)  Utilities: Not At Risk (11/24/2024)  Alcohol Screen: Low Risk (11/28/2023)  Depression (PHQ2-9): Low Risk (11/24/2024)  Financial Resource Strain: Low Risk (11/17/2024)  Physical Activity: Inactive (11/17/2024)  Social Connections: Moderately Isolated (11/17/2024)  Stress: No Stress Concern Present (11/17/2024)  Tobacco Use: Low Risk (11/24/2024)  Health Literacy: Adequate Health Literacy (11/24/2024)   See flowsheets for full screening details  Depression Screen PHQ 2 & 9 Depression Scale- Over the past 2 weeks, how often have you been bothered by any of the following problems? Little interest or  pleasure in doing things: 0 Feeling down, depressed, or hopeless (PHQ Adolescent also includes...irritable): 0 PHQ-2 Total Score: 0 Trouble falling or staying asleep, or sleeping too much: 0 Feeling tired or having little energy: 0 Poor appetite or overeating (PHQ Adolescent  also includes...weight loss): 0 Feeling bad about yourself - or that you are a failure or have let yourself or your family down: 0 Trouble concentrating on things, such as reading the newspaper or watching television (PHQ Adolescent also includes...like school work): 0 Moving or speaking so slowly that other people could have noticed. Or the opposite - being so fidgety or restless that you have been moving around a lot more than usual: 0 Thoughts that you would be better off dead, or of hurting yourself in some way: 0 PHQ-9 Total Score: 0 If you checked off any problems, how difficult have these problems made it for you to do your work, take care of things at home, or get along with other people?: Not difficult at all  Depression Treatment Depression Interventions/Treatment : EYV7-0 Score <4 Follow-up Not Indicated     Goals Addressed               This Visit's Progress     Patient Stated (pt-stated)        Not at this time/2025             Objective:    Today's Vitals   11/24/24 1503  Weight: 175 lb (79.4 kg)  Height: 5' 6 (1.676 m)   Body mass index is 28.25 kg/m.  Hearing/Vision screen Hearing Screening - Comments:: Denies hearing difficulties   Vision Screening - Comments:: Had cataract surgery/[facility]/UTD Immunizations and Health Maintenance Health Maintenance  Topic Date Due   COVID-19 Vaccine (3 - Pfizer risk series) 03/16/2020   OPHTHALMOLOGY EXAM  06/17/2023   Zoster Vaccines- Shingrix  (2 of 2) 02/26/2024   Bone Density Scan  10/06/2024   HEMOGLOBIN A1C  02/24/2025   Medicare Annual Wellness (AWV)  11/24/2025   DTaP/Tdap/Td (3 - Td or Tdap) 12/31/2033   Pneumococcal Vaccine: 50+ Years  Completed   Influenza Vaccine  Completed   Meningococcal B Vaccine  Aged Out        Assessment/Plan:  This is a routine wellness examination for Teresa Shields.  Patient Care Team: Gasper Nancyann BRAVO, MD as PCP - General (Family Medicine) [facility], [facility]  [facility])  I have personally reviewed and noted the following in the patients chart:   Medical and social history Use of alcohol, tobacco or illicit drugs  Current medications and supplements including opioid prescriptions. Functional ability and status Nutritional status Physical activity Advanced directives List of other physicians Hospitalizations, surgeries, and ER visits in previous 12 months Vitals Screenings to include cognitive, depression, and falls Referrals and appointments  No orders of the defined types were placed in this encounter.  In addition, I have reviewed and discussed with patient certain preventive protocols, quality metrics, and best practice recommendations. A written personalized care plan for preventive services as well as general preventive health recommendations were provided to patient.   Dominica Kent L Quayshaun Hubbert, CMA   11/24/2024   Return in 1 year (on 11/24/2025).  After Visit Summary: (MyChart) Due to this being a telephonic visit, the after visit summary with patients personalized plan was offered to patient via MyChart   Nurse Notes: Patient is due for a diabetic eye exam.  She declines the DEXA.  Patient had no concerns to address today.

## 2024-11-24 NOTE — Patient Instructions (Addendum)
 [name],  Thank you for taking the time for your Medicare Wellness Visit. I appreciate your continued commitment to your health goals. Please review the care plan we discussed, and feel free to reach out if I can assist you further.  Please note that Annual Wellness Visits do not include a physical exam. Some assessments may be limited, especially if the visit was conducted virtually. If needed, we may recommend an in-person follow-up with your provider.  Ongoing Care Seeing your primary care provider every 3 to 6 months helps us  monitor your health and provide consistent, personalized care. Next office visit on 12/26/2024.  You are due for a diabetic eye exam.  Please have office to send your eye exam results to your provider.  Each day, aim for 6 glasses of water, plenty of protein in your diet and try to get up and walk/ stretch every hour for 5-10 minutes at a time.    Referrals If a referral was made during today's visit and you haven't received any updates within two weeks, please contact the referred provider directly to check on the status.  Recommended Screenings:  Health Maintenance  Topic Date Due   Zoster (Shingles) Vaccine (1 of 2) 09/02/1957   COVID-19 Vaccine (3 - Pfizer risk series) 03/16/2020   DTaP/Tdap/Td vaccine (2 - Td or Tdap) 08/28/2022   Eye exam for diabetics  06/17/2023   Osteoporosis screening with Bone Density Scan  10/06/2024   Medicare Annual Wellness Visit  11/27/2024   Hemoglobin A1C  02/24/2025   Pneumococcal Vaccine for age over 70  Completed   Flu Shot  Completed   Meningitis B Vaccine  Aged Out       11/17/2024   11:23 AM  Advanced Directives  Does Patient Have a Medical Advance Directive? No    Vision: Annual vision screenings are recommended for early detection of glaucoma, cataracts, and diabetic retinopathy. These exams can also reveal signs of chronic conditions such as diabetes and high blood pressure.  Dental: Annual dental screenings  help detect early signs of oral cancer, gum disease, and other conditions linked to overall health, including heart disease and diabetes.  Please see the attached documents for additional preventive care recommendations.

## 2024-12-10 ENCOUNTER — Ambulatory Visit

## 2024-12-26 ENCOUNTER — Ambulatory Visit: Admitting: Family Medicine

## 2024-12-26 ENCOUNTER — Encounter: Payer: Self-pay | Admitting: Family Medicine

## 2024-12-26 VITALS — BP 129/49 | HR 67 | Resp 16 | Ht 66.0 in | Wt 175.0 lb

## 2024-12-26 DIAGNOSIS — Z7984 Long term (current) use of oral hypoglycemic drugs: Secondary | ICD-10-CM

## 2024-12-26 DIAGNOSIS — I10 Essential (primary) hypertension: Secondary | ICD-10-CM | POA: Diagnosis not present

## 2024-12-26 DIAGNOSIS — E1140 Type 2 diabetes mellitus with diabetic neuropathy, unspecified: Secondary | ICD-10-CM | POA: Diagnosis not present

## 2024-12-26 DIAGNOSIS — E876 Hypokalemia: Secondary | ICD-10-CM

## 2024-12-26 DIAGNOSIS — E790 Hyperuricemia without signs of inflammatory arthritis and tophaceous disease: Secondary | ICD-10-CM

## 2024-12-26 DIAGNOSIS — M8580 Other specified disorders of bone density and structure, unspecified site: Secondary | ICD-10-CM

## 2024-12-26 DIAGNOSIS — N1832 Chronic kidney disease, stage 3b: Secondary | ICD-10-CM

## 2024-12-26 DIAGNOSIS — Z23 Encounter for immunization: Secondary | ICD-10-CM

## 2024-12-26 DIAGNOSIS — E782 Mixed hyperlipidemia: Secondary | ICD-10-CM

## 2024-12-26 MED ORDER — HYDROCHLOROTHIAZIDE 25 MG PO TABS: 25.0000 mg | ORAL_TABLET | Freq: Every day | ORAL | 3 refills | Status: AC

## 2024-12-26 MED ORDER — LOSARTAN POTASSIUM 50 MG PO TABS: 50.0000 mg | ORAL_TABLET | Freq: Every day | ORAL | 3 refills | Status: AC

## 2024-12-26 MED ORDER — ALLOPURINOL 300 MG PO TABS: 300.0000 mg | ORAL_TABLET | Freq: Every day | ORAL | 4 refills | Status: AC

## 2024-12-26 MED ORDER — METOPROLOL SUCCINATE ER 50 MG PO TB24: ORAL_TABLET | ORAL | 4 refills | Status: AC

## 2024-12-26 MED ORDER — ATORVASTATIN CALCIUM 40 MG PO TABS: 40.0000 mg | ORAL_TABLET | Freq: Every evening | ORAL | 4 refills | Status: AC

## 2024-12-26 MED ORDER — GLIPIZIDE ER 2.5 MG PO TB24: 2.5000 mg | ORAL_TABLET | Freq: Every day | ORAL | 4 refills | Status: AC

## 2024-12-26 NOTE — Patient Instructions (Signed)
 SABRA  Please review the attached list of medications and notify my office if there are any errors.   . Please bring all of your medications to every appointment so we can make sure that our medication list is the same as yours.

## 2024-12-26 NOTE — Progress Notes (Signed)
 "     Established patient visit   Patient: Teresa Shields   DOB: Nov 03, 1936   89 y.o. Female  MRN: [id number] Visit Date: 12/26/2024  Today's healthcare provider: Nancyann Perry, MD   Chief Complaint  Patient presents with   Follow-up    4 months f/u   Subjective    Discussed the use of AI scribe software for clinical note transcription with the patient, who gave verbal consent to proceed.  History of Present Illness   Teresa Shields is an 89 year old female who presents for a routine checkup and monitoring of her blood sugar and cholesterol levels.  She does not monitor her blood sugar at home and has not experienced symptoms of hypoglycemia. She adheres to her medication regimen and feels her blood pressure remains stable with medication. She occasionally checks her blood pressure at home.  She experiences shortness of breath with extensive walking but denies any chest pain. She has not had an eye examination in over a year and had a negative experience at her last visit, where she was not provided with glasses and felt disoriented upon leaving.  She reports swelling in her feet and mentions neuropathy. She also mentions balance problems, particularly with her knees and hip, and uses a walker to prevent falls, stating, 'If I didn't use that walker, I'd be falling.'  She has recently changed her pharmacy to [facility] and needs all her prescriptions sent there.  .   Lab Results  Component Value Date   CHOL 174 04/25/2024   HDL 32 (L) 04/25/2024   LDLCALC 68 04/25/2024   TRIG 480 (H) 04/25/2024   CHOLHDL 6.3 (H) 12/24/2023   Lab Results  Component Value Date   NA 143 12/24/2023   K 5.2 12/24/2023   CREATININE 1.54 (H) 12/24/2023   EGFR 33 (L) 12/24/2023   GLUCOSE 148 (H) 12/24/2023   Lab Results  Component Value Date   VD25OH 37.4 04/11/2023   Lab Results  Component Value Date   HGBA1C 8.4 (A) 08/27/2024   HGBA1C 7.7 (H) 04/25/2024   HGBA1C 7.0 (H)  12/24/2023    Medications: Outpatient Medications Prior to Visit  Medication Sig   acetaminophen  (TYLENOL ) 500 MG tablet Take 1,000 mg by mouth 2 (two) times daily as needed (for pain.).   allopurinol  (ZYLOPRIM ) 300 MG tablet Take 1 tablet (300 mg total) by mouth daily.   Ascorbic Acid (VITAMIN C) 1000 MG tablet Take 1,000 mg by mouth daily.   aspirin EC 81 MG tablet Take 81 mg by mouth daily.   atorvastatin  (LIPITOR) 40 MG tablet Take 1 tablet (40 mg total) by mouth every evening.   calcium -vitamin D  (OSCAL WITH D) 500-200 MG-UNIT tablet Take 1 tablet by mouth daily with breakfast.    gabapentin  (NEURONTIN ) 100 MG capsule Take 1 capsule (100 mg total) by mouth 2 (two) times daily. TAKE IN THE PLACE OF PREGABALIN    glipiZIDE  (GLUCOTROL  XL) 2.5 MG 24 hr tablet Take 1 tablet (2.5 mg total) by mouth daily with breakfast.   hydrochlorothiazide  (HYDRODIURIL ) 25 MG tablet Take 1 tablet (25 mg total) by mouth daily.   icosapent  Ethyl (VASCEPA ) 1 g capsule Take 2 capsules (2 g total) by mouth 2 (two) times daily. (Not taking = 12/26/2024)   losartan  (COZAAR ) 50 MG tablet Take 1 tablet (50 mg total) by mouth daily.   metoprolol  succinate (TOPROL -XL) 50 MG 24 hr tablet TAKE 1 TABLET EVERY DAY FOR HIGH BLOOD PRESSURE  Multiple Vitamin (MULTIVITAMIN WITH MINERALS) TABS tablet Take 1 tablet by mouth daily.   sitaGLIPtin  (JANUVIA ) 50 MG tablet Take 1 tablet (50 mg total) by mouth daily.   triamterene -hydrochlorothiazide  (MAXZIDE) 75-50 MG tablet Take 1 tablet by mouth daily.   No facility-administered medications prior to visit.        Objective    BP (!) 129/49 (BP Location: Left Arm, Patient Position: Sitting, Cuff Size: Large)   Pulse 67   Resp 16   Ht 5' 6 (1.676 m)   Wt 175 lb (79.4 kg)   SpO2 98%   BMI 28.25 kg/m   Physical Exam   General: Appearance:    Well developed, well nourished female in no acute distress  Eyes:    PERRL, conjunctiva/corneas clear, EOM's intact       Lungs:      Clear to auscultation bilaterally, respirations unlabored  Heart:    Normal heart rate. Normal rhythm. No murmurs, rubs, or gallops.    MS:   All extremities are intact.    Neurologic:   Awake, alert, oriented x 3. No apparent focal neurological defect.          Assessment & Plan      1. Primary hypertension Cammie well controlled. Continue current medications.   - CBC - Comprehensive metabolic panel with GFR - Magnesium  2. Type 2 diabetes mellitus with diabetic neuropathy, without long-term current use of insulin (HCC) Doing well current medications.  - Hemoglobin A1c  3. Osteopenia, unspecified location  - DG Bone Density; Future - VITAMIN D  25 Hydroxy (Vit-D Deficiency, Fractures)  4. Stage 3b chronic kidney disease (HCC) Stable on current medications, checking labs today   5. Hypokalemia Checking today.   6. Hyperlipidemia, mixed She is tolerating atorvastatin  well with no adverse effects.   - Lipid panel      Nancyann Perry, MD  [facility] [phone] (phone) [phone] (fax)  [facility]  "

## 2024-12-27 LAB — CBC
Hematocrit: 42.4 % (ref 34.0–46.6)
Hemoglobin: 14.3 g/dL (ref 11.1–15.9)
MCH: 32.6 pg (ref 26.6–33.0)
MCHC: 33.7 g/dL (ref 31.5–35.7)
MCV: 97 fL (ref 79–97)
Platelets: 275 x10E3/uL (ref 150–450)
RBC: 4.38 x10E6/uL (ref 3.77–5.28)
RDW: 14 % (ref 11.7–15.4)
WBC: 16 x10E3/uL — ABNORMAL HIGH (ref 3.4–10.8)

## 2024-12-27 LAB — COMPREHENSIVE METABOLIC PANEL WITH GFR
ALT: 27 IU/L (ref 0–32)
AST: 21 IU/L (ref 0–40)
Albumin: 4.2 g/dL (ref 3.7–4.7)
Alkaline Phosphatase: 98 IU/L (ref 48–129)
BUN/Creatinine Ratio: 23 (ref 12–28)
BUN: 35 mg/dL — ABNORMAL HIGH (ref 8–27)
Bilirubin Total: 0.5 mg/dL (ref 0.0–1.2)
CO2: 23 mmol/L (ref 20–29)
Calcium: 10.2 mg/dL (ref 8.7–10.3)
Chloride: 101 mmol/L (ref 96–106)
Creatinine, Ser: 1.53 mg/dL — ABNORMAL HIGH (ref 0.57–1.00)
Globulin, Total: 2.2 g/dL (ref 1.5–4.5)
Glucose: 187 mg/dL — ABNORMAL HIGH (ref 70–99)
Potassium: 4.5 mmol/L (ref 3.5–5.2)
Sodium: 142 mmol/L (ref 134–144)
Total Protein: 6.4 g/dL (ref 6.0–8.5)
eGFR: 33 mL/min/1.73 — ABNORMAL LOW

## 2024-12-27 LAB — LIPID PANEL
Chol/HDL Ratio: 5.4 ratio — ABNORMAL HIGH (ref 0.0–4.4)
Cholesterol, Total: 162 mg/dL (ref 100–199)
HDL: 30 mg/dL — ABNORMAL LOW
LDL Chol Calc (NIH): 58 mg/dL (ref 0–99)
Triglycerides: 490 mg/dL — ABNORMAL HIGH (ref 0–149)
VLDL Cholesterol Cal: 74 mg/dL — ABNORMAL HIGH (ref 5–40)

## 2024-12-27 LAB — HEMOGLOBIN A1C
Est. average glucose Bld gHb Est-mCnc: 171 mg/dL
Hgb A1c MFr Bld: 7.6 % — ABNORMAL HIGH (ref 4.8–5.6)

## 2024-12-27 LAB — MAGNESIUM: Magnesium: 1.3 mg/dL — ABNORMAL LOW (ref 1.6–2.3)

## 2024-12-27 LAB — VITAMIN D 25 HYDROXY (VIT D DEFICIENCY, FRACTURES): Vit D, 25-Hydroxy: 27.4 ng/mL — ABNORMAL LOW (ref 30.0–100.0)

## 2024-12-28 ENCOUNTER — Ambulatory Visit: Payer: Self-pay | Admitting: Family Medicine

## 2024-12-28 DIAGNOSIS — D72829 Elevated white blood cell count, unspecified: Secondary | ICD-10-CM | POA: Insufficient documentation

## 2024-12-28 DIAGNOSIS — E782 Mixed hyperlipidemia: Secondary | ICD-10-CM

## 2024-12-31 ENCOUNTER — Encounter: Payer: Self-pay | Admitting: Family Medicine

## 2024-12-31 DIAGNOSIS — E1140 Type 2 diabetes mellitus with diabetic neuropathy, unspecified: Secondary | ICD-10-CM

## 2024-12-31 DIAGNOSIS — E782 Mixed hyperlipidemia: Secondary | ICD-10-CM

## 2025-01-01 MED ORDER — ICOSAPENT ETHYL 1 G PO CAPS: 2.0000 g | ORAL_CAPSULE | Freq: Two times a day (BID) | ORAL | 4 refills | Status: AC

## 2025-01-01 MED ORDER — GABAPENTIN 100 MG PO CAPS: 100.0000 mg | ORAL_CAPSULE | Freq: Two times a day (BID) | ORAL | 4 refills | Status: AC

## 2025-01-01 MED ORDER — SITAGLIPTIN PHOSPHATE 50 MG PO TABS: 50.0000 mg | ORAL_TABLET | Freq: Every day | ORAL | 4 refills | Status: AC

## 2025-02-09 ENCOUNTER — Ambulatory Visit: Admitting: Family Medicine

## 2025-04-27 ENCOUNTER — Ambulatory Visit: Admitting: Family Medicine
